# Patient Record
Sex: Male | Born: 1940 | Race: Black or African American | Hispanic: No | Marital: Married | Smoking: Never smoker
Health system: Southern US, Community
[De-identification: ages and names within clinical notes are randomized; demographics above are authoritative.]

## PROBLEM LIST (undated history)

## (undated) DIAGNOSIS — M1990 Unspecified osteoarthritis, unspecified site: Secondary | ICD-10-CM

## (undated) DIAGNOSIS — C801 Malignant (primary) neoplasm, unspecified: Secondary | ICD-10-CM

## (undated) DIAGNOSIS — R112 Nausea with vomiting, unspecified: Secondary | ICD-10-CM

## (undated) DIAGNOSIS — Z9889 Other specified postprocedural states: Secondary | ICD-10-CM

## (undated) DIAGNOSIS — R350 Frequency of micturition: Secondary | ICD-10-CM

## (undated) DIAGNOSIS — J45909 Unspecified asthma, uncomplicated: Secondary | ICD-10-CM

## (undated) DIAGNOSIS — Z923 Personal history of irradiation: Secondary | ICD-10-CM

## (undated) DIAGNOSIS — I10 Essential (primary) hypertension: Secondary | ICD-10-CM

## (undated) HISTORY — PX: FRACTURE SURGERY: SHX138

## (undated) HISTORY — PX: JOINT REPLACEMENT: SHX530

## (undated) HISTORY — PX: TONSILLECTOMY: SUR1361

---

## 1997-10-07 ENCOUNTER — Encounter: Payer: Self-pay | Admitting: Emergency Medicine

## 1997-10-07 ENCOUNTER — Emergency Department (HOSPITAL_COMMUNITY): Admission: EM | Admit: 1997-10-07 | Discharge: 1997-10-07 | Payer: Self-pay | Admitting: Emergency Medicine

## 1998-10-27 ENCOUNTER — Encounter: Payer: Self-pay | Admitting: Cardiology

## 1998-10-27 ENCOUNTER — Ambulatory Visit (HOSPITAL_COMMUNITY): Admission: RE | Admit: 1998-10-27 | Discharge: 1998-10-27 | Payer: Self-pay | Admitting: Cardiology

## 2001-03-21 ENCOUNTER — Ambulatory Visit (HOSPITAL_COMMUNITY): Admission: RE | Admit: 2001-03-21 | Discharge: 2001-03-21 | Payer: Self-pay | Admitting: Cardiology

## 2001-03-21 ENCOUNTER — Encounter: Payer: Self-pay | Admitting: Cardiology

## 2002-01-28 ENCOUNTER — Encounter: Payer: Self-pay | Admitting: Emergency Medicine

## 2002-01-28 ENCOUNTER — Emergency Department (HOSPITAL_COMMUNITY): Admission: EM | Admit: 2002-01-28 | Discharge: 2002-01-28 | Payer: Self-pay | Admitting: Emergency Medicine

## 2003-04-13 ENCOUNTER — Ambulatory Visit (HOSPITAL_COMMUNITY): Admission: RE | Admit: 2003-04-13 | Discharge: 2003-04-13 | Payer: Self-pay | Admitting: Cardiology

## 2003-04-16 ENCOUNTER — Ambulatory Visit (HOSPITAL_BASED_OUTPATIENT_CLINIC_OR_DEPARTMENT_OTHER): Admission: RE | Admit: 2003-04-16 | Discharge: 2003-04-16 | Payer: Self-pay | Admitting: Cardiology

## 2003-12-17 ENCOUNTER — Ambulatory Visit (HOSPITAL_BASED_OUTPATIENT_CLINIC_OR_DEPARTMENT_OTHER): Admission: RE | Admit: 2003-12-17 | Discharge: 2003-12-17 | Payer: Self-pay | Admitting: Cardiology

## 2004-09-22 ENCOUNTER — Encounter: Admission: RE | Admit: 2004-09-22 | Discharge: 2004-09-22 | Payer: Self-pay | Admitting: Orthopedic Surgery

## 2005-07-03 ENCOUNTER — Inpatient Hospital Stay (HOSPITAL_COMMUNITY): Admission: RE | Admit: 2005-07-03 | Discharge: 2005-07-11 | Payer: Self-pay | Admitting: Orthopedic Surgery

## 2005-07-06 ENCOUNTER — Ambulatory Visit: Payer: Self-pay | Admitting: Cardiology

## 2005-07-06 ENCOUNTER — Encounter: Payer: Self-pay | Admitting: Cardiology

## 2005-07-09 ENCOUNTER — Encounter: Payer: Self-pay | Admitting: Vascular Surgery

## 2005-07-09 ENCOUNTER — Ambulatory Visit: Payer: Self-pay | Admitting: Physical Medicine & Rehabilitation

## 2006-05-16 ENCOUNTER — Encounter: Admission: RE | Admit: 2006-05-16 | Discharge: 2006-05-16 | Payer: Self-pay | Admitting: Orthopedic Surgery

## 2007-04-08 ENCOUNTER — Encounter: Admission: RE | Admit: 2007-04-08 | Discharge: 2007-04-08 | Payer: Self-pay | Admitting: Orthopedic Surgery

## 2007-11-11 ENCOUNTER — Ambulatory Visit (HOSPITAL_COMMUNITY): Admission: RE | Admit: 2007-11-11 | Discharge: 2007-11-11 | Payer: Self-pay | Admitting: Cardiology

## 2007-11-11 ENCOUNTER — Encounter (INDEPENDENT_AMBULATORY_CARE_PROVIDER_SITE_OTHER): Payer: Self-pay | Admitting: Cardiology

## 2007-11-11 ENCOUNTER — Ambulatory Visit: Payer: Self-pay | Admitting: Vascular Surgery

## 2009-09-02 ENCOUNTER — Ambulatory Visit: Admission: RE | Admit: 2009-09-02 | Discharge: 2009-09-23 | Payer: Self-pay | Admitting: Radiation Oncology

## 2010-05-19 NOTE — Discharge Summary (Signed)
NAMELYNDLE, PANG               ACCOUNT NO.:  [id number]   MEDICAL RECORD NO.:  [id number]          PATIENT TYPE:  INP   LOCATION:  5037                         FACILITY:  [facility]   PHYSICIAN:  Elana Alm. Thurston Hole, M.D. 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   HISTORY OF PRESENT ILLNESS:  The patient is a 70 year old black male with a  history of end-stage DJD of his right hip.  He has failed conservative care,  including anti-inflammatories and articular cortisone injections, without  success.  At this point in time, he has pain at night, pain at rest, pain  unrelieved by anti-inflammatories.  He understands the risks, benefits, and  possible complications of a right total hip replacement and is without  question.   PROCEDURES IN HOUSE:  On July 03, 2005, the patient underwent a right total  hip replacement by Dr. Thurston Hole.  He tolerated the procedure well and was  admitted for pain control, DVT prophylaxis, and physical therapy.  Postop  day #1, he had minimal pain.  He was very sleepy.  T-max was 102.4.  He was  metabolically stable.  O2 sats were 100% on 2 liters.  His wound was cleaned  and dressed.  He did have some weakness in his peroneal tendon evertors, but  had functioning FHL, EHL, and brisk capillary refill.  The patient was given  a fluid bolus, as his blood pressure dropped during physical therapy.  It  was noted that his wife gave him his blood pressure medicine last  night  because it was not given by the nurses because he was hypotensive.  We had a  long discussion with his wife that it is very important while he is in the  hospital that the nurses be the only one who give his blood pressure  medicine to him.  Postop day #2, T-max of 101.2, hemoglobin 10.9, potassium  3.4.  UA did show some blood and a few bacteria in it.  We will repeat this  as a clean catch.  He was alert and oriented today and only had a small  amount of drainage from his wound.  Bowel sounds were distant.  The peroneal  muscles were intact, but still weak.  His hypotension improved with fluids.  Postop day #3, he had a mild amount of pain.  He complained of an occasional  headache.  He has no visual changes and no headache now.  T-max of 100.5,  hemoglobin was 10.2.  UA reveals rare bacteria.  He was alert and oriented  x3.  Perineal function is significantly improved.  Postop day #3, in the  afternoon, neuro was  consulted due to visual changes, left leg weakness,  left arm weakness, and headaches.  Neuro was concerned about it being a TIA.  They ordered an EKG and echo, MRI/MRA, and orthostatics.  His staples were  removed from his surgical incision so the MRI and MRA of his hip could be  done without difficulty.  His hemoglobin was 10.4.  His potassium was 3.3.  His INR is 1.1.  The right leg surgical wound is well-approximated and no  right leg weakness.  He still complains of stiffness.  He does have an  elevated sed rate and an elevated CK.  Postop day #5, T-max of 103.  His  surgical wound is well-approximated.  He does have visual changes when he  looks to the right.  Postop day #6, the patient is alert and oriented,  progressing slowly with physical therapy, T-max of 100, O2 sat of 98%.  Surgical wound is well-approximated.  Rehab consult was ordered.  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 Shana Chute, his regular  medical doctor, was consulted.  Carotid Dopplers show 60-80% carotid stenosis on the right and 40-60%  carotid stenosis on the left.  Neurology saw him and recommended following  conservatively at this time.  Rehab was consulted and felt he was not a  candidate for inpatient rehab, due to his significant assist at home.  Dr.  Shana Chute, his regular medical doctor, saw him and ordered a CPAP machine.  The patient declined the CPAP machine, as he never uses it.  T-max of 100.4,  temperature on exam was 98.2, sed rate 86, CK is 908, hemoglobin is 10.2.  He is metabolically stable.  The patient requested a wheelchair for home  use.  This was declined because we wanted him ambulatory and not stationary.  He was discharged to home on postop day #8 in stable condition with follow-  up scheduled with his regular medical doctor, Dr. Shana Chute, with the  neurologist, and with the rheumatologist.  He has been instructed to follow  his total hip cautions.  He is 50% weightbearing.  He is on a regular diet.   DISCHARGE MEDICATIONS:  Tylenol 325 mg 2 tablets every 6 hours for pain, Oxy-  IR 5 mg 1 tablet every 4 hours as needed for pain, Lovenox 30 mg subcu daily  for 20 days.  His wife has been instructed not to give him his Diovan unless  his blood pressure is greater than 140/90.  He has been instructed to take  Colace 100 mg 1 tablet twice a day.   Follow up with Dr. Shana Chute in 1 week, Dr. Phylliss Bob in 1-2 weeks, Dr. Nash Shearer in  2 weeks.  He will have [facility]  He was discharged to home in  stable condition with his follow-up appointment with Dr. Thurston Hole on July 17, 2005, at 9:30 in the morning.      Kirstin Shepperson, P.A.      Robert A. Thurston Hole, M.D.  Electronically Signed    KS/MEDQ  D:  09/05/2005  T:  09/05/2005  Job:  [id number]

## 2010-05-19 NOTE — Consult Note (Signed)
Dustin Lucas, Dustin Lucas               ACCOUNT NO.:  [id number]   MEDICAL RECORD NO.:  [id number]          PATIENT TYPE:  INP   LOCATION:  5037                         FACILITY:  [facility]   PHYSICIAN:  Bevelyn Buckles. Champey, M.D.DATE OF BIRTH:  1940/06/05   DATE OF CONSULTATION:  DATE OF DISCHARGE:                                   CONSULTATION   REQUESTING PHYSICIAN:  Dr. Thurston Hole.   REASON FOR CONSULTATION:  TIA and left-sided weakness.   HISTORY OF PRESENT ILLNESS:  Dustin Lucas is a 70 year old African-American  male with a past medical history of hypertension, sleep apnea and recent  right hip replacement who postop day one developed dizziness, headaches and  blurry vision and was found to be severely hypertensive with orthostatic  hypotension.  This morning the patient developed left-sided weakness  primarily in the left lower extremity that lasted anywhere from 20 to 60  minutes in duration and resolved on its own.  The patient denies any  numbness or tingling during this time, vision changes, speech changes,  swallowing problems, chewing problems, vertigo, dizziness or loss of  consciousness.  He is currently back to his baseline prior to his surgery.  He occasionally complains of some tiredness in his muscles and fatigability  at times, primarily in his upper arms.   PAST MEDICAL HISTORY:  Positive for sleep apnea, hypertension, BPH and right  hip replacement.   CURRENT MEDICATIONS:  Include Diovan.   ALLERGIES:  THE PATIENT HAS NO KNOWN DRUG ALLERGIES.   FAMILY HISTORY:  Positive for stroke.   SOCIAL HISTORY:  The patient lives with his wife.  He denies any alcohol or  tobacco use.   REVIEW OF SYSTEMS:  Positive as per HPI.  Review of systems negative as per  HPI and greater than nine other organ systems.   PHYSICAL EXAMINATION:  VITAL SIGNS:  Temperature is 99.2, blood pressure  115/66, respirations 20, O2 saturation 97% on room air.  HEENT:  Normocephalic, atraumatic.   Extraocular muscles are intact.  Pupils  are equal.  NECK:  Supple.  No carotid bruits.  HEART:  Regular.  LUNGS:  Clear.  ABDOMEN:  Soft, nontender.  EXTREMITIES:  No edema with good pulses.  NEUROLOGICAL:  The patient is awake, alert and oriented times three, memory  is within normal limits.  Cranial nerves II-XII are grossly intact.  Motor  examination shows 5/5 strength in bilateral upper extremities bilaterally  without fatigability.  Right lower extremity I am unable to assess secondary  to recent surgery and having a soft cast on.  The left lower extremity has  good strength.  Possible slight proximal weakness is noted.  He has good  tone in all four extremities.  Sensory examination is within normal limits  to light touch.  Reflexes are 1-2+ throughout and symmetric.  Tone is  neutral bilaterally.  Cerebellar function is within normal limits, finger to  nose.  Gait was not assessed in this  clinic visit at this state secondary  to recent surgery.   LABORATORY:  WBC 5.9, hemoglobin 10.2, hematocrit 30.2, platelets 104.  Sodium  142, potassium 3.7, chloride 110.  The CO2 is 27, BUN 5, creatinine  1.1, glucose 113.   IMPRESSION:  This is a 70 year old African-American male with multiple  medical problems and recent right hip replacement, who developed left lower  extremity weakness transiently this a.m.  With a history of hypertension  yesterday, the patient possibly could have suffered a transient ischemic  attack.  We are recommending getting an MRI/MRA at the hospital secondary to  his recent hip surgery.  Upon speaking with orthopedics, the hip replacement  is metal and unsure whether it is compatible.  We will order a head CT for  now.  We will check carotid Dopplers and 2-D echo.  We may need two images  of the lower spine in the future.  We will also check lipids, homocysteine  level and hemoglobin A1c (as his fasting sugars have been slightly  elevated).  We will place  the patient on a baby aspirin daily and get PT/OT  consult, and the patient should be on DVT prophylaxis.  We will follow the  patient while he is in the hospital.      Bevelyn Buckles. Nash Shearer, M.D.  Electronically Signed     DRC/MEDQ  D:  07/06/2005  T:  07/06/2005  Job:  [id number]

## 2010-05-19 NOTE — Op Note (Signed)
Dustin Lucas, Dustin Lucas               ACCOUNT NO.:  [id number]   MEDICAL RECORD NO.:  [id number]          PATIENT TYPE:  INP   LOCATION:  2899                         FACILITY:  [facility]   PHYSICIAN:  Elana Alm. Thurston Hole, M.D. DATE OF BIRTH:  October 28, 1940   DATE OF PROCEDURE:  07/03/2005  DATE OF DISCHARGE:                                 OPERATIVE REPORT   PREOPERATIVE DIAGNOSIS:  Right hip degenerative joint disease.   POSTOPERATIVE DIAGNOSIS:  Right hip degenerative joint disease.   PROCEDURE:  Right total hip replacement using S/ROM cementless total hip  system with 22 x 17 femoral stem with 36 + 8 femoral neck with 22D small  sleeve with 51 mm ASR femoral head.  Acetabular component 58 mm ASR Press-  Fit acetabulum.   SURGEON:  Elana Alm. Thurston Hole, M.D.   ASSISTANT:  Julien Girt, P.A.   ANESTHESIA:  General.   OPERATIVE TIME:  One hour, 45 minutes.   ESTIMATED BLOOD LOSS:  350 cc.   COMPLICATIONS:  None.   DESCRIPTION OF PROCEDURE:  Mr. Miyasato was brought to the operating room on  July 03, 2005 and placed on the operative table in supine position.  He  received Ancef 1 gm IV preoperatively for prophylaxis.  After being placed  under general anesthesia, he had a Foley catheter placed under sterile  conditions.  His right hip was examined.  He had flexion to 90, extension to  0, internal and external rotation of 20 degrees with minimal leg length  discrepancy noted.  He was then turned in the right lateral up-decubitus  position and secured on the bed with the Stohlberg frame.  His right hip and  leg were then prepped using sterile DuraPrep and draped using a sterile  technique.  Originally through a 15 cm posterolateral greater trochanteric  incision, initial exposure was then made.  Then lateral subcutaneous tissues  were incised in line with the skin incision.  The iliotibial band and  gluteus maximus fascia was incised longitudinally, revealing the underlying  sciatic  nerve, which was carefully protected.  Short external rotators of  the hip and hip capsule were released off the femoral neck insertion and  tagged, and the hip was then posteriorly dislocated.  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  The femoral neck cut was made 2.5 cm above the lesser trochanter.  After  this was done, the carefully placed acetabular retractors were placed around  the acetabulum.  The acetabulum was found to have grade 3 and 4  chondromalacia as well.  Sequential acetabular reaming was then carried out,  up to a 57 mm size in the appropriate amount of anteversion abduction  inclination, and then a 58 mm trial cup was placed, hammered into position  with an excellent fit.  It was then removed, and the actual 58 mm ASR cup  was hammered into position, and the appropriate amount of anteversion,  abduction inclination with an excellent [employer].  After this was done,  then attention was turned  back to the proximal femur.  Sequential distal  femoral canal reaming was carried out to a 17.5, and then with a 17.5 trial  in place, the 22D proximal reaming was carried out.  After this was done,  then the calcar reamer was used and reamed to a size small.  After this was  done, then the 22D small sleeve trial was placed, and then with a 22 x 17  femoral component and a 36 + 8 off-set femoral neck, the femoral trial was  hammered into position, and an extra 10 degrees of anteversion placed into  this trial component, and then the 51 + 0 femoral head was placed onto the  femoral neck and stem trial.  The hip reduced, taken through a full range of  motion, found to be stable and leg lengths equal.  Stable up to 80-90  degrees of internal rotation in both neutral and 30 degrees of adduction and  also stable in abduction and external rotation.  Leg lengths equalized.  At  this point, the femoral trials were removed.  The  femoral canal was  irrigated, and then the actual 22D small sleeve was hammered into position  with an excellent fit, and then the 22 x 17 femoral stem with a 36+8 femoral  neck off-set was hammered into position through the femoral sleeve with a 10  degree additional anteversion placed into this and then the 51 mm +0 ASR  femoral head was hammered onto the femoral neck with an excellent Morse  taper fit.  The hip then reduced, taken through a full range of motion and  found to be stable, and leg lengths equal.  At this point, it is felt that  all of the components were of excellent size, fit, and stability.  The wound  was further irrigated.  The short external rotators of the hip and hip  capsule were then reattached through their femoral neck insertion through  two drill holes in the greater trochanter.  Gluteus maximus tendon was  closed with #1 Ethibond suture.  The iliotibial band and gluteus maximus  fascia was closed with a #1 Ethibond suture.  Subcutaneous tissues were  closed with 0 and 2-0 Vicryl.  The subcuticular layer was closed with 2-0  Vicryl.  The skin was closed with skin staples.  Sterile dressings were  applied.  The patient then had a knee immobilizer placed.  He was turned  supine, checked for leg lengths that were equal.  Rotation was equal.  Pulses 2+ and symmetric.  He was then awakened, extubated, and taken to the  recovery room in stable condition.  Needle and sponge counts were correct x2  at the end of the case.      Robert A. Thurston Hole, M.D.  Electronically Signed     RAW/MEDQ  D:  07/03/2005  T:  07/03/2005  Job:  [id number]

## 2010-05-19 NOTE — Procedures (Signed)
NAME:  AUGIE, VANE               ACCOUNT NO.:  [id number]   MEDICAL RECORD NO.:  [id number]          PATIENT TYPE:  OUT   LOCATION:  SLEEP CENTER                 [facility]ACILITY[facility]   PHYSICIAN:  Clinton D. Maple Hudson, M.D. DATE OF BIRTH:  Jun 22, 1940   DATE OF STUDY:  12/17/2003                              NOCTURNAL POLYSOMNOGRAM   STUDY DATE:  December 17, 2003   REFERRING PHYSICIAN:  Osvaldo Shipper. Spruill, M.D.   INDICATION FOR STUDY:  Hypersomnia with sleep apnea.  CPAP titration  requested.  Baseline NPSG on April 16, 2003 recorded an RDI of 27/hr and a  PLMA of 2.2/hr.   EPWORTH SLEEPINESS SCORE:  8/24   NECK SIZE:  16-1/2 inches   BODY MASS INDEX:  28.5   WEIGHT:  230 pounds   SLEEP ARCHITECTURE:  Total sleep time 305 minutes with sleep efficiency 68%.  Stage I was 14%, stage II 78%, stages III and IV were absent, REM was 8% of  total sleep time.  Sleep latency was 29 minutes, REM latency 109 minutes,  awake after sleep onset 115 minutes, arousal index increased at 70.  No  sleep medication taken.   RESPIRATORY DATA:  CPAP titration protocol.  CPAP was titrated to 11 CWP,  RDI 1.2/hr using a ResMed Swift with large nasal pillows, chin strap, and  humidifier.   OXYGEN DATA:  Mild snoring before CPAP control with oxygen saturation on  CPAP maintained 96-97%.   CARDIAC DATA:  Normal sinus rhythm with PACs.   MOVEMENT/PARASOMNIA:  A total of 508 limb jerks were recorded of which 146  were associated with arousal or awakening for a significant periodic limb  movement with arousal index of 28.7/hr.   IMPRESSION/RECOMMENDATION:  Successful continuous positive airway pressure  titration to 11 CWP, respiratory disturbance index 1.2/hr using a ResMed  Swift with large nasal pillows, chin strap, and humidifier.  Diagnostic NPSG  on April 16, 2003 had recorded an RDI of 27/hr with a periodic limb movement  with arousal index of 2.2/hr.  Periodic limb movement was much more  significant on the present study night with a PLMA of 28.7/hr.  This may  reflect stimulation by the presence of the continuous positive airway  pressure mask.  This can be  re-evaluated clinically after time to adjust to continuous positive airway  pressure at home.  It may help to provide a sleep medication initially.                                                           Clinton D. Maple Hudson, M.D.  Diplomate, [employer] Board   CDY/MEDQ  D:  12/26/2003 12:37:10  T:  12/26/2003 19:53:25  Job:  [id number]

## 2011-01-29 ENCOUNTER — Other Ambulatory Visit (HOSPITAL_COMMUNITY): Payer: Self-pay | Admitting: Cardiology

## 2011-02-05 ENCOUNTER — Encounter (HOSPITAL_COMMUNITY)
Admission: RE | Admit: 2011-02-05 | Discharge: 2011-02-05 | Disposition: A | Discharge status: Home / Self Care | Payer: Medicare Other | Source: Ambulatory Visit | Attending: Cardiology | Admitting: Cardiology

## 2011-02-05 ENCOUNTER — Ambulatory Visit (HOSPITAL_COMMUNITY)
Admission: RE | Admit: 2011-02-05 | Discharge: 2011-02-05 | Disposition: A | Discharge status: Home / Self Care | Payer: Medicare Other | Source: Ambulatory Visit | Attending: Cardiology | Admitting: Cardiology

## 2011-02-05 DIAGNOSIS — I10 Essential (primary) hypertension: Secondary | ICD-10-CM | POA: Insufficient documentation

## 2011-02-05 DIAGNOSIS — R9431 Abnormal electrocardiogram [ECG] [EKG]: Secondary | ICD-10-CM | POA: Insufficient documentation

## 2011-02-05 DIAGNOSIS — R079 Chest pain, unspecified: Secondary | ICD-10-CM | POA: Insufficient documentation

## 2011-02-05 LAB — HEPATIC FUNCTION PANEL
AST: 24 U/L (ref 0–37)
Albumin: 3.5 g/dL (ref 3.5–5.2)
Indirect Bilirubin: 0.6 mg/dL (ref 0.3–0.9)
Total Bilirubin: 0.7 mg/dL (ref 0.3–1.2)

## 2011-02-05 LAB — LIPID PANEL
Cholesterol: 167 mg/dL (ref 0–200)
LDL Cholesterol: 82 mg/dL (ref 0–99)

## 2011-02-05 LAB — BASIC METABOLIC PANEL
GFR calc Af Amer: >90 mL/min (ref >90)
GFR calc non Af Amer: 86 mL/min — ABNORMAL LOW (ref >90)
Glucose, Bld: 85 mg/dL (ref 70–99)

## 2011-02-05 MED ORDER — TECHNETIUM TC 99M TETROFOSMIN IV KIT
30.0000 | PACK | Freq: Once | INTRAVENOUS | Status: AC | PRN
  Administered 2011-02-05: 30 via INTRAVENOUS

## 2011-02-05 MED ORDER — TECHNETIUM TC 99M TETROFOSMIN IV KIT
10.0000 | PACK | Freq: Once | INTRAVENOUS | Status: AC | PRN
  Administered 2011-02-05: 10 via INTRAVENOUS

## 2011-02-05 MED ORDER — REGADENOSON 0.4 MG/5ML IV SOLN
INTRAVENOUS | Status: AC
  Administered 2011-02-05: 0.4 mg
  Filled 2011-02-05: qty 5

## 2014-07-01 ENCOUNTER — Other Ambulatory Visit: Payer: Self-pay | Admitting: Orthopedic Surgery

## 2014-07-29 ENCOUNTER — Encounter (HOSPITAL_COMMUNITY)
Admission: RE | Admit: 2014-07-29 | Discharge: 2014-07-29 | Disposition: A | Discharge status: Home / Self Care | Payer: Medicare PPO | Source: Ambulatory Visit | Attending: Orthopedic Surgery | Admitting: Orthopedic Surgery

## 2014-07-29 ENCOUNTER — Ambulatory Visit (HOSPITAL_COMMUNITY)
Admission: RE | Admit: 2014-07-29 | Discharge: 2014-07-29 | Disposition: A | Discharge status: Home / Self Care | Payer: Medicare PPO | Source: Ambulatory Visit | Attending: Orthopedic Surgery | Admitting: Orthopedic Surgery

## 2014-07-29 ENCOUNTER — Encounter (HOSPITAL_COMMUNITY): Payer: Self-pay

## 2014-07-29 DIAGNOSIS — Z01818 Encounter for other preprocedural examination: Secondary | ICD-10-CM | POA: Diagnosis not present

## 2014-07-29 DIAGNOSIS — Z01812 Encounter for preprocedural laboratory examination: Secondary | ICD-10-CM | POA: Insufficient documentation

## 2014-07-29 DIAGNOSIS — Z0183 Encounter for blood typing: Secondary | ICD-10-CM | POA: Diagnosis not present

## 2014-07-29 HISTORY — DX: Malignant (primary) neoplasm, unspecified: C80.1

## 2014-07-29 HISTORY — DX: Frequency of micturition: R35.0

## 2014-07-29 HISTORY — DX: Unspecified osteoarthritis, unspecified site: M19.90

## 2014-07-29 HISTORY — DX: Essential (primary) hypertension: I10

## 2014-07-29 HISTORY — DX: Nausea with vomiting, unspecified: R11.2

## 2014-07-29 HISTORY — DX: Other specified postprocedural states: Z98.890

## 2014-07-29 LAB — CBC WITH DIFFERENTIAL/PLATELET
Basophils Absolute: 0 10*3/uL (ref 0.0–0.1)
Basophils Relative: 0 % (ref 0–1)
EOS PCT: 1 % (ref 0–5)
Eosinophils Absolute: 0 10*3/uL (ref 0.0–0.7)
HCT: 45.1 % (ref 39.0–52.0)
Hemoglobin: 15.2 g/dL (ref 13.0–17.0)
LYMPHS ABS: 1.9 10*3/uL (ref 0.7–4.0)
Lymphocytes Relative: 35 % (ref 12–46)
MCH: 32.1 pg (ref 26.0–34.0)
MCHC: 33.7 g/dL (ref 30.0–36.0)
MCV: 95.3 fL (ref 78.0–100.0)
Monocytes Absolute: 0.4 10*3/uL (ref 0.1–1.0)
Monocytes Relative: 7 % (ref 3–12)
NEUTROS ABS: 2.9 10*3/uL (ref 1.7–7.7)
NEUTROS PCT: 57 % (ref 43–77)
Platelets: 172 10*3/uL (ref 150–400)
RBC: 4.73 MIL/uL (ref 4.22–5.81)
RDW: 14.2 % (ref 11.5–15.5)
WBC: 5.2 10*3/uL (ref 4.0–10.5)

## 2014-07-29 LAB — URINALYSIS, ROUTINE W REFLEX MICROSCOPIC
Bilirubin Urine: NEGATIVE
Glucose, UA: NEGATIVE mg/dL
HGB URINE DIPSTICK: NEGATIVE
KETONES UR: NEGATIVE mg/dL
LEUKOCYTES UA: NEGATIVE
NITRITE: NEGATIVE
PH: 6 (ref 5.0–8.0)
PROTEIN: 30 mg/dL — AB
Specific Gravity, Urine: 1.02 (ref 1.005–1.030)
Urobilinogen, UA: 1 mg/dL (ref 0.0–1.0)

## 2014-07-29 LAB — URINE MICROSCOPIC-ADD ON

## 2014-07-29 LAB — TYPE AND SCREEN
ABO/RH(D): B POS
ANTIBODY SCREEN: NEGATIVE

## 2014-07-29 LAB — PROTIME-INR
INR: 1.1 (ref 0.00–1.49)
PROTHROMBIN TIME: 14.4 s (ref 11.6–15.2)

## 2014-07-29 LAB — BASIC METABOLIC PANEL
Anion gap: 7 (ref 5–15)
BUN: 9 mg/dL (ref 6–20)
CO2: 26 mmol/L (ref 22–32)
Calcium: 9 mg/dL (ref 8.9–10.3)
Chloride: 108 mmol/L (ref 101–111)
Creatinine, Ser: 1.05 mg/dL (ref 0.61–1.24)
GFR calc Af Amer: >60 mL/min (ref >60)
Glucose, Bld: 92 mg/dL (ref 65–99)
Potassium: 3.6 mmol/L (ref 3.5–5.1)
Sodium: 141 mmol/L (ref 135–145)

## 2014-07-29 LAB — SURGICAL PCR SCREEN
MRSA, PCR: NEGATIVE
Staphylococcus aureus: NEGATIVE

## 2014-07-29 LAB — APTT: APTT: 35 s (ref 24–37)

## 2014-07-29 NOTE — Pre-Procedure Instructions (Signed)
    Dustin Lucas  07/29/2014      [id number] - [place], [place] [address] [place] [address] Phone: [phone] Fax: [phone]    Your procedure is scheduled on 08-09-2014  Monday    Report to [facility] Admitting at 5:30 A.M.   Call this number if you have problems the morning of surgery:  [phone]   Remember:  Do not eat food or drink liquids after midnight.   Take these medicines the morning of surgery with A SIP OF WATER amlodipine-valsartan,avodart    Do not wear jewelry,   Do not wear lotions, powders, or perfumes.  .  Do not shave 48 hours prior to surgery.  Men may shave face and neck.   Do not bring valuables to the hospital.  [facility] is not responsible for any belongings or valuables.  Contacts, dentures or bridgework may not be worn into surgery.  Leave your suitcase in the car.  After surgery it may be brought to your room.  For patients admitted to the hospital, discharge time will be determined by your treatment team.  Patients discharged the day of surgery will not be allowed to drive home.    Special instructions:  See attached sheet "Preparing for Surgery" for instructions of CHG shower  Please read over the following fact sheets that you were given. Pain Booklet, Coughing and Deep Breathing, Blood Transfusion Information and Surgical Site Infection Prevention

## 2014-07-30 NOTE — Progress Notes (Addendum)
Anesthesia Chart Review:  Pt is 74 year old male scheduled for R total hip revision on 08/08/2013 with Dr. Mayer Camel.   PCP is Dr. Iona Beard [name]. Cardiologist is Dr. Terrence Dupont, last office visit 05/10/14.   PMH includes: HTN, prostate cancer. Current smoker. BMI 29.   Anesthesia history includes post op nausea and vomiting.   Medications include: amlodipine-valsartan, ASA, avodart.   Preoperative labs reviewed.    Chest x-ray 07/29/2014 reviewed. No active cardiopulmonary disease.   EKG 07/08/2014: sinus rhythm. 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 Anterolateral ST elevation- repolarization variant. Nonspecific T wave abnormality.    Echo 02/14/2011: -Normal LV size and systolic function with EF 50% -LVH with normal diastolic function noted -mild aortic regurgitation -trace tricuspid regurgitation -No intracardiac shunting is found -No intracardiac masses or thrombi are seen -No pericardial effusion is seen with no suggestion of pericardial tamponade  Nuclear stress test 02/05/2011: 1. Focal area of inferior wall scar. 2. No findings for myocardial ischemia. 3. Estimated ejection fraction is 59%.  Reviewed case with Dr. Veatrice Kells. EKG looks similar to tracing from 2013 (associated with stress test) and from 2007 (all on paper chart).   If no changes, I anticipate pt can proceed with surgery as scheduled.   [name], FNP-BC [facility]/Anesthesiology Phone: [phone] 08/05/2014 4:44 PM

## 2014-08-04 NOTE — H&P (Signed)
TOTAL HIP REVISION ADMISSION H&P  Patient is admitted for right revision total hip arthroplasty.  Subjective:  Chief Complaint: right hip pain  HPI: Dustin Lucas, 74 y.o. male, has a history of pain and functional disability in the right hip due to arthritis and patient has failed non-surgical conservative treatments for greater than 12 weeks to include NSAID's and/or analgesics, flexibility and strengthening excercises, use of assistive devices, weight reduction as appropriate and activity modification. The indications for the revision total hip arthroplasty are bearing surface wear leading to  symptomatic synovitis.  Onset of symptoms was gradual starting 9 years ago with gradually worsening course since that time.  Prior procedures on the right hip include arthroplasty.  Patient currently rates pain in the right hip at 10 out of 10 with activity.  There is night pain, worsening of pain with activity and weight bearing, pain that interfers with activities of daily living and pain with passive range of motion.  This condition presents safety issues increasing the risk of falls.   There is no current active infection.  There are no active problems to display for this patient.  Past Medical History  Diagnosis Date  . PONV (postoperative nausea and vomiting)   . Hypertension   . Urinary frequency   . Arthritis   . Cancer     prostate cancer    Past Surgical History  Procedure Laterality Date  . Joint replacement      hip replacement  right  . Fracture surgery Left     plate in ankle  . Tonsillectomy      No prescriptions prior to admission   No Known Allergies  History  Substance Use Topics  . Smoking status: Current Every Day Smoker    Types: Cigarettes  . Smokeless tobacco: Not on file  . Alcohol Use: No    No family history on file.    Review of Systems  Constitutional: Negative.   HENT: Negative.   Eyes: Negative.   Cardiovascular: Positive for leg swelling.   Htn  Gastrointestinal: Positive for heartburn.  Genitourinary:       Poor bladder control and weak stream, enlarged prostate  Musculoskeletal: Positive for joint pain.  Skin: Negative.   Neurological: Negative.   Endo/Heme/Allergies: Negative.   Psychiatric/Behavioral: Negative.     Objective:  Physical Exam  Constitutional: He is oriented to person, place, and time. He appears well-developed and well-nourished.  HENT:  Head: Normocephalic and atraumatic.  Eyes: Pupils are equal, round, and reactive to light.  Neck: Normal range of motion.  Cardiovascular: Intact distal pulses.   Respiratory: Effort normal.  Musculoskeletal: He exhibits tenderness.  he has mild pain with hip flexion extension internal/external rotation and log roll.  Increased pain with heel bump.  Mild tenderness in the right groin and right buttock.  He has brisk capillary refill and is neurovascularly intact distally.  Neurological: He is alert and oriented to person, place, and time.  Skin: Skin is warm and dry.  Psychiatric: He has a normal mood and affect. His behavior is normal. Judgment and thought content normal.    Vital signs in last 24 hours:     Labs:   There is no height or weight on file to calculate BMI.  Imaging Review:  Plain radiographs demonstrate AP of the pelvis and one view of the right hip are taken and reviewed in office today.  There may be a small lucent halo around the acetabular component.  Assessment/Plan:  End stage  arthritis, right hip(s) with failed previous arthroplasty.  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 Revision total hip arthroplasty is deemed medically necessary. The treatment options including medical management, injection therapy, arthroscopy and arthroplasty were discussed at length. The risks  and benefits of total hip arthroplasty were presented and reviewed. The risks due to aseptic loosening, infection, stiffness, dislocation/subluxation,  thromboembolic complications and other imponderables were discussed.  The patient acknowledged the explanation, agreed to proceed with the plan and consent was signed. Patient is being admitted for inpatient treatment for surgery, pain control, PT, OT, prophylactic antibiotics, VTE prophylaxis, progressive ambulation and ADL's and discharge planning. The patient is planning to be discharged home with home health services

## 2014-08-09 ENCOUNTER — Encounter (HOSPITAL_COMMUNITY): Payer: Self-pay | Admitting: *Deleted

## 2014-08-09 ENCOUNTER — Inpatient Hospital Stay (HOSPITAL_COMMUNITY): Payer: Medicare PPO

## 2014-08-09 ENCOUNTER — Inpatient Hospital Stay (HOSPITAL_COMMUNITY)
Admission: RE | Admit: 2014-08-09 | Discharge: 2014-08-11 | DRG: 468 | Disposition: A | Discharge status: Skilled Nursing Facility | Payer: Medicare PPO | Source: Ambulatory Visit | Attending: Orthopedic Surgery | Admitting: Orthopedic Surgery

## 2014-08-09 ENCOUNTER — Inpatient Hospital Stay (HOSPITAL_COMMUNITY): Payer: Medicare PPO | Admitting: Anesthesiology

## 2014-08-09 ENCOUNTER — Inpatient Hospital Stay (HOSPITAL_COMMUNITY): Payer: Medicare PPO | Admitting: Vascular Surgery

## 2014-08-09 ENCOUNTER — Encounter (HOSPITAL_COMMUNITY)
Admission: RE | Disposition: A | Discharge status: Skilled Nursing Facility | Payer: Self-pay | Source: Ambulatory Visit | Attending: Orthopedic Surgery

## 2014-08-09 DIAGNOSIS — T84090A Other mechanical complication of internal right hip prosthesis, initial encounter: Principal | ICD-10-CM | POA: Diagnosis present

## 2014-08-09 DIAGNOSIS — Z8546 Personal history of malignant neoplasm of prostate: Secondary | ICD-10-CM

## 2014-08-09 DIAGNOSIS — F17210 Nicotine dependence, cigarettes, uncomplicated: Secondary | ICD-10-CM | POA: Diagnosis present

## 2014-08-09 DIAGNOSIS — I10 Essential (primary) hypertension: Secondary | ICD-10-CM | POA: Diagnosis present

## 2014-08-09 DIAGNOSIS — Y831 Surgical operation with implant of artificial internal device as the cause of abnormal reaction of the patient, or of later complication, without mention of misadventure at the time of the procedure: Secondary | ICD-10-CM | POA: Diagnosis present

## 2014-08-09 DIAGNOSIS — Z96649 Presence of unspecified artificial hip joint: Secondary | ICD-10-CM

## 2014-08-09 DIAGNOSIS — M25551 Pain in right hip: Secondary | ICD-10-CM | POA: Diagnosis present

## 2014-08-09 HISTORY — PX: TOTAL HIP REVISION: SHX763

## 2014-08-09 SURGERY — TOTAL HIP REVISION
Anesthesia: Monitor Anesthesia Care | Site: Hip | Laterality: Right

## 2014-08-09 MED ORDER — DIPHENHYDRAMINE HCL 12.5 MG/5ML PO ELIX: 12.5000 mg | ORAL_SOLUTION | ORAL | Status: DC | PRN

## 2014-08-09 MED ORDER — CHLORHEXIDINE GLUCONATE 4 % EX LIQD: 60.0000 mL | Freq: Once | CUTANEOUS | Status: DC

## 2014-08-09 MED ORDER — LACTATED RINGERS IV SOLN
INTRAVENOUS | Status: DC | PRN
  Administered 2014-08-09 (×2): via INTRAVENOUS

## 2014-08-09 MED ORDER — PHENOL 1.4 % MT LIQD: 1.0000 | OROMUCOSAL | Status: DC | PRN

## 2014-08-09 MED ORDER — HYDROCHLOROTHIAZIDE 25 MG PO TABS
12.5000 mg | ORAL_TABLET | Freq: Every day | ORAL | Status: DC
  Administered 2014-08-10: 12.5 mg via ORAL
  Filled 2014-08-09: qty 1

## 2014-08-09 MED ORDER — DUTASTERIDE 0.5 MG PO CAPS
0.5000 mg | ORAL_CAPSULE | Freq: Every day | ORAL | Status: DC
  Administered 2014-08-10: 0.5 mg via ORAL
  Filled 2014-08-09 (×3): qty 1

## 2014-08-09 MED ORDER — SENNOSIDES-DOCUSATE SODIUM 8.6-50 MG PO TABS: 1.0000 | ORAL_TABLET | Freq: Every evening | ORAL | Status: DC | PRN

## 2014-08-09 MED ORDER — METHOCARBAMOL 1000 MG/10ML IJ SOLN
500.0000 mg | Freq: Four times a day (QID) | INTRAVENOUS | Status: DC | PRN
  Filled 2014-08-09: qty 5

## 2014-08-09 MED ORDER — BUPIVACAINE-EPINEPHRINE (PF) 0.5% -1:200000 IJ SOLN
INTRAMUSCULAR | Status: AC
  Filled 2014-08-09: qty 30

## 2014-08-09 MED ORDER — OXYCODONE HCL 5 MG PO TABS
5.0000 mg | ORAL_TABLET | ORAL | Status: DC | PRN
  Administered 2014-08-09 – 2014-08-10 (×3): 5 mg via ORAL
  Filled 2014-08-09: qty 1
  Filled 2014-08-09: qty 2
  Filled 2014-08-09: qty 1

## 2014-08-09 MED ORDER — ZOLPIDEM TARTRATE 5 MG PO TABS: 5.0000 mg | ORAL_TABLET | Freq: Every evening | ORAL | Status: DC | PRN

## 2014-08-09 MED ORDER — TRANEXAMIC ACID 1000 MG/10ML IV SOLN
1000.0000 mg | INTRAVENOUS | Status: AC
  Administered 2014-08-09: 1000 mg via INTRAVENOUS
  Filled 2014-08-09: qty 10

## 2014-08-09 MED ORDER — METOCLOPRAMIDE HCL 5 MG/ML IJ SOLN
5.0000 mg | Freq: Three times a day (TID) | INTRAMUSCULAR | Status: DC | PRN
  Administered 2014-08-09: 5 mg via INTRAVENOUS
  Filled 2014-08-09: qty 2

## 2014-08-09 MED ORDER — PROPOFOL 10 MG/ML IV BOLUS
INTRAVENOUS | Status: DC | PRN
  Administered 2014-08-09 (×3): 10 mg via INTRAVENOUS

## 2014-08-09 MED ORDER — ACETAMINOPHEN 650 MG RE SUPP: 650.0000 mg | Freq: Four times a day (QID) | RECTAL | Status: DC | PRN

## 2014-08-09 MED ORDER — METHOCARBAMOL 500 MG PO TABS: 500.0000 mg | ORAL_TABLET | Freq: Four times a day (QID) | ORAL | Status: DC | PRN

## 2014-08-09 MED ORDER — ONDANSETRON HCL 4 MG/2ML IJ SOLN: 4.0000 mg | Freq: Once | INTRAMUSCULAR | Status: DC | PRN

## 2014-08-09 MED ORDER — SODIUM CHLORIDE 0.9 % IR SOLN
Status: DC | PRN
  Administered 2014-08-09: 1000 mL

## 2014-08-09 MED ORDER — PROPOFOL 10 MG/ML IV BOLUS
INTRAVENOUS | Status: AC
  Filled 2014-08-09: qty 20

## 2014-08-09 MED ORDER — BISACODYL 5 MG PO TBEC: 5.0000 mg | DELAYED_RELEASE_TABLET | Freq: Every day | ORAL | Status: DC | PRN

## 2014-08-09 MED ORDER — PROPOFOL INFUSION 10 MG/ML OPTIME
INTRAVENOUS | Status: DC | PRN
  Administered 2014-08-09: 25 ug/kg/min via INTRAVENOUS

## 2014-08-09 MED ORDER — OXYCODONE HCL 5 MG PO TABS
ORAL_TABLET | ORAL | Status: AC
  Filled 2014-08-09: qty 1

## 2014-08-09 MED ORDER — MENTHOL 3 MG MT LOZG: 1.0000 | LOZENGE | OROMUCOSAL | Status: DC | PRN

## 2014-08-09 MED ORDER — ONDANSETRON HCL 4 MG PO TABS: 4.0000 mg | ORAL_TABLET | Freq: Four times a day (QID) | ORAL | Status: DC | PRN

## 2014-08-09 MED ORDER — HYDROMORPHONE HCL 1 MG/ML IJ SOLN
1.0000 mg | INTRAMUSCULAR | Status: DC | PRN
  Administered 2014-08-09 (×2): 1 mg via INTRAVENOUS
  Filled 2014-08-09 (×2): qty 1

## 2014-08-09 MED ORDER — FENTANYL CITRATE (PF) 100 MCG/2ML IJ SOLN
INTRAMUSCULAR | Status: DC | PRN
  Administered 2014-08-09 (×5): 50 ug via INTRAVENOUS

## 2014-08-09 MED ORDER — ACETAMINOPHEN 325 MG PO TABS: 650.0000 mg | ORAL_TABLET | Freq: Four times a day (QID) | ORAL | Status: DC | PRN

## 2014-08-09 MED ORDER — EPHEDRINE SULFATE 50 MG/ML IJ SOLN
INTRAMUSCULAR | Status: AC
  Filled 2014-08-09: qty 1

## 2014-08-09 MED ORDER — FENTANYL CITRATE (PF) 250 MCG/5ML IJ SOLN
INTRAMUSCULAR | Status: AC
  Filled 2014-08-09: qty 5

## 2014-08-09 MED ORDER — ONDANSETRON HCL 4 MG/2ML IJ SOLN
INTRAMUSCULAR | Status: DC | PRN
  Administered 2014-08-09: 4 mg via INTRAVENOUS

## 2014-08-09 MED ORDER — ALUM & MAG HYDROXIDE-SIMETH 200-200-20 MG/5ML PO SUSP: 30.0000 mL | ORAL | Status: DC | PRN

## 2014-08-09 MED ORDER — ASPIRIN EC 325 MG PO TBEC
325.0000 mg | DELAYED_RELEASE_TABLET | Freq: Every day | ORAL | Status: DC
Start: 2014-08-10 — End: 2014-08-11
  Administered 2014-08-10 – 2014-08-11 (×2): 325 mg via ORAL
  Filled 2014-08-09 (×2): qty 1

## 2014-08-09 MED ORDER — AMLODIPINE BESYLATE 5 MG PO TABS
5.0000 mg | ORAL_TABLET | Freq: Every day | ORAL | Status: DC
  Administered 2014-08-10: 5 mg via ORAL
  Filled 2014-08-09: qty 1

## 2014-08-09 MED ORDER — OXYCODONE-ACETAMINOPHEN 5-325 MG PO TABS: 1.0000 | ORAL_TABLET | ORAL | Status: DC | PRN

## 2014-08-09 MED ORDER — ONDANSETRON HCL 4 MG/2ML IJ SOLN
INTRAMUSCULAR | Status: AC
  Filled 2014-08-09: qty 2

## 2014-08-09 MED ORDER — CEFAZOLIN SODIUM-DEXTROSE 2-3 GM-% IV SOLR
INTRAVENOUS | Status: AC
  Filled 2014-08-09: qty 50

## 2014-08-09 MED ORDER — ONDANSETRON HCL 4 MG/2ML IJ SOLN
4.0000 mg | Freq: Four times a day (QID) | INTRAMUSCULAR | Status: DC | PRN
  Administered 2014-08-09 – 2014-08-10 (×3): 4 mg via INTRAVENOUS
  Filled 2014-08-09 (×3): qty 2

## 2014-08-09 MED ORDER — AMLODIPINE BESYLATE-VALSARTAN 5-320 MG PO TABS: 1.0000 | ORAL_TABLET | Freq: Every day | ORAL | Status: DC

## 2014-08-09 MED ORDER — OXYCODONE HCL 5 MG PO TABS
5.0000 mg | ORAL_TABLET | Freq: Once | ORAL | Status: AC | PRN
Start: 2014-08-09 — End: 2014-08-09
  Administered 2014-08-09: 5 mg via ORAL

## 2014-08-09 MED ORDER — CEFUROXIME SODIUM 1.5 G IJ SOLR
INTRAMUSCULAR | Status: AC
  Filled 2014-08-09: qty 1.5

## 2014-08-09 MED ORDER — IRBESARTAN 300 MG PO TABS
300.0000 mg | ORAL_TABLET | Freq: Every day | ORAL | Status: DC
  Administered 2014-08-10: 300 mg via ORAL
  Filled 2014-08-09: qty 1

## 2014-08-09 MED ORDER — CEFAZOLIN SODIUM-DEXTROSE 2-3 GM-% IV SOLR
2.0000 g | INTRAVENOUS | Status: AC
  Administered 2014-08-09: 2 g via INTRAVENOUS

## 2014-08-09 MED ORDER — DEXTROSE-NACL 5-0.45 % IV SOLN: INTRAVENOUS | Status: DC

## 2014-08-09 MED ORDER — PHENYLEPHRINE HCL 10 MG/ML IJ SOLN
INTRAMUSCULAR | Status: DC | PRN
  Administered 2014-08-09: 40 ug via INTRAVENOUS
  Administered 2014-08-09 (×2): 80 ug via INTRAVENOUS
  Administered 2014-08-09: 40 ug via INTRAVENOUS

## 2014-08-09 MED ORDER — METOCLOPRAMIDE HCL 5 MG PO TABS: 5.0000 mg | ORAL_TABLET | Freq: Three times a day (TID) | ORAL | Status: DC | PRN

## 2014-08-09 MED ORDER — PHENYLEPHRINE 40 MCG/ML (10ML) SYRINGE FOR IV PUSH (FOR BLOOD PRESSURE SUPPORT)
PREFILLED_SYRINGE | INTRAVENOUS | Status: AC
  Filled 2014-08-09: qty 10

## 2014-08-09 MED ORDER — DOCUSATE SODIUM 100 MG PO CAPS
100.0000 mg | ORAL_CAPSULE | Freq: Two times a day (BID) | ORAL | Status: DC
  Administered 2014-08-09 – 2014-08-11 (×4): 100 mg via ORAL
  Filled 2014-08-09 (×4): qty 1

## 2014-08-09 MED ORDER — TIZANIDINE HCL 2 MG PO CAPS: 2.0000 mg | ORAL_CAPSULE | Freq: Three times a day (TID) | ORAL | Status: DC

## 2014-08-09 MED ORDER — ASPIRIN EC 325 MG PO TBEC: 325.0000 mg | DELAYED_RELEASE_TABLET | Freq: Two times a day (BID) | ORAL | Status: DC

## 2014-08-09 MED ORDER — FLEET ENEMA 7-19 GM/118ML RE ENEM: 1.0000 | ENEMA | Freq: Once | RECTAL | Status: AC | PRN

## 2014-08-09 MED ORDER — OXYCODONE HCL 5 MG/5ML PO SOLN: 5.0000 mg | Freq: Once | ORAL | Status: AC | PRN

## 2014-08-09 MED ORDER — MIDAZOLAM HCL 2 MG/2ML IJ SOLN
INTRAMUSCULAR | Status: AC
  Filled 2014-08-09: qty 4

## 2014-08-09 MED ORDER — FENTANYL CITRATE (PF) 100 MCG/2ML IJ SOLN
INTRAMUSCULAR | Status: AC
  Filled 2014-08-09: qty 2

## 2014-08-09 MED ORDER — DEXAMETHASONE SODIUM PHOSPHATE 10 MG/ML IJ SOLN
10.0000 mg | Freq: Once | INTRAMUSCULAR | Status: AC
  Administered 2014-08-10: 10 mg via INTRAVENOUS
  Filled 2014-08-09: qty 1

## 2014-08-09 MED ORDER — FENTANYL CITRATE (PF) 100 MCG/2ML IJ SOLN
25.0000 ug | INTRAMUSCULAR | Status: DC | PRN
  Administered 2014-08-09 (×2): 50 ug via INTRAVENOUS

## 2014-08-09 MED ORDER — LIDOCAINE HCL (CARDIAC) 20 MG/ML IV SOLN
INTRAVENOUS | Status: AC
  Filled 2014-08-09: qty 5

## 2014-08-09 MED ORDER — KCL IN DEXTROSE-NACL 20-5-0.45 MEQ/L-%-% IV SOLN
INTRAVENOUS | Status: DC
  Administered 2014-08-09: 13:00:00 via INTRAVENOUS
  Administered 2014-08-10: 125 mL/h via INTRAVENOUS
  Administered 2014-08-10: 14:00:00 via INTRAVENOUS
  Administered 2014-08-11: 125 mL/h via INTRAVENOUS
  Filled 2014-08-09 (×6): qty 1000

## 2014-08-09 MED ORDER — EPHEDRINE SULFATE 50 MG/ML IJ SOLN
INTRAMUSCULAR | Status: DC | PRN
  Administered 2014-08-09: 10 mg via INTRAVENOUS

## 2014-08-09 SURGICAL SUPPLY — 68 items
BLADE SAW SAG 73X25 THK (BLADE)
BLADE SAW SGTL 73X25 THK (BLADE) IMPLANT
BRUSH FEMORAL CANAL (MISCELLANEOUS) IMPLANT
COVER SURGICAL LIGHT HANDLE (MISCELLANEOUS) ×3 IMPLANT
DRAPE C-ARM 42X72 X-RAY (DRAPES) IMPLANT
DRAPE IMP U-DRAPE 54X76 (DRAPES) ×3 IMPLANT
DRAPE ORTHO SPLIT 77X108 STRL (DRAPES) ×3
DRAPE PROXIMA HALF (DRAPES) ×3 IMPLANT
DRAPE SURG ORHT 6 SPLT 77X108 (DRAPES) ×1 IMPLANT
DRAPE U-SHAPE 47X51 STRL (DRAPES) ×3 IMPLANT
DRILL BIT 7/64X5 (BIT) ×3 IMPLANT
DRSG AQUACEL AG ADV 3.5X10 (GAUZE/BANDAGES/DRESSINGS) ×3 IMPLANT
DURAPREP 26ML APPLICATOR (WOUND CARE) ×3 IMPLANT
ELECT BLADE 4.0 EZ CLEAN MEGAD (MISCELLANEOUS)
ELECT BLADE 6.5 EXT (BLADE) IMPLANT
ELECT REM PT RETURN 9FT ADLT (ELECTROSURGICAL) ×3
ELECTRODE BLDE 4.0 EZ CLN MEGD (MISCELLANEOUS) IMPLANT
ELECTRODE REM PT RTRN 9FT ADLT (ELECTROSURGICAL) ×1 IMPLANT
ELIMINATOR HOLE APEX DEPUY (Hips) ×2 IMPLANT
EVACUATOR 1/8 PVC DRAIN (DRAIN) IMPLANT
GAUZE SPONGE 4X4 12PLY STRL (GAUZE/BANDAGES/DRESSINGS) ×3 IMPLANT
GAUZE XEROFORM 5X9 LF (GAUZE/BANDAGES/DRESSINGS) ×3 IMPLANT
GLOVE BIO SURGEON STRL SZ7.5 (GLOVE) ×3 IMPLANT
GLOVE BIO SURGEON STRL SZ8.5 (GLOVE) ×6 IMPLANT
GLOVE BIOGEL PI IND STRL 8 (GLOVE) ×2 IMPLANT
GLOVE BIOGEL PI IND STRL 9 (GLOVE) ×1 IMPLANT
GLOVE BIOGEL PI INDICATOR 8 (GLOVE) ×4
GLOVE BIOGEL PI INDICATOR 9 (GLOVE) ×2
GOWN STRL REUS W/ TWL LRG LVL3 (GOWN DISPOSABLE) ×1 IMPLANT
GOWN STRL REUS W/ TWL XL LVL3 (GOWN DISPOSABLE) ×2 IMPLANT
GOWN STRL REUS W/TWL LRG LVL3 (GOWN DISPOSABLE) ×3
GOWN STRL REUS W/TWL XL LVL3 (GOWN DISPOSABLE) ×6
HANDPIECE INTERPULSE COAX TIP (DISPOSABLE)
HEAD FEM DLT TS CER 36X+0 (Hips) ×2 IMPLANT
HOOD PEEL AWAY FACE SHEILD DIS (HOOD) ×6 IMPLANT
KIT BASIN OR (CUSTOM PROCEDURE TRAY) ×3 IMPLANT
KIT ROOM TURNOVER OR (KITS) ×3 IMPLANT
LINER MARATHON 10D 36M 58+10 (Hips) IMPLANT
LINER MARATHON 10DEG 36M 58+10 (Hips) ×3 IMPLANT
LINER PINN ACET GRIP 60X100 ×2 IMPLANT
MANIFOLD NEPTUNE II (INSTRUMENTS) ×3 IMPLANT
NEEDLE 22X1 1/2 (OR ONLY) (NEEDLE) ×3 IMPLANT
NS IRRIG 1000ML POUR BTL (IV SOLUTION) ×6 IMPLANT
PACK TOTAL JOINT (CUSTOM PROCEDURE TRAY) ×3 IMPLANT
PACK UNIVERSAL I (CUSTOM PROCEDURE TRAY) ×3 IMPLANT
PAD ARMBOARD 7.5X6 YLW CONV (MISCELLANEOUS) ×6 IMPLANT
PASSER SUT SWANSON 36MM LOOP (INSTRUMENTS) IMPLANT
PRESSURIZER FEMORAL UNIV (MISCELLANEOUS) IMPLANT
SET HNDPC FAN SPRY TIP SCT (DISPOSABLE) IMPLANT
SLEEVE SURGEON STRL (DRAPES) IMPLANT
SPONGE LAP 18X18 X RAY DECT (DISPOSABLE) IMPLANT
STAPLER VISISTAT 35W (STAPLE) ×3 IMPLANT
STEM FEM MOD 36+8 17X22X165 (Hips) ×2 IMPLANT
SUT ETHIBOND 2 V 37 (SUTURE) ×3 IMPLANT
SUT VIC AB 0 CTB1 27 (SUTURE) ×3 IMPLANT
SUT VIC AB 1 CTX 36 (SUTURE) ×3
SUT VIC AB 1 CTX36XBRD ANBCTR (SUTURE) ×1 IMPLANT
SUT VIC AB 2-0 CTB1 (SUTURE) ×3 IMPLANT
SUT VIC AB 3-0 CT1 27 (SUTURE) ×3
SUT VIC AB 3-0 CT1 TAPERPNT 27 (SUTURE) ×1 IMPLANT
SYR 20ML ECCENTRIC (SYRINGE) ×3 IMPLANT
SYR CONTROL 10ML LL (SYRINGE) ×3 IMPLANT
TOWEL OR 17X24 6PK STRL BLUE (TOWEL DISPOSABLE) ×3 IMPLANT
TOWEL OR 17X26 10 PK STRL BLUE (TOWEL DISPOSABLE) ×3 IMPLANT
TOWER CARTRIDGE SMART MIX (DISPOSABLE) IMPLANT
TRAY FOLEY CATH 14FR (SET/KITS/TRAYS/PACK) IMPLANT
TUBE ANAEROBIC SPECIMEN COL (MISCELLANEOUS) ×3 IMPLANT
WATER STERILE IRR 1000ML POUR (IV SOLUTION) ×9 IMPLANT

## 2014-08-09 NOTE — Anesthesia Preprocedure Evaluation (Signed)
Anesthesia Evaluation  Patient identified by MRN, date of birth, ID band Patient awake    Reviewed: Allergy & Precautions, NPO status , Patient's Chart, lab work & pertinent test results  Airway Mallampati: II  TM Distance: >3 FB Neck ROM: Full    Dental  (+) Edentulous Upper, Edentulous Lower   Pulmonary Current Smoker,  breath sounds clear to auscultation        Cardiovascular hypertension, Rhythm:Regular Rate:Normal     Neuro/Psych    GI/Hepatic   Endo/Other    Renal/GU      Musculoskeletal   Abdominal   Peds  Hematology   Anesthesia Other Findings   Reproductive/Obstetrics                             Anesthesia Physical Anesthesia Plan  ASA: III  Anesthesia Plan: Spinal and MAC   Post-op Pain Management:    Induction: Intravenous  Airway Management Planned: Natural Airway and Simple Face Mask  Additional Equipment:   Intra-op Plan:   Post-operative Plan:   Informed Consent: I have reviewed the patients History and Physical, chart, labs and discussed the procedure including the risks, benefits and alternatives for the proposed anesthesia with the patient or authorized representative who has indicated his/her understanding and acceptance.     Plan Discussed with: CRNA and Anesthesiologist  Anesthesia Plan Comments:         Anesthesia Quick Evaluation

## 2014-08-09 NOTE — Care Management Note (Deleted)
Case Management Note  Patient Details  Name: Dustin Lucas MRN: [id number] Date of Birth: 13-Apr-1940  Subjective/Objective:  74 yr old male s/p right total hip arthroplasty revision              Action/Plan:  Case manager spoke with patient's wife concerning home health and DME needs. Patient was preoperatively setup with [facility], no changes. Patient's wife states she would like to have same therapist they had with initial surgery, but she doesn't know his name. They have walkers, and 3in1. Patient has family support at discharge.    Expected Discharge Date:    08/11/14              Expected Discharge Plan:   Home with Home health  In-House Referral:     Discharge planning Services     Post Acute Care Choice:    Choice offered to:     DME Arranged:    DME Agency:     HH Arranged:    [place] Agency:     Status of Service:     Medicare Important Message Given:    Date Medicare IM Given:    Medicare IM give by:    Date Additional Medicare IM Given:    Additional Medicare Important Message give by:     If discussed at [place] of Stay Meetings, dates discussed:    Additional Comments:  Ninfa Meeker, RN 08/09/2014, 4:02 PM

## 2014-08-09 NOTE — Care Management Note (Signed)
Case Management Note  Patient Details  Name: Yates Weisgerber MRN: [id number] Date of Birth: 08-18-1940  Subjective/Objective:                  74 yr old male s/p right total hip arthroplasty revision    Action/Plan: Case manager spoke with patient's wife concerning home health and DME needs. Patient was preoperatively setup with [facility], no changes. Patient's wife states she would like to have same therapist they had with initial surgery, but she doesn't know his name. They have walkers, and 3in1. Patient has family support at discharge.    Expected Discharge Date:   08/11/14   Expected Discharge Plan:  Home with Home health  Action/Plan:   Expected Discharge Date:   08/11/14               Expected Discharge Plan:  [place]  In-House Referral:  NA  Discharge planning Services  CM Consult  Post Acute Care Choice:  Home Health Choice offered to:  Spouse  DME Arranged:  N/A DME Agency:  NA  HH Arranged:  PT HH Agency:  [place]  Status of Service:  Completed, signed off  Medicare Important Message Given:    Date Medicare IM Given:    Medicare IM give by:    Date Additional Medicare IM Given:    Additional Medicare Important Message give by:     If discussed at [place] of Stay Meetings, dates discussed:    Additional Comments:  Ninfa Meeker, RN 08/09/2014, 4:17 PM

## 2014-08-09 NOTE — Interval H&P Note (Signed)
History and Physical Interval Note:  08/09/2014 7:13 AM  Dustin Lucas  has presented today for surgery, with the diagnosis of Right hip ASR Recall  The various methods of treatment have been discussed with the patient and family. After consideration of risks, benefits and other options for treatment, the patient has consented to  Procedure(s) with comments: TOTAL HIP REVISION (Right) - REVISION (ASR) RIGHT TOTAL HIP ARHROPLASTY  **INNOMET OSTEOTONES as a surgical intervention .  The patient's history has been reviewed, patient examined, no change in status, stable for surgery.  I have reviewed the patient's chart and labs.  Questions were answered to the patient's satisfaction.     Kerin Salen

## 2014-08-09 NOTE — Op Note (Signed)
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 Revision of 22 x 17 x 36 x 160+8 S-ROM stem to a new stem of the same dimensions.  Surgeon: Kathalene Frames. Mayer Camel M.D.  Assistant: Kerry Hough. Barton Dubois  (present throughout entire procedure and necessary for timely completion of the procedure)  Estimated blood loss: 400 cc  Fluid replacement: 1600 cc of crystalloid  Anesthesia: Spinal  Tranexamic acid 1 g IV  Specimens: Synovial fluid and synovial tissue for pathology, Gram stain and culture.  Complications: None  Indications: Patient with an ASR on S-ROM total hip that did very well until a few months ago when he had increasing groin pain. The pain wakes him up at night and recently got to the point where he could no longer go walking on a regular basis. MRI scan showed minimal fluid collection, but no bony or muscle destruction. Plain x-rays show no change in the position of the components and the stem appears to be well placed and well fixed. Metal ions have been checked twice and were both times were below 2 ppb. Risks and benefits of revision surgery have been discussed and questions answered.  Procedure: Patient was identified by arm band receive preoperative IV antibiotics in the holding area at, and hospital. He was then taken to the operating room where the appropriate anesthetic monitors were attached and spinal anesthesia induced with the patient in the supine position. He was then rolled into the L lateral decubitus position and fixed there with a mark 2 pelvic clamp. The limb was prepped and draped in usual sterile fashion from the ankle to the hemipelvis. Time out procedure performed. Skin along the lateral hip and thigh infiltrated with 10 cc of 1/2%  Marcaine and epinephrine solution. We began the operation by recreating the old posterior lateral incision 15 cm in line through the skin and subcutaneous tissue down to the level of the IT band which was cut in line with the skin incision. There is no fluid in the interval between the IT band and the greater trochanter noted. We developed a posterior capsular flap of scar tissue and tagged with 3 #2 Ethibond sutures. At this point we entered the joint encountered relatively clear appearing joint fluid that was sent off for Gram stain and culture we also sent off some of the tissue from the synovium for pathology. We then remove scar tissue from around to the ASR cup and femoral stem trunnion, dislocated a total hip and removed the ASR head with a mallet and metal cylinder. Using the large screwdriver wedge we then broke the seal between the stem and the sleeve and remove the stem without too much difficulty. A Hohmann retractor was placed superior and anterior to the acetabulum and a spiked Cobra inferior to the cotyloid notch. A posterior inferior wing retractor was hammered into place. And we began loosening the cup by placing a 1/4 inch osteotome between the edge of the acetabular component and the bone. We then used the short Innomed curved osteotome around the edge of the cup and at that point it came out with moderate effort and had moderate ingrowth. Moderate bone was noticed on the ingrowth surface of the cup and fibrous tissue is then stripped from the  acetabulum. We reamed up to a 59 mm basket reamer obtaining good coverage in all quadrants irrigated with normal saline solution. We then hammered into place a 60 mm Pinnacle cup in 45 of abduction and 20 of anteversion. A central occluder was screwed into place followed by a 10 polyethylene liner index posterior and superior. We then reamed the femur with the 17.5 mm reamer in preparation for placement of a new 22 x 17 x 36+8 x 1 60 stem. A trial  reduction was then performed with a +0 36 mm femoral head instability was noted to 90 of flexion 70 of internal rotation and in full extension the hip could not be dislocated with external rotation. At this point the trial stem was removed and a new stem was hammered into place in 20 of anteversion in relation to the knee. A real +0 36 mm ceramic head was hammered into place, the hip reduced and irrigated with normal saline solution. The capsular flap was repaired back to the intertrochanteric crest through drill holes with #2 Ethibond suture. We then closed the IT band with running #1 Vicryl suture, the subcutaneous tissue with 0 and 2-0 undyed Vicryl suture, the skin was closed with running interlocking 3-0 nylon suture. A dressing of Mepilex was then applied, the patient was unclamped a rolled supine awakened extubated and taken to the recovery without difficulty.

## 2014-08-09 NOTE — Anesthesia Postprocedure Evaluation (Signed)
  Anesthesia Post-op Note  Patient: Dustin Lucas  Procedure(s) Performed: Procedure(s) with comments: TOTAL HIP REVISION (Right) - REVISION (ASR) RIGHT TOTAL HIP ARHROPLASTY  **INNOMET OSTEOTONES  Patient Location: PACU  Anesthesia Type:MAC and GA combined with regional for post-op pain  Level of Consciousness: awake, alert  and oriented  Airway and Oxygen Therapy: Patient Spontanous Breathing and Patient connected to nasal cannula oxygen  Post-op Pain: mild  Post-op Assessment: Post-op Vital signs reviewed, Patient's Cardiovascular Status Stable, Respiratory Function Stable, No signs of Nausea or vomiting and Pain level controlled LLE Motor Response: Purposeful movement LLE Sensation: Full sensation RLE Motor Response: Purposeful movement RLE Sensation: Full sensation L Sensory Level: S1-Sole of foot, small toes R Sensory Level: S1-Sole of foot, small toes  Post-op Vital Signs: stable  Last Vitals:  Filed Vitals:   08/09/14 1220  BP: 135/69  Pulse: 95  Temp: 36.9 C  Resp: 24    Complications: No apparent anesthesia complications

## 2014-08-09 NOTE — Discharge Instructions (Signed)

## 2014-08-09 NOTE — Evaluation (Signed)
Physical Therapy Evaluation Patient Details Name: Dustin Lucas MRN: [id number] DOB: 1940/02/25 Today's Date: 08/09/2014   History of Present Illness  Adm for Rt total hip revision (posterior) PMHx- prostate Ca, HTN, PONV  Clinical Impression  Pt is s/p rt hip revision resulting in the deficits listed below (see PT Problem List). Evaluation limited by nausea and vertigo (sense of room spinning) due to IV pain meds given just prior to PT session. (Pt reports very sensitive to meds).  Pt will benefit from skilled PT to increase their independence and safety with mobility while adhering to his hip precautions to allow discharge to the venue listed below.      Follow Up Recommendations Home health PT;Supervision for mobility/OOB    Equipment Recommendations  None recommended by PT    Recommendations for Other Services OT consult     Precautions / Restrictions Precautions Precautions: Posterior Hip;Fall Precaution Booklet Issued: Yes (comment) Precaution Comments: pt educated and able to recall 2/3 at end Restrictions Weight Bearing Restrictions: Yes RLE Weight Bearing: Weight bearing as tolerated      Mobility  Bed Mobility               General bed mobility comments: unable due to nausea and vertigo (due to recent pain meds per pt)  Transfers                    Ambulation/Gait                Stairs            Wheelchair Mobility    Modified Rankin (Stroke Patients Only)       Balance                                             Pertinent Vitals/Pain Pain Assessment: 0-10 Pain Score: 7  Pain Location: Rt hip Pain Intervention(s): Limited activity within patient's tolerance;Premedicated before session;Ice applied    Home Living Family/patient expects to be discharged to:: Private residence Living Arrangements: Spouse/significant other Available Help at Discharge: Family;Available 24 hours/day Type of Home:  House Home Access: Stairs to enter Entrance Stairs-Rails: Right;Left;Can reach both Entrance Stairs-Number of Steps: 3 Home Layout: One level Home Equipment: Walker - standard;Walker - 4 wheels;Cane - single point;Bedside commode;Walker - 2 wheels      Prior Function Level of Independence: Independent               Hand Dominance   Dominant Hand: Right    Extremity/Trunk Assessment   Upper Extremity Assessment:  (triceps, shoulders WFL)           Lower Extremity Assessment: RLE deficits/detail;LLE deficits/detail RLE Deficits / Details: AAROM limited by pain (and anxious re: anticipated pain) LLE Deficits / Details: AROM, strength WFL     Communication   Communication: No difficulties  Cognition Arousal/Alertness: Awake/alert Behavior During Therapy: Anxious Overall Cognitive Status: Within Functional Limits for tasks assessed                      General Comments      Exercises Total Joint Exercises Ankle Circles/Pumps: AROM;Both;5 reps;Limitations Ankle Circles/Pumps Limitations: pt is 6'3" with limited room at foot of bed Quad Sets: AROM;Both;10 reps Short Arc QuadSinclair Lucas;Right;10 reps Heel Slides: AAROM;Right;5 reps;AROM;Left;10 reps Hip ABduction/ADduction: AAROM;Right;5 reps      Assessment/Plan  PT Assessment Patient needs continued PT services  PT Diagnosis Acute pain;Difficulty walking   PT Problem List Decreased range of motion;Decreased activity tolerance;Decreased balance;Decreased mobility;Decreased knowledge of use of DME;Decreased knowledge of precautions;Impaired sensation;Pain  PT Treatment Interventions DME instruction;Gait training;Stair training;Functional mobility training;Therapeutic activities;Therapeutic exercise;Patient/family education   PT Goals (Current goals can be found in the Care Plan section) Acute Rehab PT Goals Patient Stated Goal: decr pain and incr activity PT Goal Formulation: With patient Time For Goal  Achievement: 08/13/14 Potential to Achieve Goals: Good    Frequency 7X/week   Barriers to discharge        Co-evaluation               End of Session   Activity Tolerance: Treatment limited secondary to medical complications (Comment) (nausea, vertigo) Patient left: in bed;with call bell/phone within reach;with family/visitor present Nurse Communication: Other (comment) (nausea, vertigo; did not make it to dangle)         Time: 1975-8832 PT Time Calculation (min) (ACUTE ONLY): 26 min   Charges:   PT Evaluation $Initial PT Evaluation Tier I: 1 Procedure PT Treatments $Therapeutic Exercise: 8-22 mins   PT G Codes:        Dustin Lucas 09-08-2014, 4:00 PM  Pager [phone]

## 2014-08-09 NOTE — Progress Notes (Signed)
Utilization review completed.  

## 2014-08-09 NOTE — Anesthesia Procedure Notes (Signed)
Spinal Patient location during procedure: OR Start time: 08/09/2014 7:36 AM Staffing Performed by: anesthesiologist  Preanesthetic Checklist Completed: patient identified, site marked, surgical consent, pre-op evaluation, timeout performed, IV checked, risks and benefits discussed and monitors and equipment checked Spinal Block Patient position: right lateral decubitus Prep: ChloraPrep Patient monitoring: heart rate, cardiac monitor, continuous pulse ox and blood pressure Approach: right paramedian Location: L3-4 Injection technique: single-shot Needle Needle type: Tuohy  Needle gauge: 22 G Needle length: 9 cm Needle insertion depth: 6 cm Assessment Sensory level: T8 Additional Notes 10 mg 0.75% Bupivacaine injected easily

## 2014-08-09 NOTE — Transfer of Care (Signed)
Immediate Anesthesia Transfer of Care Note  Patient: Dustin Lucas  Procedure(s) Performed: Procedure(s) with comments: TOTAL HIP REVISION (Right) - REVISION (ASR) RIGHT TOTAL HIP ARHROPLASTY  **INNOMET OSTEOTONES  Patient Location: PACU  Anesthesia Type:MAC and Regional  Level of Consciousness: awake, alert , oriented and sedated  Airway & Oxygen Therapy: Patient Spontanous Breathing and Patient connected to nasal cannula oxygen  Post-op Assessment: Report given to RN, Post -op Vital signs reviewed and stable and Patient moving all extremities  Post vital signs: Reviewed and stable  Last Vitals:  Filed Vitals:   08/09/14 0633  BP: 146/79  Pulse: 75  Temp: 36.3 C  Resp: 20    Complications: No apparent anesthesia complications

## 2014-08-10 ENCOUNTER — Encounter (HOSPITAL_COMMUNITY): Payer: Self-pay | Admitting: Orthopedic Surgery

## 2014-08-10 LAB — CBC
HCT: 37.2 % — ABNORMAL LOW (ref 39.0–52.0)
HEMOGLOBIN: 12.3 g/dL — AB (ref 13.0–17.0)
MCH: 31.6 pg (ref 26.0–34.0)
MCHC: 33.1 g/dL (ref 30.0–36.0)
MCV: 95.6 fL (ref 78.0–100.0)
PLATELETS: 122 10*3/uL — AB (ref 150–400)
RBC: 3.89 MIL/uL — ABNORMAL LOW (ref 4.22–5.81)
RDW: 14.5 % (ref 11.5–15.5)
WBC: 7.2 10*3/uL (ref 4.0–10.5)

## 2014-08-10 NOTE — Progress Notes (Signed)
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  Pt was able to take a few steps back toward bed after standing w/ mod +2 assist. Pt is anticipating going to [facility] upon d/c.  Pt will benefit from continued skilled PT services to increase functional independence and safety.     Follow Up Recommendations  SNF;Supervision/Assistance - 24 hour     Equipment Recommendations  None recommended by PT    Recommendations for Other Services       Precautions / Restrictions Precautions Precautions: Posterior Hip;Fall Precaution Comments: Pt able to recall 3/3 hip precautions w/ increased time Restrictions Weight Bearing Restrictions: Yes RLE Weight Bearing: Weight bearing as tolerated    Mobility  Bed Mobility Overal bed mobility: Needs Assistance;+2 for physical assistance Bed Mobility: Sit to Supine       Sit to supine: Mod assist   General bed mobility comments: Mod +2 assist w/ managing into bed and use of bed pad for proper positioning once supine.  Pt pushed through LLE and BUEs to scoot up toward [employer] w/ VCs for technique.  Transfers Overall transfer level: Needs assistance Equipment used: Rolling walker (2 wheeled) Transfers: Sit to/from Stand Sit to Stand: +2 physical assistance;+2 safety/equipment;Mod assist         General transfer comment: Mod +2 assist to power up to standing and to maintain balance.  Cues again provided to stand upright and to push through BUEs and BLEs to maintain upright position.  Once pt standing, recliner chair moved and bed pushed behind pt.    Ambulation/Gait Ambulation/Gait assistance: Mod assist;+2 safety/equipment;+2 physical assistance Ambulation Distance (Feet): 3  Feet Assistive device: Rolling walker (2 wheeled) Gait Pattern/deviations: Step-to pattern;Antalgic;Trunk flexed;Staggering left;Staggering right;Decreased weight shift to right;Decreased stride length;Decreased stance time - right   Gait velocity interpretation: Below normal speed for age/gender General Gait Details: adjusted height of RW down which allowed pt to push through [place] more easily.  Cues and assistance w/ managing RW and maintaining upright as pt continues to flex trunk.   Stairs            Wheelchair Mobility    Modified Rankin (Stroke Patients Only)       Balance Overall balance assessment: Needs assistance Sitting-balance support: Bilateral upper extremity supported;Feet supported Sitting balance-Leahy Scale: Fair     Standing balance support: Bilateral upper extremity supported;During functional activity Standing balance-Leahy Scale: Poor                      Cognition Arousal/Alertness: Awake/alert Behavior During Therapy: Anxious;Impulsive Overall Cognitive Status: Within Functional Limits for tasks assessed                      Exercises Total Joint Exercises Ankle Circles/Pumps: AROM;Both;15 reps;Supine Quad Sets: AROM;Both;10 reps;Supine Gluteal Sets: Strengthening;Both;10 reps;Supine Hip ABduction/ADduction: AAROM;Right;10 reps;Supine    General Comments General comments (skin integrity, edema, etc.): Dec sensation B dermatomes L3-S1      Pertinent Vitals/Pain Pain Assessment: 0-10 Pain Score: 5  Pain Location: Rt hip Pain Descriptors / Indicators: Guarding;Heaviness;Jabbing Pain Intervention(s): Limited activity within patient's tolerance;Monitored during session;Repositioned    Home Living Family/patient expects to be discharged to:: Private residence Living Arrangements:  Spouse/significant other Available Help at Discharge: Family;Available 24 hours/day Type of Home: House Home Access: Stairs to enter Entrance  Stairs-Rails: Right;Left;Can reach both Home Layout: One level Home Equipment: Nurse, children's - 4 wheels;Cane - single point;Bedside commode;Walker - 2 wheels      Prior Function Level of Independence: Independent          PT Goals (current goals can now be found in the care plan section) Acute Rehab PT Goals Patient Stated Goal: to get stronger PT Goal Formulation: With patient Time For Goal Achievement: 08/13/14 Potential to Achieve Goals: Good Progress towards PT goals: Progressing toward goals (very modestly)    Frequency  7X/week    PT Plan Current plan remains appropriate    Co-evaluation             End of Session Equipment Utilized During Treatment: Gait belt Activity Tolerance: Patient limited by fatigue;Patient limited by pain Patient left: with call bell/phone within reach;with family/visitor present;in bed;with nursing/sitter in room (w/ OT in room)     Time: 1335-1406 PT Time Calculation (min) (ACUTE ONLY): 31 min  Charges:  $Therapeutic Exercise: 8-22 mins $Therapeutic Activity: 8-22 mins                    G Codes:      Dustin Lucas PT, DPT [phone] Pager: [phone] 08/10/2014, 2:32 PM

## 2014-08-10 NOTE — Evaluation (Signed)
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 Patient independent->mod I PTA. Patient currently requires up to total assist +2 for LB ADLs and mod>max +2 assist for functional mobility/transfers. Patient will benefit from acute OT to increase overall independence in the areas of ADLs, functional mobility, and overall safety in order to safely discharge to venue listed below.     Follow Up Recommendations  SNF;Supervision/Assistance - 24 hour    Equipment Recommendations  Other (comment) (AE, rest TBD)    Recommendations for Other Services  None at this time   Precautions / Restrictions Precautions Precautions: Posterior Hip;Fall Precaution Comments: Pt able to recall 3/3 hip precautions w/ increased time Restrictions Weight Bearing Restrictions: Yes RLE Weight Bearing: Weight bearing as tolerated    Mobility - Per PT note Bed Mobility Overal bed mobility: Needs Assistance;+2 for physical assistance Bed Mobility: Supine to Sit     Supine to sit: Mod assist;+2 for physical assistance     General bed mobility comments: Mod +2 assist w/ managing RLE to EOB and supporting trunk posteriorly.  Pt w/ increased time and required max VCs for technique.  Transfers Overall transfer level: Needs assistance Equipment used: Rolling walker (2 wheeled) Transfers: Sit to/from Stand Sit to Stand: Max assist;+2 physical assistance;From elevated surface;+2 safety/equipment General transfer comment: Max +2 assist to power up to standing and to maintain standing to prevent fall.  Cues for proper hand positioning to push from bed.  Pt unable to push up to standing w/ bed low and it  was elevated.  Upon standing pt pushing heavily thorugh BUEs to maintain standing, fatiguing after 10 seconds and reaching back to recliner chair.  Pt maintains flexed trunk despite verbal and tactile cues and physical assist to stand upright.  Pt w/ urinary incontinence upon standing.  Max instability in standing.  Once pt standing bed was moved out of the way and recliner chair placed behind pt.    Balance - Per PT note Overall balance assessment: Needs assistance Sitting-balance support: Bilateral upper extremity supported;Feet supported Sitting balance-Leahy Scale: Fair Sitting balance - Comments: Close min guard when sitting EOB 2/2 instability   Standing balance support: Bilateral upper extremity supported;During functional activity Standing balance-Leahy Scale: Zero    ADL Overall ADL's : Needs assistance/impaired Eating/Feeding: Set up;Sitting   Grooming: Set up;Sitting   Upper Body Bathing: Set up;Sitting   Lower Body Bathing: Total assistance;Sit to/from stand;Cueing for safety;+2 for physical assistance (+2 needed for sit<>stand)   Upper Body Dressing : Set up;Sitting   Lower Body Dressing: +2 for physical assistance;Sit to/from stand;Total assistance (+2 needed for sit<>stand)   Toilet Transfer: +2 for physical assistance             General ADL Comments: Pt found seated in recliner post PT session. Wife present in room. Reiterated posterior hip precautions, educated wife and pt on use of AE for LB ADLs due to these hip precautions. Wife made comment "he has everything he needs at home; walker, cane, etc". Therapist explained the difference and need for AE for LB ADLs. Pt then started wiping self off after incontinent episode from earlier. Therapist assisted secondary to hip precautions. Pt with difficulty sitting forward without back support. No sit<>stand occured,  see PT note for more information regarding these.     Pertinent Vitals/Pain Pain Assessment: No/denies  pain (at rest) Faces Pain Scale: Hurts even more Pain Location: Rt hip Pain Descriptors / Indicators: Sore Pain Intervention(s): Limited activity within patient's tolerance;Monitored during session;Repositioned     Hand Dominance Right   Extremity/Trunk Assessment Upper Extremity Assessment Upper Extremity Assessment: Overall WFL for tasks assessed   Lower Extremity Assessment Lower Extremity Assessment: Defer to PT evaluation   Cervical / Trunk Assessment Cervical / Trunk Assessment: Normal   Communication Communication Communication: No difficulties   Cognition Arousal/Alertness: Awake/alert Behavior During Therapy: WFL for tasks assessed/performed Overall Cognitive Status: Within Functional Limits for tasks assessed              Home Living Family/patient expects to be discharged to:: Private residence Living Arrangements: Spouse/significant other Available Help at Discharge: Family;Available 24 hours/day Type of Home: House Home Access: Stairs to enter [employer] of Steps: 3 Entrance Stairs-Rails: Right;Left;Can reach both Home Layout: One level     Bathroom Shower/Tub: Walk-in shower;Door   Bathroom Toilet: Handicapped height Bathroom Accessibility: Yes   Home Equipment: Nurse, children's - 4 wheels;Cane - single point;Bedside commode;Walker - 2 wheels   Prior Functioning/Environment Level of Independence: Independent     OT Diagnosis: Generalized weakness;Acute pain   OT Problem List: Decreased strength;Decreased range of motion;Decreased activity tolerance;Impaired balance (sitting and/or standing);Decreased safety awareness;Decreased knowledge of use of DME or AE;Decreased knowledge of precautions;Pain   OT Treatment/Interventions: Self-care/ADL training;Therapeutic exercise;Energy conservation;DME and/or AE instruction;Therapeutic activities;Patient/family education;Balance training    OT Goals(Current goals can be found in the care  plan section) Acute Rehab OT Goals Patient Stated Goal: none stated OT Goal Formulation: With patient/family Time For Goal Achievement: 08/24/14 Potential to Achieve Goals: Good ADL Goals Pt Will Perform Grooming: with supervision;standing Pt Will Perform Lower Body Bathing: sit to/from stand;with adaptive equipment;with min assist Pt Will Perform Lower Body Dressing: with min assist;with adaptive equipment;sit to/from stand Pt Will Transfer to Toilet: with min assist;bedside commode;ambulating Pt Will Perform Toileting - Clothing Manipulation and hygiene: with min assist;sit to/from stand Pt Will Perform Tub/Shower Transfer: Shower transfer;3 in 1;rolling walker;ambulating;with min assist Additional ADL Goal #1: Pt will independently verbalize and adhere to 3/3 posterior hip precautions 100% of the time  OT Frequency: Min 2X/week   Barriers to D/C: None known at this time   End of Session Nurse Communication: Mobility status  Activity Tolerance: Patient tolerated treatment well Patient left: in chair;with call bell/phone within reach;with family/visitor present   Time: 9604-5409 OT Time Calculation (min): 31 min Charges:  OT General Charges $OT Visit: 1 Procedure OT Evaluation $Initial OT Evaluation Tier I: 1 Procedure OT Treatments $Self Care/Home Management : 8-22 mins  Jaleisa Brose , MS, OTR/L, CLT Pager: [phone]  08/10/2014, 10:42 AM

## 2014-08-10 NOTE — Progress Notes (Signed)
Patient ID: Dustin Lucas, male   DOB: [birth year], 74 y.o.   MRN: [id number] PATIENT ID: Dustin Lucas  MRN: [id number]  DOB/AGE:  1940-02-12 / 74 y.o.  1 Day Post-Op Procedure(s) (LRB): TOTAL HIP REVISION (Right)    PROGRESS NOTE Subjective: Patient is alert, oriented, x1 Nausea, x1 Vomiting, yes passing gas, no Bowel Movement. Taking PO well. Denies SOB, Chest or Calf Pain. Using Incentive Spirometer, PAS in place. Ambulate WBAT Patient reports pain as 3 on 0-10 scale  .    Objective: Vital signs in last 24 hours: Filed Vitals:   08/09/14 1145 08/09/14 1220 08/09/14 1800 08/09/14 2015  BP: 135/74 135/69  99/60  Pulse: 88 95  89  Temp:  98.4 F (36.9 C)  98.2 F (36.8 C)  TempSrc:  Oral  Oral  Resp: 26 24  18   Height:      Weight:      SpO2: 100% 100% 99% 100%      Intake/Output from previous day: I/O last 3 completed shifts: In: 1640 [P.O.:240; I.V.:1400] Out: 1250 [Urine:850; Emesis/NG output:400]   Intake/Output this shift:     LABORATORY DATA: No results for input(s): WBC, HGB, HCT, PLT, NA, K, CL, CO2, BUN, CREATININE, GLUCOSE, GLUCAP, INR, CALCIUM in the last 72 hours.  Invalid input(s): PT, 2  Examination: Neurologically intact ABD soft Neurovascular intact Sensation intact distally Intact pulses distally Dorsiflexion/Plantar flexion intact Incision: scant drainage No cellulitis present Compartment soft} XR AP&Lat of hip shows well placed\fixed RevTHA  Assessment:   1 Day Post-Op Procedure(s) (LRB): TOTAL HIP REVISION (Right) ADDITIONAL DIAGNOSIS:  Expected Acute Blood Loss Anemia, HTN  Plan: PT/OT WBAT, THA  posterior precautions  DVT Prophylaxis: SCDx72 hrs, ASA 325 mg BID x 2 weeks  DISCHARGE PLAN: Home  DISCHARGE NEEDS: HHPT, Walker and 3-in-1 comode seat

## 2014-08-10 NOTE — Progress Notes (Signed)
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  Ambulation not assessed this session 2/2 pt's severe instability and decreased safety w/ sit<>stand.  Therefore, updating d/c recommendation to SNF.  RN notified and message left for Dr. Mayer Camel.  Pt will benefit from continued skilled PT services to increase functional independence and safety.   Follow Up Recommendations  SNF;Supervision/Assistance - 24 hour     Equipment Recommendations  None recommended by PT    Recommendations for Other Services       Precautions / Restrictions Precautions Precautions: Posterior Hip;Fall Precaution Comments: Pt able to recall 3/3 hip precautions w/ increased time Restrictions Weight Bearing Restrictions: Yes RLE Weight Bearing: Weight bearing as tolerated    Mobility  Bed Mobility Overal bed mobility: Needs Assistance;+2 for physical assistance Bed Mobility: Supine to Sit     Supine to sit: Mod assist;+2 for physical assistance     General bed mobility comments: Mod +2 assist w/ managing RLE to EOB and supporting trunk posteriorly.  Pt w/ increased time and required max VCs for technique.  Transfers Overall transfer level: Needs assistance Equipment used: Rolling walker (2 wheeled) Transfers: Sit to/from Stand Sit to Stand: Max assist;+2 physical assistance;From elevated surface;+2 safety/equipment         General transfer comment: Max +2 assist to power up to standing and to maintain standing to prevent fall.  Cues for proper hand positioning to push from bed.  Pt unable to push up to standing w/ bed low and it was elevated.  Upon standing pt pushing heavily thorugh BUEs to maintain standing, fatiguing after 10 seconds and  reaching back to recliner chair.  Pt maintains flexed trunk despite verbal and tactile cues and physical assist to stand upright.  Pt w/ urinary incontinence upon standing.  Max instability in standing.  Once pt standing bed was moved out of the way and recliner chair placed behind pt.  Ambulation/Gait             General Gait Details: did not attempt this session 2/2 pt's severe instability w/ standing EOB   Stairs            Wheelchair Mobility    Modified Rankin (Stroke Patients Only)       Balance Overall balance assessment: Needs assistance Sitting-balance support: Bilateral upper extremity supported;Feet supported Sitting balance-Leahy Scale: Fair Sitting balance - Comments: Close min guard when sitting EOB 2/2 instability   Standing balance support: Bilateral upper extremity supported;During functional activity Standing balance-Leahy Scale: Zero                      Cognition Arousal/Alertness: Awake/alert Behavior During Therapy: Anxious;Impulsive Overall Cognitive Status: Within Functional Limits for tasks assessed                      Exercises Total Joint Exercises Ankle Circles/Pumps: AROM;Both;15 reps;Supine Gluteal Sets: Strengthening;Both;10 reps;Supine    General Comments General comments (skin integrity, edema, etc.): Pt not safe w/ mobility and requires +2 max assist for sit<>stand.  Therefore, updating d/c recommendation to SNF.  RN notified and message left for Dr. Mayer Camel.      Pertinent Vitals/Pain Pain Assessment: Faces Faces Pain Scale: Hurts even  more Pain Location: Rt hip Pain Descriptors / Indicators: Sore Pain Intervention(s): Limited activity within patient's tolerance;Monitored during session;Repositioned    Home Living                      Prior Function            PT Goals (current goals can now be found in the care plan section) Acute Rehab PT Goals Patient Stated Goal: none stated PT Goal  Formulation: With patient Time For Goal Achievement: 08/13/14 Potential to Achieve Goals: Good Progress towards PT goals: Progressing toward goals (very modestly)    Frequency  7X/week    PT Plan Discharge plan needs to be updated    Co-evaluation             End of Session Equipment Utilized During Treatment: Gait belt Activity Tolerance: Patient limited by fatigue;Patient limited by pain Patient left: with call bell/phone within reach;with family/visitor present;in chair (w/ OT in room)     Time: 9509-3267 PT Time Calculation (min) (ACUTE ONLY): 32 min  Charges:  $Therapeutic Exercise: 8-22 mins $Therapeutic Activity: 8-22 mins                    G Codes:      Joslyn Hy PT, DPT [phone] Pager: [phone] 08/10/2014, 10:34 AM

## 2014-08-11 LAB — CBC
HCT: 36.8 % — ABNORMAL LOW (ref 39.0–52.0)
Hemoglobin: 12.2 g/dL — ABNORMAL LOW (ref 13.0–17.0)
MCH: 31.7 pg (ref 26.0–34.0)
MCHC: 33.2 g/dL (ref 30.0–36.0)
MCV: 95.6 fL (ref 78.0–100.0)
Platelets: 119 10*3/uL — ABNORMAL LOW (ref 150–400)
RBC: 3.85 MIL/uL — AB (ref 4.22–5.81)
RDW: 14.5 % (ref 11.5–15.5)
WBC: 10.7 10*3/uL — ABNORMAL HIGH (ref 4.0–10.5)

## 2014-08-11 NOTE — Progress Notes (Signed)
Walked with patient's wife down to pathology to attain the patient's previous hip implant. Wife was told to keep the box upright while carrying it and transporting.

## 2014-08-11 NOTE — Progress Notes (Signed)
NURSING PROGRESS NOTE  Dustin Lucas [id number] Discharge Data: 08/11/2014 4:04 PM Attending Provider: Frederik Pear, MD JOI:NOMV-EHMCN,OBSJGG, MD     Karren Cobble to be D/C'd Skilled nursing facility per MD order. Report was [facility] to [facility].  Discussed with the patient the After Visit Summary and all questions fully answered. All IV's discontinued with no bleeding noted. All belongings returned to patient for patient to take home.   Last Vital Signs:  Blood pressure 141/70, pulse 78, temperature 97.8 F (36.6 C), temperature source Oral, resp. rate 17, height 6\' 3"  (1.905 m), weight 104.101 kg (229 lb 8 oz), SpO2 100 %.  Discharge Medication List   Medication List    STOP taking these medications        aspirin 81 MG tablet  Replaced by:  aspirin EC 325 MG tablet      TAKE these medications        amLODipine-valsartan 5-320 MG per tablet  Commonly known as:  EXFORGE  Take 1 tablet by mouth daily.     aspirin EC 325 MG tablet  Take 1 tablet (325 mg total) by mouth 2 (two) times daily.     dutasteride 0.5 MG capsule  Commonly known as:  AVODART  Take 0.5 mg by mouth daily.     folic acid 1 MG tablet  Commonly known as:  FOLVITE  Take 1 mg by mouth daily.     hydrochlorothiazide 25 MG tablet  Commonly known as:  HYDRODIURIL  Take 12.5 mg by mouth daily.     oxyCODONE-acetaminophen 5-325 MG per tablet  Commonly known as:  ROXICET  Take 1 tablet by mouth every 4 (four) hours as needed.     tizanidine 2 MG capsule  Commonly known as:  ZANAFLEX  Take 1 capsule (2 mg total) by mouth 3 (three) times daily.     Vitamin D-3 1000 UNITS Caps  Take 1 capsule by mouth daily.

## 2014-08-11 NOTE — Discharge Summary (Signed)
Patient ID: Dvon Jiles MRN: [id number] DOB/AGE: Mar 03, 1940 74 y.o.  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 PONV (postoperative nausea and vomiting)   . Hypertension   . Urinary frequency   . Arthritis   . Cancer     prostate cancer    Surgeries: Procedure(s): TOTAL HIP REVISION on 08/09/2014   Consultants:    Discharged Condition: Improved  Hospital Course: Martie Fulgham is an 74 y.o. male who was admitted 08/09/2014 for operative treatment ofOther mechanical complication of internal right hip prosthesis. Patient has severe unremitting pain that affects sleep, daily activities, and work/hobbies. After pre-op clearance the patient was taken to the operating room on 08/09/2014 and underwent  Procedure(s): TOTAL HIP REVISION.    Patient was given perioperative antibiotics: Anti-infectives    Start     Dose/Rate Route Frequency Ordered Stop   08/09/14 0612  ceFAZolin (ANCEF) 2-3 GM-% IVPB SOLR    Comments:  Henrine Screws   : cabinet override      08/09/14 0612 08/09/14 1829   08/09/14 0609  ceFAZolin (ANCEF) IVPB 2 g/50 mL premix     2 g 100 mL/hr over 30 Minutes Intravenous On call to O.R. 08/09/14 7322 08/09/14 0745       Patient was given sequential compression devices, early ambulation, and chemoprophylaxis to prevent DVT.  Patient benefited maximally from hospital stay and there were no complications.    Recent vital signs: Patient Vitals for the past 24 hrs:  BP Temp Temp src Pulse Resp SpO2  08/11/14 0458 (!) 141/70 mmHg 97.8 F (36.6 C) Oral 78 17 100 %  08/10/14 2258 (!) 157/70 mmHg 98.4 F (36.9 C) Oral 69 16 98 %  08/10/14 1502 (!) 107/54 mmHg 98.3 F (36.8 C) Oral 81 16 100 %     Recent laboratory studies:  Recent  Labs  08/10/14 0751 08/11/14 0451  WBC 7.2 10.7*  HGB 12.3* 12.2*  HCT 37.2* 36.8*  PLT 122* PENDING     Discharge Medications:     Medication List    STOP taking these medications        aspirin 81 MG tablet  Replaced by:  aspirin EC 325 MG tablet      TAKE these medications        amLODipine-valsartan 5-320 MG per tablet  Commonly known as:  EXFORGE  Take 1 tablet by mouth daily.     aspirin EC 325 MG tablet  Take 1 tablet (325 mg total) by mouth 2 (two) times daily.     dutasteride 0.5 MG capsule  Commonly known as:  AVODART  Take 0.5 mg by mouth daily.     folic acid 1 MG tablet  Commonly known as:  FOLVITE  Take 1 mg by mouth daily.     hydrochlorothiazide 25 MG tablet  Commonly known as:  HYDRODIURIL  Take 12.5 mg by mouth daily.     oxyCODONE-acetaminophen 5-325 MG per tablet  Commonly known as:  ROXICET  Take 1 tablet by mouth every 4 (four) hours as needed.     tizanidine 2 MG capsule  Commonly known as:  ZANAFLEX  Take 1 capsule (2 mg total) by mouth 3 (three) times daily.     Vitamin D-3 1000 UNITS Caps  Take  1 capsule by mouth daily.        Diagnostic Studies: Dg Chest 2 View  07/29/2014   CLINICAL DATA:  Pre-surgical evaluation for right hip replacement revision 08/19/2014  EXAM: CHEST  2 VIEW  COMPARISON:  07/06/2005  FINDINGS: The heart size and mediastinal contours are within normal limits. Both lungs are clear. The visualized skeletal structures are unremarkable.  IMPRESSION: No active cardiopulmonary disease.   Electronically Signed   By: Skipper Cliche M.D.   On: 07/29/2014 12:00   Dg Pelvis Portable  08/09/2014   CLINICAL DATA:  74 year old male status post total hip arthroplasty.  EXAM: PORTABLE PELVIS 1-2 VIEWS  COMPARISON:  No priors.  FINDINGS: Postoperative changes in the right hip arthroplasty noted. The femoral and acetabular components appear well seated on this single view examination, without definite periprosthetic fracture or  other immediate complicating features. The prosthetic femoral head appears to be located within the prosthetic acetabulum. Bony pelvis appears intact. Visualized portions of the left proximal femur appear intact. 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  IMPRESSION: 1. Postoperative changes of right total hip arthroplasty, as above, without acute complicating features.   Electronically Signed   By: Vinnie Langton M.D.   On: 08/09/2014 15:02    Disposition:       Discharge Instructions    Call MD / Call 911    Complete by:  As directed   If you experience chest pain or shortness of breath, CALL 911 and be transported to the hospital emergency room.  If you develope a fever above 101 F, pus (white drainage) or increased drainage or redness at the wound, or calf pain, call your surgeon's office.     Change dressing    Complete by:  As directed   You may change your dressing on day 5, then change the dressing daily with sterile 4 x 4 inch gauze dressing and paper tape.  You may clean the incision with alcohol prior to redressing     Constipation Prevention    Complete by:  As directed   Drink plenty of fluids.  Prune juice may be helpful.  You may use a stool softener, such as Colace (over the counter) 100 mg twice a day.  Use MiraLax (over the counter) for constipation as needed.     Diet - low sodium heart healthy    Complete by:  As directed      Discharge instructions    Complete by:  As directed   Follow up in office with Dr. Mayer Camel in 2 weeks.     Driving restrictions    Complete by:  As directed   No driving for 2 weeks     Follow the hip precautions as taught in Physical Therapy    Complete by:  As directed      Increase activity slowly as tolerated    Complete by:  As directed      Patient may shower    Complete by:  As directed   You may shower without a dressing once there is no drainage.  Do not wash over the wound.  If drainage remains,  cover wound with plastic wrap and then shower.           Follow-up Information    Follow up with Kerin Salen, MD.   Specialty:  Orthopedic Surgery   Contact information:   [place] [address] [phone]  Follow up with [facility].   Why:  Someone from [facility] will contact you concerning start date and time for therapy.   Contact information:   [address] [place] [address] [phone]        Signed: Hardin Negus, Marinus Eicher R 08/11/2014, 7:53 AM

## 2014-08-11 NOTE — Progress Notes (Signed)
Physical Therapy Treatment Patient Details Name: Dustin Lucas MRN: [id number] DOB: November 28, 1940 Today's Date: 08/11/2014    History of Present Illness Adm for Rt total hip revision (posterior) PMHx- prostate Ca, HTN, PONV    PT Comments    Pt demonstrated ability to ambulate 8 ft in room today w/ mod assist.  Mr. Sedore is anticipating d/c to [facility] today and continues to be most appropriate for SNF upon d/c as he currently requires +2 assist for sit<>stand transfers and ambulation.  Pt will benefit from continued skilled PT services to increase functional independence and safety.   Follow Up Recommendations  SNF;Supervision/Assistance - 24 hour     Equipment Recommendations  None recommended by PT    Recommendations for Other Services       Precautions / Restrictions Precautions Precautions: Posterior Hip;Fall Precaution Comments: Pt able to recall 3/3 hip precautions at start of session Restrictions Weight Bearing Restrictions: Yes RLE Weight Bearing: Weight bearing as tolerated    Mobility  Bed Mobility Overal bed mobility: Needs Assistance;+ 2 for safety/equipment Bed Mobility: Supine to Sit     Supine to sit: Mod assist;HOB elevated     General bed mobility comments: Mod assist for supine>sit.  Pt relies heavily on bed rails to push up and pull up to sitting. Cues for hand placement behind on bed so pt is able to push up.  Pt able to scoot pelvis to sitting EOB w/o any assist this session.  Transfers Overall transfer level: Needs assistance Equipment used: Rolling walker (2 wheeled) Transfers: Sit to/from Stand Sit to Stand: +2 physical assistance;+2 safety/equipment;Mod assist         General transfer comment: Mod +2 assist to power up to standing and to maintain balance initially.  Cues to stand upright which pt is able to maintain this session.  Ambulation/Gait Ambulation/Gait assistance: Mod assist;+2 safety/equipment Ambulation Distance (Feet): 8  Feet Assistive device: Rolling walker (2 wheeled) Gait Pattern/deviations: Step-to pattern;Antalgic;Trunk flexed;Decreased weight shift to right;Decreased stride length;Decreased stance time - right   Gait velocity interpretation: Below normal speed for age/gender General Gait Details: Cues to remain upright and for proper sequencing of RW.  After ambulating 8 ft pt reports lightheadedness which dissipates after sitting in chair.   Stairs            Wheelchair Mobility    Modified Rankin (Stroke Patients Only)       Balance Overall balance assessment: Needs assistance Sitting-balance support: Bilateral upper extremity supported;Feet supported Sitting balance-Leahy Scale: Fair     Standing balance support: Bilateral upper extremity supported;During functional activity Standing balance-Leahy Scale: Poor                      Cognition Arousal/Alertness: Awake/alert Behavior During Therapy: WFL for tasks assessed/performed Overall Cognitive Status: Within Functional Limits for tasks assessed                      Exercises Total Joint Exercises Ankle Circles/Pumps: AROM;Both;15 reps;Supine Gluteal Sets: Strengthening;Both;10 reps;Supine Hip ABduction/ADduction: AAROM;Right;10 reps;Supine    General Comments        Pertinent Vitals/Pain Pain Assessment: 0-10 Pain Score: 4  Pain Location: Rt hip Pain Descriptors / Indicators: Aching;Sore Pain Intervention(s): Limited activity within patient's tolerance;Monitored during session;Repositioned;Ice applied    Home Living                      Prior Function  PT Goals (current goals can now be found in the care plan section) Acute Rehab PT Goals Patient Stated Goal: to get stronger PT Goal Formulation: With patient Time For Goal Achievement: 08/13/14 Potential to Achieve Goals: Good Progress towards PT goals: Progressing toward goals    Frequency  7X/week    PT Plan  Current plan remains appropriate    Co-evaluation             End of Session Equipment Utilized During Treatment: Gait belt Activity Tolerance: Patient limited by fatigue Patient left: with call bell/phone within reach;with family/visitor present;in chair (w/ OT in room)     Time: 7282-0601 PT Time Calculation (min) (ACUTE ONLY): 23 min  Charges:  $Gait Training: 8-22 mins $Therapeutic Exercise: 8-22 mins                    G Codes:      Joslyn Hy PT, [place] [phone] Pager: [phone] 08/11/2014, 10:22 AM

## 2014-08-11 NOTE — Care Management Important Message (Signed)
Important Message  Patient Details  Name: Dustin Lucas MRN: [id number] Date of Birth: 03/12/1940   Medicare Important Message Given:  Yes-second notification given    Delorse Lek 08/11/2014, 11:23 AM

## 2014-08-11 NOTE — Progress Notes (Signed)
PATIENT ID: Dustin Lucas  MRN: [id number]  DOB/AGE:  [birth year] / 74 y.o.  2 Days Post-Op Procedure(s) (LRB): TOTAL HIP REVISION (Right)    PROGRESS NOTE Subjective: Patient is alert, oriented, no Nausea, no Vomiting, yes passing gas, no Bowel Movement. Taking PO well. Denies SOB, Chest or Calf Pain. Using Incentive Spirometer, PAS in place. Ambulate WBAT with pt making slow progress Patient reports pain as moderate  .    Objective: Vital signs in last 24 hours: Filed Vitals:   08/09/14 2015 08/10/14 1502 08/10/14 2258 08/11/14 0458  BP: 99/60 107/54 157/70 141/70  Pulse: 89 81 69 78  Temp: 98.2 F (36.8 C) 98.3 F (36.8 C) 98.4 F (36.9 C) 97.8 F (36.6 C)  TempSrc: Oral Oral Oral Oral  Resp: 18 16 16 17   Height:      Weight:      SpO2: 100% 100% 98% 100%      Intake/Output from previous day: I/O last 3 completed shifts: In: 720 [P.O.:720] Out: 425 [Urine:425]   Intake/Output this shift:     LABORATORY DATA:  Recent Labs  08/10/14 0751 08/11/14 0451  WBC 7.2 10.7*  HGB 12.3* 12.2*  HCT 37.2* 36.8*  PLT 122* PENDING    Examination: Neurologically intact Neurovascular intact Sensation intact distally Intact pulses distally Dorsiflexion/Plantar flexion intact Incision: dressing C/D/I and scant drainage No cellulitis present Compartment soft} XR AP&Lat of hip shows well placed\fixed THA  Assessment:   2 Days Post-Op Procedure(s) (LRB): TOTAL HIP REVISION (Right) ADDITIONAL DIAGNOSIS:  Expected Acute Blood Loss Anemia, Hypertension  Plan: PT/OT WBAT, THA  posterior precautions  DVT Prophylaxis: SCDx72 hrs, ASA 325 mg BID x 2 weeks  DISCHARGE PLAN: Skilled Nursing Facility/Rehab, [place] when bed available  DISCHARGE NEEDS: HHPT, HHRN, Walker and 3-in-1 comode seat

## 2014-08-11 NOTE — Discharge Planning (Signed)
Patient will discharge today per MD order. Patient will discharge to: [facility] SN[facility] RN to call report prior to transportation to: [phone] Transportation: [facility]  CSW sent discharge summary to SNF for review.  Packet is complete.  RN, patient and family aware of discharge plans.  Nonnie Done, [place] [phone]  Psychiatric & Orthopedics (5N 1-16) Clinical Social Worker

## 2014-08-12 ENCOUNTER — Non-Acute Institutional Stay (SKILLED_NURSING_FACILITY): Payer: Medicare PPO | Admitting: Internal Medicine

## 2014-08-12 DIAGNOSIS — K5901 Slow transit constipation: Secondary | ICD-10-CM

## 2014-08-12 DIAGNOSIS — T84090A Other mechanical complication of internal right hip prosthesis, initial encounter: Secondary | ICD-10-CM

## 2014-08-12 DIAGNOSIS — D72829 Elevated white blood cell count, unspecified: Secondary | ICD-10-CM

## 2014-08-12 DIAGNOSIS — I10 Essential (primary) hypertension: Secondary | ICD-10-CM

## 2014-08-12 DIAGNOSIS — D62 Acute posthemorrhagic anemia: Secondary | ICD-10-CM | POA: Diagnosis not present

## 2014-08-12 NOTE — Progress Notes (Signed)
Patient ID: Dustin Lucas, male   DOB: 1940/09/15, 74 y.o.   MRN: [id number]     Facility: [facility]    PCP: Benito Mccreedy, MD  Code Status: full code  No Known Allergies  Chief Complaint  Patient presents with  . New Admit To SNF     HPI:  74 y.o. patient is here for short term rehabilitation post hospital admission from 8/816-08/11/14 where he underwent total right hip revision for mechanical complication of internal right hip prosthesis. He is seen in his room. His pain is under control. He has muscle spasm and has not been asking for muscle relaxant- mentions he was not aware of being on it. Had bowel movement 4 days back and feels like he is going to have one today. Has chronic constipation with bowel movement every 2 days. Denies any concerns.  Review of Systems:  Constitutional: Negative for fever, chills, malaise/fatigue and diaphoresis.  HENT: Negative for headache, congestion, nasal discharge Eyes: Negative for eye pain, blurred vision, double vision and discharge.  Respiratory: Negative for cough, shortness of breath and wheezing.   Cardiovascular: Negative for chest pain, palpitations, leg swelling.  Gastrointestinal: Negative for heartburn, nausea, vomiting, abdominal pain Genitourinary: Negative for dysuria and flank pain.  Musculoskeletal: Negative for back pain, falls Skin: Negative for itching, rash.  Neurological: Negative for weakness,dizziness, tingling, focal weakness Psychiatric/Behavioral: Negative for depression   Past Medical History  Diagnosis Date  . PONV (postoperative nausea and vomiting)   . Hypertension   . Urinary frequency   . Arthritis   . Cancer     prostate cancer   Past Surgical History  Procedure Laterality Date  . Joint replacement      hip replacement  right  . Fracture surgery Left     plate in ankle  . Tonsillectomy    . Total hip revision Right 08/09/2014    Procedure: TOTAL HIP REVISION;   Surgeon: Frederik Pear, MD;  Location: [facility];  Service: Orthopedics;  Laterality: Right;  REVISION (ASR) RIGHT TOTAL HIP ARHROPLASTY  **INNOMET OSTEOTONES   Social History:   reports that he has been smoking Cigarettes.  He does not have any smokeless tobacco history on file. He reports that he does not drink alcohol or use illicit drugs.  No family history on file.  Medications:   Medication List       This list is accurate as of: 08/12/14  4:30 PM.  Always use your most recent med list.               amLODipine-valsartan 5-320 MG per tablet  Commonly known as:  EXFORGE  Take 1 tablet by mouth daily.     aspirin EC 325 MG tablet  Take 1 tablet (325 mg total) by mouth 2 (two) times daily.     dutasteride 0.5 MG capsule  Commonly known as:  AVODART  Take 0.5 mg by mouth daily.     folic acid 1 MG tablet  Commonly known as:  FOLVITE  Take 1 mg by mouth daily.     hydrochlorothiazide 25 MG tablet  Commonly known as:  HYDRODIURIL  Take 12.5 mg by mouth daily.     oxyCODONE-acetaminophen 5-325 MG per tablet  Commonly known as:  ROXICET  Take 1 tablet by mouth every 4 (four) hours as needed.     tizanidine 2 MG capsule  Commonly known as:  ZANAFLEX  Take 1 capsule (2 mg total) by mouth 3 (three) times  daily.     Vitamin D-3 1000 UNITS Caps  Take 1 capsule by mouth daily.         Physical Exam: Filed Vitals:   08/12/14 1630  BP: 145/78  Pulse: 80  Temp: 97 F (36.1 C)  Resp: 18  Weight: 240 lb (108.863 kg)  SpO2: 95%    General- elderly male, well built, in no acute distress Head- normocephalic, atraumatic Nose- normal nasal mucosa Throat- moist mucus membrane Eyes- PERRLA, EOMI, no pallor, no icterus, no discharge, normal conjunctiva, normal sclera Neck- no cervical lymphadenopathy Cardiovascular- normal s1,s2, no murmurs, palpable dorsalis pedis and radial pulses, trace right leg edema Respiratory- bilateral clear to auscultation, no wheeze, no  rhonchi, no crackles, no use of accessory muscles Abdomen- bowel sounds present, soft, non tender Musculoskeletal- able to move all 4 extremities, right leg rom limited at hip Neurological- no focal deficit, alert and oriented to person, place and time Skin- warm and dry, right hip surgical incision with aquacel dressing with some drainage stain on it. intact dressing. No erythema. Normal temperature in hip area on exam Psychiatry- normal mood and affect    Labs reviewed: Basic Metabolic Panel:  Recent Labs  07/29/14 1131  NA 141  K 3.6  CL 108  CO2 26  GLUCOSE 92  BUN 9  CREATININE 1.05  CALCIUM 9.0   Liver Function Tests: No results for input(s): AST, ALT, ALKPHOS, BILITOT, PROT, ALBUMIN in the last 8760 hours. No results for input(s): LIPASE, AMYLASE in the last 8760 hours. No results for input(s): AMMONIA in the last 8760 hours. CBC:  Recent Labs  07/29/14 1131 08/10/14 0751 08/11/14 0451  WBC 5.2 7.2 10.7*  NEUTROABS 2.9  --   --   HGB 15.2 12.3* 12.2*  HCT 45.1 37.2* 36.8*  MCV 95.3 95.6 95.6  PLT 172 122* 119*    Radiological Exams: Dg Chest 2 View  07/29/2014   CLINICAL DATA:  Pre-surgical evaluation for right hip replacement revision 08/19/2014  EXAM: CHEST  2 VIEW  COMPARISON:  07/06/2005  FINDINGS: The heart size and mediastinal contours are within normal limits. Both lungs are clear. The visualized skeletal structures are unremarkable.  IMPRESSION: No active cardiopulmonary disease.   Electronically Signed   By: Skipper Cliche M.D.   On: 07/29/2014 12:00     Assessment/Plan  Right hip prosthesis complication S/p right total hip revision. Pain under control. Will have him work with physical therapy and occupational therapy team to help with gait training and muscle strengthening exercises.fall precautions. Skin care. Encourage to be out of bed. Has follow up with orthopedics. Continue apsirin 325 mg bid for dvt prophylaxis. Continue roxicet 5-325 mg 1  tab q4h prn pain and zanaflex 2 mg tid for muscle spasm. Will schedule this.   Leukocytosis Afebrile, monitor aquacel dressing area- if drainage increases will need to remove dressing to evaluate further. Check cbc with diff 08/16/14  Blood loss anemia Post op, check cbc  Constipation Add colace 100 mg bid and daily miralax for now, hydration encouraged  HTN Elevated sbp, monitor bp q shift x 3 days, review reading,  Continue hctz 12.5 mg daily and exforge, check bmp   Goals of care: short term rehabilitation   Labs/tests ordered: cbc, bmp 08/16/14  Family/ staff Communication: reviewed care plan with patient and nursing supervisor    Blanchie Serve, MD  [facility] [phone] (Monday-Friday 8 am - 5 pm) [phone] (afterhours)

## 2014-08-13 LAB — BODY FLUID CULTURE: CULTURE: NO GROWTH

## 2014-08-15 LAB — ANAEROBIC CULTURE

## 2014-08-23 ENCOUNTER — Non-Acute Institutional Stay (SKILLED_NURSING_FACILITY): Payer: Medicare PPO | Admitting: Nurse Practitioner

## 2014-08-23 ENCOUNTER — Other Ambulatory Visit: Payer: Self-pay | Admitting: *Deleted

## 2014-08-23 DIAGNOSIS — D72829 Elevated white blood cell count, unspecified: Secondary | ICD-10-CM

## 2014-08-23 DIAGNOSIS — I10 Essential (primary) hypertension: Secondary | ICD-10-CM

## 2014-08-23 DIAGNOSIS — D62 Acute posthemorrhagic anemia: Secondary | ICD-10-CM

## 2014-08-23 DIAGNOSIS — T84090A Other mechanical complication of internal right hip prosthesis, initial encounter: Secondary | ICD-10-CM | POA: Diagnosis not present

## 2014-08-23 DIAGNOSIS — K5901 Slow transit constipation: Secondary | ICD-10-CM | POA: Diagnosis not present

## 2014-08-23 MED ORDER — OXYCODONE-ACETAMINOPHEN 5-325 MG PO TABS: ORAL_TABLET | ORAL | Status: DC

## 2014-08-23 NOTE — Telephone Encounter (Signed)
[facility] 

## 2014-08-23 NOTE — Progress Notes (Signed)
Patient ID: Dustin Lucas, male   DOB: Oct 14, 1940, 74 y.o.   MRN: [id number]    Nursing Home Location:  [place] of Service: SNF (31)  PCP: Benito Mccreedy, MD  No Known Allergies  Chief Complaint  Patient presents with  . Discharge Note    HPI:  Patient is a 74 y.o. male seen today at [facility] for discharge home. Pt with a hx of htn, arthritis, prostate cancer, chronic constipation. 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   Review of Systems:  Review of Systems  Constitutional: Negative for activity change, appetite change, fatigue and unexpected weight change.  HENT: Negative for congestion and hearing loss.   Eyes: Negative.   Respiratory: Negative for cough and shortness of breath.   Cardiovascular: Negative for chest pain, palpitations and leg swelling.  Gastrointestinal: Negative for abdominal pain, diarrhea and constipation.  Genitourinary: Negative for dysuria and difficulty urinating.  Musculoskeletal:       Pain controlled   Skin: Negative for color change and wound.  Neurological: Negative for dizziness and weakness.  Psychiatric/Behavioral: Negative for behavioral problems, confusion and agitation.    Past Medical History  Diagnosis Date  . PONV (postoperative nausea and vomiting)   . Hypertension   . Urinary frequency   . Arthritis   . Cancer     prostate cancer   Past Surgical History  Procedure Laterality Date  . Joint replacement      hip replacement  right  . Fracture surgery Left     plate in ankle  . Tonsillectomy    . Total hip revision Right 08/09/2014    Procedure: TOTAL HIP REVISION;  Surgeon: Frederik Pear, MD;  Location: [place];  Service: Orthopedics;   Laterality: Right;  REVISION (ASR) RIGHT TOTAL HIP ARHROPLASTY  **INNOMET OSTEOTONES   Social History:   reports that he has been smoking Cigarettes.  He does not have any smokeless tobacco history on file. He reports that he does not drink alcohol or use illicit drugs.  No family history on file.  Medications: Patient's Medications  New Prescriptions   No medications on file  Previous Medications   AMLODIPINE-VALSARTAN (EXFORGE) 5-320 MG PER TABLET    Take 1 tablet by mouth daily.   ASPIRIN EC 325 MG TABLET    Take 1 tablet (325 mg total) by mouth 2 (two) times daily.   CHOLECALCIFEROL (VITAMIN D-3) 1000 UNITS CAPS    Take 1 capsule by mouth daily.   DUTASTERIDE (AVODART) 0.5 MG CAPSULE    Take 0.5 mg by mouth daily.   FOLIC ACID (FOLVITE) 1 MG TABLET    Take 1 mg by mouth daily.   HYDROCHLOROTHIAZIDE (HYDRODIURIL) 25 MG TABLET    Take 12.5 mg by mouth daily.   OXYCODONE-ACETAMINOPHEN (ROXICET) 5-325 MG PER TABLET    Take one tablet by mouth every 4 hours as needed for pain. Do not exceed 4gm of Tylenol in 24 hours   TIZANIDINE (ZANAFLEX) 2 MG CAPSULE    Take 1 capsule (2 mg total) by mouth 3 (three) times daily.  Modified Medications   No medications on file  Discontinued Medications   No medications on file     Physical Exam: Filed Vitals:   08/23/14 1326  BP: 142/78  Pulse: 72  Temp: 97.4 F (36.3 C)  Resp: 20    Physical Exam  Constitutional: He is oriented to person, place, and time. He appears well-developed and well-nourished. No distress.  HENT:  Head: Normocephalic and atraumatic.  Mouth/Throat: Oropharynx is clear and moist. No oropharyngeal exudate.  Eyes: Conjunctivae and EOM are normal. Pupils are equal, round, and reactive to light.  Neck: Normal range of motion. Neck supple.  Cardiovascular: Normal rate, regular rhythm and normal heart sounds.   Pulmonary/Chest: Effort normal and breath sounds normal.  Abdominal: Soft. Bowel sounds are normal.    Musculoskeletal: He exhibits no edema or tenderness.  Neurological: He is alert and oriented to person, place, and time.  Skin: Skin is warm and dry. He is not diaphoretic.  right hip surgical incision with aquacel dressing, no erythema or heat to area  Psychiatric: He has a normal mood and affect.    Labs reviewed: Basic Metabolic Panel:  Recent Labs  07/29/14 1131  NA 141  K 3.6  CL 108  CO2 26  GLUCOSE 92  BUN 9  CREATININE 1.05  CALCIUM 9.0   Liver Function Tests: No results for input(s): AST, ALT, ALKPHOS, BILITOT, PROT, ALBUMIN in the last 8760 hours. No results for input(s): LIPASE, AMYLASE in the last 8760 hours. No results for input(s): AMMONIA in the last 8760 hours. CBC:  Recent Labs  07/29/14 1131 08/10/14 0751 08/11/14 0451  WBC 5.2 7.2 10.7*  NEUTROABS 2.9  --   --   HGB 15.2 12.3* 12.2*  HCT 45.1 37.2* 36.8*  MCV 95.3 95.6 95.6  PLT 172 122* 119*   TSH: No results for input(s): TSH in the last 8760 hours. A1C: No results found for: HGBA1C Lipid Panel: No results for input(s): CHOL, HDL, LDLCALC, TRIG, CHOLHDL, LDLDIRECT in the last 8760 hours.  Result Date: 08/16/14 10:43 AM      Analyte   Result Value   Ref. Range    Units   Out of Range   Lab  WBC  5.1  4.0-10.5  K/uL    SLN  RBC  3.66  4.22-5.81  MIL/uL  L    Hemoglobin  11.3  13.0-17.0  g/dL  L    Hematocrit  34.0  39.0-52.0  %  L    MCV  92.9  78.0-100.0  fL      MCH  30.9  26.0-34.0  pg      MCHC  33.2  30.0-36.0  g/dL      RDW  14.2  11.5-15.5  %      Platelet Count  202  150-400  K/uL      MPV  9.6  8.6-12.4  fL      Basic Metabolic Panel  Status: Final   Result Date: 08/16/14 10:43 AM      Analyte   Result Value   Ref. Range    Units   Out of Range   Lab  Sodium  140  135-146  mmol/L    SLN  Potassium  4.4  3.5-5.3  mmol/L      Chloride  102  98-110  mmol/L      CO2  29  20-31  mmol/L      Glucose  91  65-99  mg/dL      BUN  19  7-25  mg/dL      Creatinine  0.98  0.70-1.18   mg/dL      Calcium  8.7  Radiological Exams: Dg Chest 2 View  07/29/2014   CLINICAL DATA:  Pre-surgical evaluation for right hip replacement revision 08/19/2014  EXAM: CHEST  2 VIEW  COMPARISON:  07/06/2005  FINDINGS: The heart size and mediastinal contours are within normal limits. Both lungs are clear. The visualized skeletal structures are unremarkable.  IMPRESSION: No active cardiopulmonary disease.   Electronically Signed   By: Skipper Cliche M.D.   On: 07/29/2014 12:00    Assessment/Plan 1. 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 Pain under control on current regimen -doing well with  physical therapy and occupational therapy team to help with gait training and muscle strengthening exercises. -Continue apsirin 81 mg  -Continue roxicet 5-325 mg 1 tab q4h prn pain and zanaflex 2 mg tid for muscle spasm. -ongoing ortho follow up   2. Acute blood loss anemia Stable, Recent hgb of 11.3, will need further ongoing follow up and evaluation by PCP.  3. Slow transit constipation Stable, conts OTC medication with good relief   4. Essential hypertension, benign Blood pressures stable, conts on amlodipine-valsartan 5-320 mg daily   5. Leukocytosis Resolved on recent labs   pt is stable for discharge-will dc with outpatient therapy. No DME needed. Rx written.  will need to follow up with PCP within 2 weeks.     Carlos American. Harle Battiest  [facility] [phone] 8 am - 5 pm) [phone] (after hours)

## 2015-08-09 ENCOUNTER — Other Ambulatory Visit: Payer: Self-pay | Admitting: Internal Medicine

## 2015-08-09 DIAGNOSIS — R748 Abnormal levels of other serum enzymes: Secondary | ICD-10-CM

## 2015-08-12 ENCOUNTER — Ambulatory Visit
Admission: RE | Admit: 2015-08-12 | Discharge: 2015-08-12 | Disposition: A | Discharge status: Home / Self Care | Payer: Medicare Other | Source: Ambulatory Visit | Attending: Internal Medicine | Admitting: Internal Medicine

## 2015-08-12 DIAGNOSIS — R748 Abnormal levels of other serum enzymes: Secondary | ICD-10-CM

## 2015-08-18 ENCOUNTER — Observation Stay (HOSPITAL_COMMUNITY)
Admission: AD | Admit: 2015-08-18 | Discharge: 2015-08-22 | Disposition: A | Discharge status: Home / Self Care | Payer: Medicare Other | Source: Ambulatory Visit | Attending: Internal Medicine | Admitting: Internal Medicine

## 2015-08-18 ENCOUNTER — Observation Stay (HOSPITAL_COMMUNITY): Payer: Medicare Other

## 2015-08-18 DIAGNOSIS — K8020 Calculus of gallbladder without cholecystitis without obstruction: Secondary | ICD-10-CM | POA: Diagnosis present

## 2015-08-18 DIAGNOSIS — R634 Abnormal weight loss: Secondary | ICD-10-CM | POA: Insufficient documentation

## 2015-08-18 DIAGNOSIS — K831 Obstruction of bile duct: Secondary | ICD-10-CM | POA: Insufficient documentation

## 2015-08-18 DIAGNOSIS — R945 Abnormal results of liver function studies: Secondary | ICD-10-CM | POA: Diagnosis present

## 2015-08-18 DIAGNOSIS — E876 Hypokalemia: Secondary | ICD-10-CM | POA: Diagnosis present

## 2015-08-18 DIAGNOSIS — R7989 Other specified abnormal findings of blood chemistry: Secondary | ICD-10-CM | POA: Diagnosis not present

## 2015-08-18 DIAGNOSIS — F17210 Nicotine dependence, cigarettes, uncomplicated: Secondary | ICD-10-CM | POA: Diagnosis not present

## 2015-08-18 DIAGNOSIS — R17 Unspecified jaundice: Secondary | ICD-10-CM | POA: Diagnosis not present

## 2015-08-18 DIAGNOSIS — R935 Abnormal findings on diagnostic imaging of other abdominal regions, including retroperitoneum: Secondary | ICD-10-CM | POA: Insufficient documentation

## 2015-08-18 DIAGNOSIS — Z859 Personal history of malignant neoplasm, unspecified: Secondary | ICD-10-CM | POA: Diagnosis not present

## 2015-08-18 DIAGNOSIS — I10 Essential (primary) hypertension: Secondary | ICD-10-CM | POA: Diagnosis not present

## 2015-08-18 DIAGNOSIS — Z79899 Other long term (current) drug therapy: Secondary | ICD-10-CM | POA: Insufficient documentation

## 2015-08-18 LAB — CBC WITH DIFFERENTIAL/PLATELET
BASOS ABS: 0 10*3/uL (ref 0.0–0.1)
BASOS PCT: 0 %
EOS ABS: 0 10*3/uL (ref 0.0–0.7)
EOS PCT: 1 %
HCT: 38.2 % — ABNORMAL LOW (ref 39.0–52.0)
Hemoglobin: 13.6 g/dL (ref 13.0–17.0)
LYMPHS PCT: 30 %
Lymphs Abs: 1.5 10*3/uL (ref 0.7–4.0)
MCH: 32.1 pg (ref 26.0–34.0)
MCHC: 35.6 g/dL (ref 30.0–36.0)
MCV: 90.1 fL (ref 78.0–100.0)
MONO ABS: 0.6 10*3/uL (ref 0.1–1.0)
Monocytes Relative: 11 %
Neutro Abs: 2.9 10*3/uL (ref 1.7–7.7)
Neutrophils Relative %: 58 %
PLATELETS: 172 10*3/uL (ref 150–400)
RBC: 4.24 MIL/uL (ref 4.22–5.81)
RDW: 17 % — AB (ref 11.5–15.5)
WBC: 5 10*3/uL (ref 4.0–10.5)

## 2015-08-18 LAB — PROTIME-INR
INR: 1.17
PROTHROMBIN TIME: 14.9 s (ref 11.4–15.2)

## 2015-08-18 LAB — COMPREHENSIVE METABOLIC PANEL
ALK PHOS: 244 U/L — AB (ref 38–126)
ALT: 209 U/L — AB (ref 17–63)
AST: 179 U/L — AB (ref 15–41)
Albumin: 3.1 g/dL — ABNORMAL LOW (ref 3.5–5.0)
Anion gap: 8 (ref 5–15)
BILIRUBIN TOTAL: 15.3 mg/dL — AB (ref 0.3–1.2)
BUN: 11 mg/dL (ref 6–20)
CALCIUM: 9 mg/dL (ref 8.9–10.3)
CO2: 26 mmol/L (ref 22–32)
CREATININE: 0.75 mg/dL (ref 0.61–1.24)
Chloride: 106 mmol/L (ref 101–111)
Glucose, Bld: 97 mg/dL (ref 65–99)
Potassium: 3.3 mmol/L — ABNORMAL LOW (ref 3.5–5.1)
Sodium: 140 mmol/L (ref 135–145)
TOTAL PROTEIN: 6.9 g/dL (ref 6.5–8.1)

## 2015-08-18 LAB — APTT: APTT: 33 s (ref 24–36)

## 2015-08-18 LAB — MAGNESIUM: MAGNESIUM: 2 mg/dL (ref 1.7–2.4)

## 2015-08-18 MED ORDER — ACETAMINOPHEN 650 MG RE SUPP: 650.0000 mg | Freq: Four times a day (QID) | RECTAL | Status: DC | PRN

## 2015-08-18 MED ORDER — POTASSIUM CHLORIDE CRYS ER 20 MEQ PO TBCR
40.0000 meq | EXTENDED_RELEASE_TABLET | Freq: Once | ORAL | Status: AC
  Administered 2015-08-19: 40 meq via ORAL
  Filled 2015-08-18: qty 2

## 2015-08-18 MED ORDER — HYDROCODONE-ACETAMINOPHEN 5-325 MG PO TABS: 1.0000 | ORAL_TABLET | ORAL | Status: DC | PRN

## 2015-08-18 MED ORDER — ONDANSETRON HCL 4 MG PO TABS: 4.0000 mg | ORAL_TABLET | Freq: Four times a day (QID) | ORAL | Status: DC | PRN

## 2015-08-18 MED ORDER — FOLIC ACID 0.5 MG HALF TAB
500.0000 ug | ORAL_TABLET | Freq: Every day | ORAL | Status: DC
  Administered 2015-08-19 – 2015-08-22 (×4): 0.5 mg via ORAL
  Filled 2015-08-18 (×4): qty 1

## 2015-08-18 MED ORDER — SODIUM CHLORIDE 0.9 % IV SOLN
INTRAVENOUS | Status: AC
  Administered 2015-08-18: 22:00:00 via INTRAVENOUS

## 2015-08-18 MED ORDER — AMLODIPINE BESYLATE 5 MG PO TABS
5.0000 mg | ORAL_TABLET | Freq: Every day | ORAL | Status: DC
  Administered 2015-08-19 – 2015-08-22 (×4): 5 mg via ORAL
  Filled 2015-08-18 (×4): qty 1

## 2015-08-18 MED ORDER — ONDANSETRON HCL 4 MG/2ML IJ SOLN: 4.0000 mg | Freq: Four times a day (QID) | INTRAMUSCULAR | Status: DC | PRN

## 2015-08-18 MED ORDER — AMLODIPINE BESYLATE-VALSARTAN 5-320 MG PO TABS: 1.0000 | ORAL_TABLET | Freq: Every day | ORAL | Status: DC

## 2015-08-18 MED ORDER — DUTASTERIDE 0.5 MG PO CAPS
0.5000 mg | ORAL_CAPSULE | Freq: Every day | ORAL | Status: DC
  Administered 2015-08-19 – 2015-08-22 (×4): 0.5 mg via ORAL
  Filled 2015-08-18 (×4): qty 1

## 2015-08-18 MED ORDER — ACETAMINOPHEN 325 MG PO TABS
650.0000 mg | ORAL_TABLET | Freq: Four times a day (QID) | ORAL | Status: DC | PRN
  Administered 2015-08-21: 650 mg via ORAL
  Filled 2015-08-18: qty 2

## 2015-08-18 MED ORDER — IRBESARTAN 150 MG PO TABS
300.0000 mg | ORAL_TABLET | Freq: Every day | ORAL | Status: DC
  Administered 2015-08-19 – 2015-08-22 (×4): 300 mg via ORAL
  Filled 2015-08-18 (×5): qty 2

## 2015-08-18 MED ORDER — FAMOTIDINE 20 MG PO TABS
20.0000 mg | ORAL_TABLET | Freq: Two times a day (BID) | ORAL | Status: DC
  Administered 2015-08-18 – 2015-08-22 (×7): 20 mg via ORAL
  Filled 2015-08-18 (×7): qty 1

## 2015-08-18 NOTE — H&P (Signed)
History and Physical    Dustin Lucas WJX:[id number] DOB: 26-Dec-1940 DOA: 08/18/2015  PCP: Benito Mccreedy, MD   Patient coming from: PCP's office  Chief Complaint: Abnormal LFTs, abnormal abdominal ultrasound  HPI: Dustin Lucas is a 75 y.o. gentleman with a history of HTN, localized prostate cancer (prostate biopsy done but never treated per patient; followed twice yearly by his urologist), and arthritis with history of right hip replacement who was referred for direct admission by his PCP for abnormal LFTs and abnormal abdominal ultrasound.  The patient is accompanied by his wife Dustin Lucas.  He reports a one month history of decreased appetite, increased fatigue, and a 10 lb weight loss.  He denies associated abdominal pain, nausea, or vomiting.  He has had intermittent loose stools but he denies diarrhea (more than 2-3 stools daily).  No chest pain or shortness of breath.  He has had increased acid reflux symptoms (burning sensation in chest) and increased gas.  He had not noted scleral icterus until it was pointed out by his PCP.  He initially presented to this PCP with complaints of "dark urine".  UTI was ruled out.  He denies hematuria.  He has not had BRBPR.    Outpatient labs ultimately revealed abnormal LFTs, with a total bilirubin of 13.0, direct bilirubin on 10.6, alk phos 292, AST 228, ALT 337.  Abdominal ultrasound done on 8/11 showed gallbladder stones and sludge but no gallbladder wall thickening, pericholecystic fluid, or sonographic Murphy's sign to suggest acute cholecystitis.  Common bile duct dilated to 1.6 cm.  The patient was referred for direct admission for further evaluation and GI consultation.  Case discussed briefly with Dr. Collene Mares (on call for Dr. Benson Norway) after patient arrived.  CT abdomen/pelvis recommended.  The patient reports a shellfish allergy (tongue/throat swelling), and he is currently declining contrast administration.  He has been advised that we can pre-treat with  a combination of steroids and benadryl to reduce his risk of adverse reaction, but he is declining this option at this time.  Patient has been advised that images will be limited without contrast, and he voiced an understanding.  He wants to proceed with noncontrasted CT for now.  Wife at bedside and is in agreement.   Review of Systems: As per HPI otherwise 10 point review of systems negative.    Past Medical History:  Diagnosis Date  . Arthritis   . Cancer    prostate cancer  . Hypertension   . PONV (postoperative nausea and vomiting)   . Urinary frequency     Past Surgical History:  Procedure Laterality Date  . FRACTURE SURGERY Left    plate in ankle  . JOINT REPLACEMENT     hip replacement  right  . TONSILLECTOMY    . TOTAL HIP REVISION Right 08/09/2014   Procedure: TOTAL HIP REVISION;  Surgeon: Frederik Pear, MD;  Location: [place];  Service: Orthopedics;  Laterality: Right;  REVISION (ASR) RIGHT TOTAL HIP ARHROPLASTY  **INNOMET OSTEOTONES   SOCIAL HISTORY: He denies any history of tobacco use.  Had an occasional alcoholic beverage in the past, but no significant history of EtOH use.  No illicit drug use.  He is married.  He has two adult children.  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   Allergies  Allergen Reactions  . Shellfish Allergy Swelling   FAMILY HISTORY: He denies history of GI malignancy including esophageal, stomach, pancreatic, or colon cancers.  Prior to Admission medications  Medication Sig Start Date End Date Taking? Authorizing Provider  amLODipine-valsartan (EXFORGE) 5-320 MG per tablet Take 1 tablet by mouth daily. 07/20/14  Yes Historical Provider, MD  aspirin 81 MG chewable tablet Chew by mouth daily.   Yes Historical Provider, MD  Cholecalciferol (VITAMIN D-3) 1000 UNITS CAPS Take 2,000 Units by mouth daily.    Yes Historical Provider, MD  dutasteride (AVODART) 0.5 MG capsule Take 0.5 mg by mouth daily. 07/12/14  Yes  Historical Provider, MD  folic acid (FOLVITE) 017 MCG tablet Take 400 mcg by mouth daily.   Yes Historical Provider, MD    Physical Exam: Vitals:   08/18/15 2015  BP: (!) 160/83  Pulse: 69  Resp: 18  Temp: 99.3 F (37.4 C)  TempSrc: Oral  SpO2: 100%  Weight: 97.1 kg (214 lb 1.1 oz)  Height: 6' 3"  (1.905 m)      Constitutional: NAD, calm, comfortable Eyes: PERRL, + scleral icterus ENMT: Mucous membranes are moist. Posterior pharynx clear of any exudate or lesions. Normal dentition.  Neck: normal appearance, supple, no masses Respiratory: clear to auscultation bilaterally, no wheezing, no crackles. Normal respiratory effort. No accessory muscle use.  Cardiovascular: Normal rate, regular rhythm, no murmurs / rubs / gallops. No extremity edema. 2+ pedal pulses.  GI: abdomen is soft and compressible.  No distention.  No tenderness.  No masses palpated.  Bowel sounds are present.  NO Murphy's sign elicited on physical exam. Musculoskeletal:  No joint deformity in upper and lower extremities. Good ROM, no contractures. Normal muscle tone.  Skin: no rashes, warm and dry Neurologic: CN 2-12 grossly intact. Sensation intact, Strength symmetric bilaterally, 5/5  Psychiatric: Normal judgment and insight. Alert and oriented x 3. Normal mood.     Labs on Admission: I have personally reviewed following labs and imaging studies  CBC:  Recent Labs Lab 08/18/15 1949  WBC 5.0  NEUTROABS 2.9  HGB 13.6  HCT 38.2*  MCV 90.1  PLT 510   Basic Metabolic Panel:  Recent Labs Lab 08/18/15 1949  NA 140  K 3.3*  CL 106  CO2 26  GLUCOSE 97  BUN 11  CREATININE 0.75  CALCIUM 9.0  MG 2.0   GFR: CrCl cannot be calculated (Unknown ideal weight.). Liver Function Tests:  Recent Labs Lab 08/18/15 1949  AST 179*  ALT 209*  ALKPHOS 244*  BILITOT 15.3*  PROT 6.9  ALBUMIN 3.1*   Coagulation Profile:  Recent Labs Lab 08/18/15 1949  INR 1.17    Radiological Exams on  Admission: CT abdomen and pelvis without contrast pending.   Assessment/Plan Active Problems:   Jaundice   Abnormal LFTs   Weight loss      Painless jaundice/scleral icterus with abnormal LFTs, weight loss, and dilated common bile duct on abdominal ultrasound --GI consult by Dr. Benson Norway anticipated for the morning --CT abdomen and pelvis without contrast, per patient wishes/as documented above, tonight --Does not appear to have acute cholecystitis at this point, so general surgery consultation has been deferred for now --High index of suspicion for occult malignancy based on history.  Defer additional work-up for abnormal LFTs to GI attending for now. --Clear liquid diet for now, NPO after midnight --HOLDING aspirin in anticipation of invasive procedure --Normal INR noted  HTN --Continue amlodipine/valsartan for now  History of prostate cancer --Continue home dose of avodart for now  Mild hypokalemia --Replacement ordered, repeat BMP in the AM  DVT prophylaxis: SCDs Code Status: FULL CODE Family Communication: Wife Dustin Lucas at bedside  at time of admission Disposition Plan: To be determined Consults called: GI: Dr. Adriana Mccallum; Dr. Benson Norway should see in the AM Admission status: Observation, med surg   TIME SPENT: 75 minutes   Eber Jones MD [facility] Pager [phone]  If 7PM-7AM, please contact night-coverage [email or web address] Password [email or web address]  08/18/2015, 8:14 PM

## 2015-08-19 ENCOUNTER — Encounter (HOSPITAL_COMMUNITY): Payer: Self-pay

## 2015-08-19 DIAGNOSIS — K8020 Calculus of gallbladder without cholecystitis without obstruction: Secondary | ICD-10-CM

## 2015-08-19 DIAGNOSIS — I10 Essential (primary) hypertension: Secondary | ICD-10-CM | POA: Diagnosis not present

## 2015-08-19 DIAGNOSIS — K8050 Calculus of bile duct without cholangitis or cholecystitis without obstruction: Secondary | ICD-10-CM | POA: Diagnosis not present

## 2015-08-19 DIAGNOSIS — R945 Abnormal results of liver function studies: Secondary | ICD-10-CM | POA: Diagnosis not present

## 2015-08-19 DIAGNOSIS — R17 Unspecified jaundice: Secondary | ICD-10-CM | POA: Diagnosis not present

## 2015-08-19 DIAGNOSIS — R7989 Other specified abnormal findings of blood chemistry: Secondary | ICD-10-CM | POA: Diagnosis not present

## 2015-08-19 LAB — URINE MICROSCOPIC-ADD ON

## 2015-08-19 LAB — URINALYSIS, ROUTINE W REFLEX MICROSCOPIC
GLUCOSE, UA: NEGATIVE mg/dL
HGB URINE DIPSTICK: NEGATIVE
Ketones, ur: NEGATIVE mg/dL
Nitrite: POSITIVE — AB
PH: 6 (ref 5.0–8.0)
Protein, ur: 30 mg/dL — AB
SPECIFIC GRAVITY, URINE: 1.022 (ref 1.005–1.030)

## 2015-08-19 LAB — GLUCOSE, CAPILLARY: Glucose-Capillary: 89 mg/dL (ref 65–99)

## 2015-08-19 MED ORDER — CIPROFLOXACIN HCL 500 MG PO TABS
500.0000 mg | ORAL_TABLET | Freq: Two times a day (BID) | ORAL | Status: DC
  Administered 2015-08-19 – 2015-08-22 (×7): 500 mg via ORAL
  Filled 2015-08-19 (×7): qty 1

## 2015-08-19 NOTE — Consult Note (Signed)
Reason for Consult: Obstructive jaundice  Referring Physician: [facility]  Dustin Lucas HPI: This is a 75 year old male with a PMH of a right hip replacement, left ankle fracture with plate, prostate cancer, and HTN admitted for elevated liver enzymes and jaundice.  He reports having dark urine for the past month and he was being evaluated by Dustin Lucas.  Two weeks ago he noticed an overt jaundice in his sclera.  Over this interval time period he estimates a 10 lbs weight loss and anorexia.  Also, he feels that he has some worsening GERD issues.  Blood work from Dustin Lucas office revealed a TB of 13, AP 292, AST 228, and ALT 337.  An ultrasound was performed on 08/12/2015 and there was CBD ductal dilation at 1.6 cm.  Stones and sludge were noted in the gallbladder.  As a result of the findings he was directly admitted to the hospital.  A noncontrast CT scan of the abdomen essentially revealed a double duct sign.  He refused contrast as he has a shellfish allergy.  The CBD was noted to be at 2 cm and there is the possibility of a mass in the head of the pancreas.    Past Medical History:  Diagnosis Date  . Arthritis   . Cancer [facility])    prostate cancer  . Hypertension   . PONV (postoperative nausea and vomiting)   . Urinary frequency     Past Surgical History:  Procedure Laterality Date  . FRACTURE SURGERY Left    plate in ankle  . JOINT REPLACEMENT     hip replacement  right  . TONSILLECTOMY    . TOTAL HIP REVISION Right 08/09/2014   Procedure: TOTAL HIP REVISION;  Surgeon: Dustin Pear, MD;  Location: [place];  Service: Orthopedics;  Laterality: Right;  REVISION (ASR) RIGHT TOTAL HIP ARHROPLASTY  **INNOMET OSTEOTONES    History reviewed. No pertinent family history.  Social History:  reports that he has been smoking Cigarettes.  He has never used smokeless tobacco. He reports that he does not drink alcohol or use drugs.  Allergies:  Allergies  Allergen Reactions  .  Shellfish Allergy Swelling    Medications:  Scheduled: . amLODipine  5 mg Oral Daily   And  . irbesartan  300 mg Oral Daily  . dutasteride  0.5 mg Oral Daily  . famotidine  20 mg Oral BID  . folic acid  [id number] mcg Oral Daily   Continuous:   Results for orders placed or performed during the hospital encounter of 08/18/15 (from the past 24 hour(s))  Comprehensive metabolic panel     Status: Abnormal   Collection Time: 08/18/15  7:49 PM  Result Value Ref Range   Sodium 140 135 - 145 mmol/L   Potassium 3.3 (L) 3.5 - 5.1 mmol/L   Chloride 106 101 - 111 mmol/L   CO2 26 22 - 32 mmol/L   Glucose, Bld 97 65 - 99 mg/dL   BUN 11 6 - 20 mg/dL   Creatinine, Ser 0.75 0.61 - 1.24 mg/dL   Calcium 9.0 8.9 - 10.3 mg/dL   Total Protein 6.9 6.5 - 8.1 g/dL   Albumin 3.1 (L) 3.5 - 5.0 g/dL   AST 179 (H) 15 - 41 U/L   ALT 209 (H) 17 - 63 U/L   Alkaline Phosphatase 244 (H) 38 - 126 U/L   Total Bilirubin 15.3 (H) 0.3 - 1.2 mg/dL   GFR calc non Af Amer >60 >60 mL/min  GFR calc Af Amer >60 >60 mL/min   Anion gap 8 5 - 15  Magnesium     Status: None   Collection Time: 08/18/15  7:49 PM  Result Value Ref Range   Magnesium 2.0 1.7 - 2.4 mg/dL  CBC WITH DIFFERENTIAL     Status: Abnormal   Collection Time: 08/18/15  7:49 PM  Result Value Ref Range   WBC 5.0 4.0 - 10.5 K/uL   RBC 4.24 4.22 - 5.81 MIL/uL   Hemoglobin 13.6 13.0 - 17.0 g/dL   HCT 38.2 (L) 39.0 - 52.0 %   MCV 90.1 78.0 - 100.0 fL   MCH 32.1 26.0 - 34.0 pg   MCHC 35.6 30.0 - 36.0 g/dL   RDW 17.0 (H) 11.5 - 15.5 %   Platelets 172 150 - 400 K/uL   Neutrophils Relative % 58 %   Neutro Abs 2.9 1.7 - 7.7 K/uL   Lymphocytes Relative 30 %   Lymphs Abs 1.5 0.7 - 4.0 K/uL   Monocytes Relative 11 %   Monocytes Absolute 0.6 0.1 - 1.0 K/uL   Eosinophils Relative 1 %   Eosinophils Absolute 0.0 0.0 - 0.7 K/uL   Basophils Relative 0 %   Basophils Absolute 0.0 0.0 - 0.1 K/uL  Protime-INR     Status: None   Collection Time: 08/18/15  7:49 PM   Result Value Ref Range   Prothrombin Time 14.9 11.4 - 15.2 seconds   INR 1.17   APTT     Status: None   Collection Time: 08/18/15  7:49 PM  Result Value Ref Range   aPTT 33 24 - 36 seconds  Urinalysis, Routine w reflex microscopic (not at [facility])     Status: Abnormal   Collection Time: 08/19/15  1:26 AM  Result Value Ref Range   Color, Urine RED (A) YELLOW   APPearance CLOUDY (A) CLEAR   Specific Gravity, Urine 1.022 1.005 - 1.030   pH 6.0 5.0 - 8.0   Glucose, UA NEGATIVE NEGATIVE mg/dL   Hgb urine dipstick NEGATIVE NEGATIVE   Bilirubin Urine LARGE (A) NEGATIVE   Ketones, ur NEGATIVE NEGATIVE mg/dL   Protein, ur 30 (A) NEGATIVE mg/dL   Nitrite POSITIVE (A) NEGATIVE   Leukocytes, UA SMALL (A) NEGATIVE  Urine microscopic-add on     Status: Abnormal   Collection Time: 08/19/15  1:26 AM  Result Value Ref Range   Squamous Epithelial / LPF 0-5 (A) NONE SEEN   WBC, UA 0-5 0 - 5 WBC/hpf   RBC / HPF 0-5 0 - 5 RBC/hpf   Bacteria, UA FEW (A) NONE SEEN   Casts HYALINE CASTS (A) NEGATIVE     Ct Abdomen Pelvis Wo Contrast  Result Date: 08/18/2015 CLINICAL DATA:  Acute onset of abnormal LFTs.  Initial encounter. EXAM: CT ABDOMEN AND PELVIS WITHOUT CONTRAST TECHNIQUE: Multidetector CT imaging of the abdomen and pelvis was performed following the standard protocol without IV contrast. COMPARISON:  CT of the abdomen and pelvis from 12/05/2010 FINDINGS: The visualized lung bases are clear. Mild coronary artery calcifications are seen. There is marked dilatation of the common bile duct to 2.0 cm in diameter, with diffuse distention of the gallbladder. Small stones are seen within the gallbladder. Diffuse intrahepatic biliary ductal dilatation is seen. This raises concern for distal obstruction. Underlying mass cannot be excluded. There is also dilatation of the pancreatic duct to 9 mm in diameter. The spleen is unremarkable in appearance. Vague nonspecific fullness is noted about the head of  the  pancreas. The adrenal glands are grossly unremarkable. Scattered bilateral renal cysts are seen. The kidneys are otherwise grossly unremarkable. There is no evidence of hydronephrosis. No renal or ureteral stones are identified. No perinephric stranding is seen. No free fluid is identified. The small bowel is unremarkable in appearance. The stomach is within normal limits. No acute vascular abnormalities are seen. Scattered calcification is seen along the abdominal aorta and its branches. The appendix is normal in caliber and contains air, without evidence of appendicitis. The sigmoid colon is somewhat redundant. The colon is grossly unremarkable in appearance. The bladder is moderately distended, with scattered diverticula noted. The prostate is borderline enlarged, measuring 4.9 cm in transverse dimension. No inguinal lymphadenopathy is seen. No acute osseous abnormalities are identified. The patient's right hip arthroplasty is incompletely imaged but appears grossly unremarkable. There is mild grade 1 anterolisthesis of L4 on L5. Vacuum phenomenon is noted at L5-S1. IMPRESSION: 1. Marked dilatation of the common bile duct to 2.0 cm in diameter, with diffuse distention of the gallbladder. Underlying cholelithiasis noted. Diffuse intrahepatic biliary ductal dilatation seen. This raises concern for distal obstruction. Underlying mass cannot be excluded. Associated dilatation of the pancreatic duct to 9 mm in diameter. Vague nonspecific fullness noted about the head of the pancreas. MRCP is recommended for further evaluation. 2. Scattered calcification along the abdominal aorta and its branches. 3. Mild coronary artery calcifications seen. 4. Scattered bilateral renal cysts noted. 5. Borderline enlarged prostate. Electronically Signed   By: Garald Balding M.D.   On: 08/18/2015 21:50    ROS:  As stated above in the HPI otherwise negative.  Blood pressure (!) 147/80, pulse 71, temperature 98 F (36.7 C),  temperature source Oral, resp. rate 16, height 6\' 3"  (1.905 m), weight 97 kg (213 lb 14.4 oz), SpO2 100 %.    PE: Gen: NAD, Alert and Oriented HEENT:  [place]/AT, EOMI Neck: Supple, no LAD Lungs: CTA Bilaterally CV: RRR without M/G/R ABM: Soft, NTND, +BS Ext: No C/C/E  Assessment/Plan: 1) Obstructive jaundice. 2) Abnormal liver enzymes. 3) Cholelithiasis.   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  An MRCP was recommended for further elucidation of the situation and he is agreeable to the scan.  More importantly, an ERCP with stent placement will be required.  I am very certain that this is a malignant source and I will place a covered metallic stent.  Plan: 1) MRCP. 2) ERCP with stent placement tomorrow. 3) Ciprofloxacin to prophylax against ascending cholangitis.  Chrishelle Zito D 08/19/2015, 8:23 AM

## 2015-08-19 NOTE — Progress Notes (Addendum)
Patient ID: Dustin Lucas, male   DOB: 21-May-1940, 75 y.o.   MRN: [id number]  PROGRESS NOTE    Dustin Lucas  [id number] DOB: 06/17/1940 DOA: 08/18/2015  PCP: Benito Mccreedy, MD   Brief Narrative:  75 year old male with past medical history of hip replacement, left ankle fracture, prostate cancer, hypertension who presented to [facility] hospital with jaundice, dark urine for past month. Patient also reported about 10 pound weight loss and poor by mouth intake for the same amount of time. He went to see primary care physician and workup in their office revealed AST 228, ALT 337, ALP 292 and total bili of 13. Ultrasound performed 08/12/2015 showed CBD ductal dilatation at 1.6 cm with stones and sludge noted in gallbladder. He was directly admitted to the hospital. Noncontrast CT scan of the abdomen showed dilatation of the common bile duct up to 2 cm in diameter and diffuse distention of the gallbladder with underlying cholelithiasis, raising concern for distal obstruction. Mass cannot be excluded. There is associated dilatation of the pancreatic duct up to 9 mm in diameter. Patient was seen by GI in consultation and plan is for ERCP.   Assessment & Plan:   Active Problems: Obstructive jaundice / abnormal LFTs / cholelithiasis - Abdominal ultrasound performed 08/12/2015 showed CBD ductal dilatation at 1.6 cm with stones and sludge noted in gallbladder - CT scan of the abdomen showed dilatation of the common bile duct up to 2 cm in diameter and diffuse distention of the gallbladder with underlying cholelithiasis, raising concern for distal obstruction. Mass cannot be excluded. There is associated dilatation of the pancreatic duct up to 9 mm in diameter.  - Patient was seen by GI in consultation and plan is for ERCP - Continue empiric ciprofloxacin 500 mg twice daily - Continue supportive care with fluids  Essential hypertension - Continue Norvasc and of Avapro   DVT prophylaxis: SCDs  bilaterally Code Status: full code  Family Communication: No family at the bedside Disposition Plan: Home once cleared by GI   Consultants:   GI, Dr. Carol Ada   Procedures:   Plan for ERCP today   Antimicrobials:   Cipro 08/18/2015 -->   Subjective: No overnight events.   Objective: Vitals:   08/18/15 2015 08/19/15 0500 08/19/15 0611  BP: (!) 160/83  (!) 147/80  Pulse: 69  71  Resp: 18  16  Temp: 99.3 F (37.4 C)  98 F (36.7 C)  TempSrc: Oral  Oral  SpO2: 100%  100%  Weight: 97.1 kg (214 lb 1.1 oz) 97 kg (213 lb 14.4 oz)   Height: 6\' 3"  (1.905 m)      Intake/Output Summary (Last 24 hours) at 08/19/15 1032 Last data filed at 08/19/15 0456  Gross per 24 hour  Intake              624 ml  Output                0 ml  Net              624 ml   Filed Weights   08/18/15 2015 08/19/15 0500  Weight: 97.1 kg (214 lb 1.1 oz) 97 kg (213 lb 14.4 oz)    Examination:  General exam: Appears calm and comfortable  Respiratory system: Clear to auscultation. Respiratory effort normal. Cardiovascular system: S1 & S2 heard, RRR. No JVD Gastrointestinal system: Abdomen is nondistended, soft and nontender. No organomegaly or masses felt. Normal bowel sounds heard. Central nervous system:  Alert and oriented. No focal neurological deficits. Extremities: Symmetric 5 x 5 power. Skin: No rashes, lesions or ulcers Psychiatry: Judgement and insight appear normal. Mood & affect appropriate.   Data Reviewed: I have personally reviewed following labs and imaging studies  CBC:  Recent Labs Lab 08/18/15 1949  WBC 5.0  NEUTROABS 2.9  HGB 13.6  HCT 38.2*  MCV 90.1  PLT [id number]   Basic Metabolic Panel:  Recent Labs Lab 08/18/15 1949  NA 140  K 3.3*  CL 106  CO2 26  GLUCOSE 97  BUN 11  CREATININE 0.75  CALCIUM 9.0  MG 2.0   GFR: Estimated Creatinine Clearance: 95.4 mL/min (by C-G formula based on SCr of 0.8 mg/dL). Liver Function Tests:  Recent Labs Lab  08/18/15 1949  AST 179*  ALT 209*  ALKPHOS 244*  BILITOT 15.3*  PROT 6.9  ALBUMIN 3.1*   No results for input(s): LIPASE, AMYLASE in the last 168 hours. No results for input(s): AMMONIA in the last 168 hours. Coagulation Profile:  Recent Labs Lab 08/18/15 1949  INR 1.17   Cardiac Enzymes: No results for input(s): CKTOTAL, CKMB, CKMBINDEX, TROPONINI in the last 168 hours. BNP (last 3 results) No results for input(s): PROBNP in the last 8760 hours. HbA1C: No results for input(s): HGBA1C in the last 72 hours. CBG: No results for input(s): GLUCAP in the last 168 hours. Lipid Profile: No results for input(s): CHOL, HDL, LDLCALC, TRIG, CHOLHDL, LDLDIRECT in the last 72 hours. Thyroid Function Tests: No results for input(s): TSH, T4TOTAL, FREET4, T3FREE, THYROIDAB in the last 72 hours. Anemia Panel: No results for input(s): VITAMINB12, FOLATE, FERRITIN, TIBC, IRON, RETICCTPCT in the last 72 hours. Urine analysis:    Component Value Date/Time   COLORURINE RED (A) 08/19/2015 0126   APPEARANCEUR CLOUDY (A) 08/19/2015 0126   LABSPEC 1.022 08/19/2015 0126   PHURINE 6.0 08/19/2015 0126   GLUCOSEU NEGATIVE 08/19/2015 0126   HGBUR NEGATIVE 08/19/2015 0126   BILIRUBINUR LARGE (A) 08/19/2015 0126   KETONESUR NEGATIVE 08/19/2015 0126   PROTEINUR 30 (A) 08/19/2015 0126   UROBILINOGEN 1.0 07/29/2014 1130   NITRITE POSITIVE (A) 08/19/2015 0126   LEUKOCYTESUR SMALL (A) 08/19/2015 0126   Sepsis Labs: @LABRCNTIP (procalcitonin:4,lacticidven:4)   )No results found for this or any previous visit (from the past 240 hour(s)).    Radiology Studies: Ct Abdomen Pelvis Wo Contrast Result Date: 08/18/2015 1. Marked dilatation of the common bile duct to 2.0 cm in diameter, with diffuse distention of the gallbladder. Underlying cholelithiasis noted. Diffuse intrahepatic biliary ductal dilatation seen. This raises concern for distal obstruction. Underlying mass cannot be excluded. Associated  dilatation of the pancreatic duct to 9 mm in diameter. Vague nonspecific fullness noted about the head of the pancreas. MRCP is recommended for further evaluation. 2. Scattered calcification along the abdominal aorta and its branches. 3. Mild coronary artery calcifications seen. 4. Scattered bilateral renal cysts noted. 5. Borderline enlarged prostate.      Scheduled Meds: . amLODipine  5 mg Oral Daily   And  . irbesartan  300 mg Oral Daily  . ciprofloxacin  500 mg Oral BID  . dutasteride  0.5 mg Oral Daily  . famotidine  20 mg Oral BID  . folic acid  [id number] mcg Oral Daily   Continuous Infusions:    LOS: 1 day    Time spent: 25 minutes  Greater than 50% of the time spent on counseling and coordinating the care.   Leisa Lenz, MD [facility] Pager [phone]  If 7PM-7AM, please contact night-coverage [email or web address] Password [facility] 08/19/2015, 10:32 AM

## 2015-08-19 NOTE — Care Management Obs Status (Signed)
[facility] NOTIFICATION   Patient Details  Name: Dustin Lucas MRN: [id number] Date of Birth: 1940-06-22   Medicare Observation Status Notification Given:  Yes    Lynnell Catalan, RN 08/19/2015, 2:46 PM

## 2015-08-20 ENCOUNTER — Encounter (HOSPITAL_COMMUNITY)
Admission: AD | Disposition: A | Discharge status: Home / Self Care | Payer: Self-pay | Source: Ambulatory Visit | Attending: Internal Medicine

## 2015-08-20 ENCOUNTER — Encounter (HOSPITAL_COMMUNITY): Payer: Self-pay | Admitting: Anesthesiology

## 2015-08-20 ENCOUNTER — Observation Stay (HOSPITAL_COMMUNITY): Payer: Medicare Other

## 2015-08-20 ENCOUNTER — Observation Stay (HOSPITAL_COMMUNITY): Payer: Medicare Other | Admitting: Anesthesiology

## 2015-08-20 DIAGNOSIS — K831 Obstruction of bile duct: Secondary | ICD-10-CM | POA: Diagnosis not present

## 2015-08-20 DIAGNOSIS — R7989 Other specified abnormal findings of blood chemistry: Secondary | ICD-10-CM | POA: Diagnosis not present

## 2015-08-20 DIAGNOSIS — I10 Essential (primary) hypertension: Secondary | ICD-10-CM | POA: Diagnosis not present

## 2015-08-20 DIAGNOSIS — R935 Abnormal findings on diagnostic imaging of other abdominal regions, including retroperitoneum: Secondary | ICD-10-CM | POA: Diagnosis not present

## 2015-08-20 DIAGNOSIS — R945 Abnormal results of liver function studies: Secondary | ICD-10-CM | POA: Diagnosis not present

## 2015-08-20 DIAGNOSIS — R17 Unspecified jaundice: Secondary | ICD-10-CM | POA: Diagnosis not present

## 2015-08-20 DIAGNOSIS — R634 Abnormal weight loss: Secondary | ICD-10-CM | POA: Diagnosis not present

## 2015-08-20 HISTORY — PX: ERCP: SHX5425

## 2015-08-20 LAB — BASIC METABOLIC PANEL
ANION GAP: 7 (ref 5–15)
BUN: 7 mg/dL (ref 6–20)
CHLORIDE: 108 mmol/L (ref 101–111)
CO2: 24 mmol/L (ref 22–32)
Calcium: 8.9 mg/dL (ref 8.9–10.3)
Creatinine, Ser: 0.5 mg/dL — ABNORMAL LOW (ref 0.61–1.24)
GFR calc Af Amer: >60 mL/min (ref >60)
GFR calc non Af Amer: >60 mL/min (ref >60)
GLUCOSE: 88 mg/dL (ref 65–99)
POTASSIUM: 4.4 mmol/L (ref 3.5–5.1)
Sodium: 139 mmol/L (ref 135–145)

## 2015-08-20 LAB — GLUCOSE, CAPILLARY
GLUCOSE-CAPILLARY: 76 mg/dL (ref 65–99)
Glucose-Capillary: 60 mg/dL — ABNORMAL LOW (ref 65–99)
Glucose-Capillary: 77 mg/dL (ref 65–99)
Glucose-Capillary: 103 mg/dL — ABNORMAL HIGH (ref 65–99)
Glucose-Capillary: 155 mg/dL — ABNORMAL HIGH (ref 65–99)

## 2015-08-20 LAB — SURGICAL PCR SCREEN
MRSA, PCR: NEGATIVE
Staphylococcus aureus: NEGATIVE

## 2015-08-20 SURGERY — ERCP, WITH INTERVENTION IF INDICATED
Anesthesia: General

## 2015-08-20 MED ORDER — LABETALOL HCL 5 MG/ML IV SOLN
5.0000 mg | Freq: Once | INTRAVENOUS | Status: AC
  Administered 2015-08-20: 5 mg via INTRAVENOUS
  Filled 2015-08-20: qty 4

## 2015-08-20 MED ORDER — LIDOCAINE HCL (CARDIAC) 20 MG/ML IV SOLN
INTRAVENOUS | Status: AC
  Filled 2015-08-20: qty 5

## 2015-08-20 MED ORDER — PHENYLEPHRINE HCL 10 MG/ML IJ SOLN
INTRAMUSCULAR | Status: DC | PRN
  Administered 2015-08-20 (×4): 80 ug via INTRAVENOUS

## 2015-08-20 MED ORDER — SUCCINYLCHOLINE CHLORIDE 20 MG/ML IJ SOLN
INTRAMUSCULAR | Status: DC | PRN
  Administered 2015-08-20: 100 mg via INTRAVENOUS

## 2015-08-20 MED ORDER — PROPOFOL 10 MG/ML IV BOLUS
INTRAVENOUS | Status: AC
  Filled 2015-08-20: qty 20

## 2015-08-20 MED ORDER — FENTANYL CITRATE (PF) 100 MCG/2ML IJ SOLN
INTRAMUSCULAR | Status: AC
  Filled 2015-08-20: qty 2

## 2015-08-20 MED ORDER — LIDOCAINE HCL (CARDIAC) 20 MG/ML IV SOLN
INTRAVENOUS | Status: DC | PRN
  Administered 2015-08-20: 50 mg via INTRAVENOUS

## 2015-08-20 MED ORDER — LACTATED RINGERS IV SOLN: INTRAVENOUS | Status: DC

## 2015-08-20 MED ORDER — GLUCAGON HCL RDNA (DIAGNOSTIC) 1 MG IJ SOLR
INTRAMUSCULAR | Status: DC | PRN
  Administered 2015-08-20 (×2): .5 mg via INTRAVENOUS

## 2015-08-20 MED ORDER — CIPROFLOXACIN IN D5W 400 MG/200ML IV SOLN
INTRAVENOUS | Status: AC
  Filled 2015-08-20: qty 200

## 2015-08-20 MED ORDER — PHENYLEPHRINE HCL 10 MG/ML IJ SOLN
INTRAVENOUS | Status: DC | PRN
  Administered 2015-08-20: 50 ug/min via INTRAVENOUS

## 2015-08-20 MED ORDER — OXYCODONE HCL 5 MG/5ML PO SOLN: 5.0000 mg | Freq: Once | ORAL | Status: DC | PRN

## 2015-08-20 MED ORDER — PHENOL 1.4 % MT LIQD
1.0000 | OROMUCOSAL | Status: DC | PRN
  Administered 2015-08-20: 1 via OROMUCOSAL
  Filled 2015-08-20: qty 177

## 2015-08-20 MED ORDER — ONDANSETRON HCL 4 MG/2ML IJ SOLN
INTRAMUSCULAR | Status: DC | PRN
  Administered 2015-08-20: 4 mg via INTRAVENOUS

## 2015-08-20 MED ORDER — ONDANSETRON HCL 4 MG/2ML IJ SOLN: 4.0000 mg | Freq: Four times a day (QID) | INTRAMUSCULAR | Status: DC | PRN

## 2015-08-20 MED ORDER — ONDANSETRON HCL 4 MG/2ML IJ SOLN
INTRAMUSCULAR | Status: AC
  Filled 2015-08-20: qty 2

## 2015-08-20 MED ORDER — PHENYLEPHRINE HCL 10 MG/ML IJ SOLN
INTRAMUSCULAR | Status: AC
  Filled 2015-08-20: qty 1

## 2015-08-20 MED ORDER — OXYCODONE HCL 5 MG PO TABS: 5.0000 mg | ORAL_TABLET | Freq: Once | ORAL | Status: DC | PRN

## 2015-08-20 MED ORDER — LACTATED RINGERS IV SOLN
INTRAVENOUS | Status: DC | PRN
  Administered 2015-08-20: 09:00:00 via INTRAVENOUS

## 2015-08-20 MED ORDER — FENTANYL CITRATE (PF) 100 MCG/2ML IJ SOLN: 25.0000 ug | INTRAMUSCULAR | Status: DC | PRN

## 2015-08-20 MED ORDER — FENTANYL CITRATE (PF) 100 MCG/2ML IJ SOLN
INTRAMUSCULAR | Status: DC | PRN
  Administered 2015-08-20: 100 ug via INTRAVENOUS
  Administered 2015-08-20 (×2): 50 ug via INTRAVENOUS

## 2015-08-20 MED ORDER — PHENYLEPHRINE 40 MCG/ML (10ML) SYRINGE FOR IV PUSH (FOR BLOOD PRESSURE SUPPORT)
PREFILLED_SYRINGE | INTRAVENOUS | Status: AC
  Filled 2015-08-20: qty 10

## 2015-08-20 MED ORDER — CIPROFLOXACIN IN D5W 400 MG/200ML IV SOLN
INTRAVENOUS | Status: DC | PRN
  Administered 2015-08-20: 400 mg via INTRAVENOUS

## 2015-08-20 MED ORDER — PROPOFOL 10 MG/ML IV BOLUS
INTRAVENOUS | Status: DC | PRN
  Administered 2015-08-20: 150 mg via INTRAVENOUS

## 2015-08-20 MED ORDER — SODIUM CHLORIDE 0.9 % IJ SOLN
INTRAMUSCULAR | Status: AC
  Filled 2015-08-20: qty 10

## 2015-08-20 NOTE — Progress Notes (Signed)
Patient seen and case discussed with Dr. Benson Norway patient and son and hospital computer chart reviewed and I am happy to retry ERCP tomorrow at 10 AM however patient wants to talk to his wife and decide if they want a second attempt or just proceed with PTC early next week and he will call me back after he talks to her and we discussed both those procedures as well but he is doing fine from his attempt earlier today

## 2015-08-20 NOTE — Anesthesia Procedure Notes (Addendum)
Procedure Name: Intubation Date/Time: 08/20/2015 9:19 AM Performed by: Anne Fu Pre-anesthesia Checklist: Patient identified, Emergency Drugs available, Suction available, Patient being monitored and Timeout performed Patient Re-evaluated:Patient Re-evaluated prior to inductionOxygen Delivery Method: Circle system utilized Preoxygenation: Pre-oxygenation with 100% oxygen Intubation Type: IV induction Ventilation: Mask ventilation without difficulty Laryngoscope Size: Mac and 4 Tube type: Oral Tube size: 7.5 mm Number of attempts: 1 Airway Equipment and Method: Stylet Placement Confirmation: ETT inserted through vocal cords under direct vision,  positive ETCO2,  CO2 detector and breath sounds checked- equal and bilateral Secured at: 23 cm Tube secured with: Tape Dental Injury: Teeth and Oropharynx as per pre-operative assessment

## 2015-08-20 NOTE — Transfer of Care (Signed)
Immediate Anesthesia Transfer of Care Note  Patient: Dustin Lucas  Procedure(s) Performed: Procedure(s): ENDOSCOPIC RETROGRADE CHOLANGIOPANCREATOGRAPHY (ERCP) (N/A)  Patient Location: PACU  Anesthesia Type:General  Level of Consciousness:  sedated, patient cooperative and responds to stimulation  Airway & Oxygen Therapy:Patient Spontanous Breathing and Patient connected to face mask oxgen  Post-op Assessment:  Report given to PACU RN and Post -op Vital signs reviewed and stable  Post vital signs:  Reviewed and stable  Last Vitals:  Vitals:   08/19/15 2200 08/20/15 0519  BP: (!) 145/85 (!) 164/83  Pulse: 62 64  Resp: 16 16  Temp: 37 C 37 C    Complications: No apparent anesthesia complications

## 2015-08-20 NOTE — Op Note (Signed)
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 Estimated Blood Loss:     Estimated blood loss was minimal. Procedure:                Pre-Anesthesia Assessment:                           - Prior to the procedure, a History and Physical                            was performed, and patient medications and                            allergies were reviewed. The patient's tolerance of                            previous anesthesia was also reviewed. The risks                            and benefits of the procedure and the sedation                            options and risks were discussed with the patient.                            All questions were answered, and informed consent                            was obtained. Prior Anticoagulants: The patient has                            taken no previous anticoagulant or antiplatelet                            agents. ASA Grade Assessment: II - A patient with                            mild systemic disease. After reviewing the risks                              and benefits, the patient was deemed in                            satisfactory condition to undergo the procedure.                           - Sedation was administered by an anesthesia                            professional. General anesthesia was attained.                           After obtaining informed consent, the scope was                            passed under direct  vision. Throughout the                            procedure, the patient's blood pressure, pulse, and                            oxygen saturations were monitored continuously. The                            [id number] ([id number]) scope was introduced through                            the mouth, and used to inject contrast into without                            successful cannulation. The ERCP was extremely                            difficult due to challenging cannulation. The                            patient tolerated the procedure well. Scope In: Scope Out: Findings:      A biliary pre-cut sphincterotomy measuring 15 mm in length was made with       a monofilament traction (standard) sphincterotome using ERBE       electrocautery. The sphincterotomy oozed blood.      The papilla was large and there was no spontaneous drainage of bile. The       sphincterotome was able to engage deeply into the ampulla, which allowed       a safe sphincterotomy. Impression:               - A biliary sphincterotomy was performed. Moderate Sedation:      N/A- Per Anesthesia Care Recommendation:           - Return patient to hospital ward for ongoing care.                           - Resume regular diet.                           -   Consult IR for PTC. Procedure Code(s):        --- Professional ---                           [phone], Esophagogastroduodenoscopy, flexible,                            transoral; diagnostic, including collection of                            specimen(s) by brushing or washing, when performed                            (separate procedure) Diagnosis Code(s):        --- Professional ---                           D49.0, Neoplasm of unspecified behavior of                            digestive system CPT copyright 2016 American Medical Association. All rights reserved. The codes documented in this report are preliminary and upon coder review may  be revised to meet current  compliance requirements. Carol Ada, MD Carol Ada, MD 08/20/2015 11:42:03 AM This report has been signed electronically. Number of Addenda: 0

## 2015-08-20 NOTE — Interval H&P Note (Signed)
History and Physical Interval Note:  08/20/2015 8:58 AM  Dustin Lucas  has presented today for surgery, with the diagnosis of CBD obstruction  The various methods of treatment have been discussed with the patient and family. After consideration of risks, benefits and other options for treatment, the patient has consented to  Procedure(s): ENDOSCOPIC RETROGRADE CHOLANGIOPANCREATOGRAPHY (ERCP) (N/A) as a surgical intervention .  The patient's history has been reviewed, patient examined, no change in status, stable for surgery.  I have reviewed the patient's chart and labs.  Questions were answered to the patient's satisfaction.     Ceri Mayer D

## 2015-08-20 NOTE — Anesthesia Postprocedure Evaluation (Signed)
Anesthesia Post Note  Patient: Dustin Lucas  Procedure(s) Performed: Procedure(s) (LRB): ENDOSCOPIC RETROGRADE CHOLANGIOPANCREATOGRAPHY (ERCP) (N/A)  Patient location during evaluation: PACU Anesthesia Type: General Level of consciousness: awake and alert and patient cooperative Pain management: pain level controlled Vital Signs Assessment: post-procedure vital signs reviewed and stable Respiratory status: spontaneous breathing and respiratory function stable Cardiovascular status: stable Anesthetic complications: no    Last Vitals:  Vitals:   08/20/15 1209 08/20/15 1220  BP: (!) 183/101 (!) 149/80  Pulse: 61 65  Resp: 12 12  Temp:  36.7 C    Last Pain:  Vitals:   08/20/15 0519  TempSrc: Oral  PainSc:                  [place] S

## 2015-08-20 NOTE — H&P (View-Only) (Signed)
Reason for Consult: Obstructive jaundice  Referring Physician: [facility]  Karren Cobble HPI: This is a 75 year old male with a PMH of a right hip replacement, left ankle fracture with plate, prostate cancer, and HTN admitted for elevated liver enzymes and jaundice.  He reports having dark urine for the past month and he was being evaluated by Dr. Vista Lawman.  Two weeks ago he noticed an overt jaundice in his sclera.  Over this interval time period he estimates a 10 lbs weight loss and anorexia.  Also, he feels that he has some worsening GERD issues.  Blood work from Dr. Matthias Hughs office revealed a TB of 13, AP 292, AST 228, and ALT 337.  An ultrasound was performed on 08/12/2015 and there was CBD ductal dilation at 1.6 cm.  Stones and sludge were noted in the gallbladder.  As a result of the findings he was directly admitted to the hospital.  A noncontrast CT scan of the abdomen essentially revealed a double duct sign.  He refused contrast as he has a shellfish allergy.  The CBD was noted to be at 2 cm and there is the possibility of a mass in the head of the pancreas.    Past Medical History:  Diagnosis Date  . Arthritis   . Cancer [facility])    prostate cancer  . Hypertension   . PONV (postoperative nausea and vomiting)   . Urinary frequency     Past Surgical History:  Procedure Laterality Date  . FRACTURE SURGERY Left    plate in ankle  . JOINT REPLACEMENT     hip replacement  right  . TONSILLECTOMY    . TOTAL HIP REVISION Right 08/09/2014   Procedure: TOTAL HIP REVISION;  Surgeon: Frederik Pear, MD;  Location: [place];  Service: Orthopedics;  Laterality: Right;  REVISION (ASR) RIGHT TOTAL HIP ARHROPLASTY  **INNOMET OSTEOTONES    History reviewed. No pertinent family history.  Social History:  reports that he has been smoking Cigarettes.  He has never used smokeless tobacco. He reports that he does not drink alcohol or use drugs.  Allergies:  Allergies  Allergen Reactions  .  Shellfish Allergy Swelling    Medications:  Scheduled: . amLODipine  5 mg Oral Daily   And  . irbesartan  300 mg Oral Daily  . dutasteride  0.5 mg Oral Daily  . famotidine  20 mg Oral BID  . folic acid  [id number] mcg Oral Daily   Continuous:   Results for orders placed or performed during the hospital encounter of 08/18/15 (from the past 24 hour(s))  Comprehensive metabolic panel     Status: Abnormal   Collection Time: 08/18/15  7:49 PM  Result Value Ref Range   Sodium 140 135 - 145 mmol/L   Potassium 3.3 (L) 3.5 - 5.1 mmol/L   Chloride 106 101 - 111 mmol/L   CO2 26 22 - 32 mmol/L   Glucose, Bld 97 65 - 99 mg/dL   BUN 11 6 - 20 mg/dL   Creatinine, Ser 0.75 0.61 - 1.24 mg/dL   Calcium 9.0 8.9 - 10.3 mg/dL   Total Protein 6.9 6.5 - 8.1 g/dL   Albumin 3.1 (L) 3.5 - 5.0 g/dL   AST 179 (H) 15 - 41 U/L   ALT 209 (H) 17 - 63 U/L   Alkaline Phosphatase 244 (H) 38 - 126 U/L   Total Bilirubin 15.3 (H) 0.3 - 1.2 mg/dL   GFR calc non Af Amer >60 >60 mL/min  GFR calc Af Amer >60 >60 mL/min   Anion gap 8 5 - 15  Magnesium     Status: None   Collection Time: 08/18/15  7:49 PM  Result Value Ref Range   Magnesium 2.0 1.7 - 2.4 mg/dL  CBC WITH DIFFERENTIAL     Status: Abnormal   Collection Time: 08/18/15  7:49 PM  Result Value Ref Range   WBC 5.0 4.0 - 10.5 K/uL   RBC 4.24 4.22 - 5.81 MIL/uL   Hemoglobin 13.6 13.0 - 17.0 g/dL   HCT 38.2 (L) 39.0 - 52.0 %   MCV 90.1 78.0 - 100.0 fL   MCH 32.1 26.0 - 34.0 pg   MCHC 35.6 30.0 - 36.0 g/dL   RDW 17.0 (H) 11.5 - 15.5 %   Platelets 172 150 - 400 K/uL   Neutrophils Relative % 58 %   Neutro Abs 2.9 1.7 - 7.7 K/uL   Lymphocytes Relative 30 %   Lymphs Abs 1.5 0.7 - 4.0 K/uL   Monocytes Relative 11 %   Monocytes Absolute 0.6 0.1 - 1.0 K/uL   Eosinophils Relative 1 %   Eosinophils Absolute 0.0 0.0 - 0.7 K/uL   Basophils Relative 0 %   Basophils Absolute 0.0 0.0 - 0.1 K/uL  Protime-INR     Status: None   Collection Time: 08/18/15  7:49 PM   Result Value Ref Range   Prothrombin Time 14.9 11.4 - 15.2 seconds   INR 1.17   APTT     Status: None   Collection Time: 08/18/15  7:49 PM  Result Value Ref Range   aPTT 33 24 - 36 seconds  Urinalysis, Routine w reflex microscopic (not at [facility])     Status: Abnormal   Collection Time: 08/19/15  1:26 AM  Result Value Ref Range   Color, Urine RED (A) YELLOW   APPearance CLOUDY (A) CLEAR   Specific Gravity, Urine 1.022 1.005 - 1.030   pH 6.0 5.0 - 8.0   Glucose, UA NEGATIVE NEGATIVE mg/dL   Hgb urine dipstick NEGATIVE NEGATIVE   Bilirubin Urine LARGE (A) NEGATIVE   Ketones, ur NEGATIVE NEGATIVE mg/dL   Protein, ur 30 (A) NEGATIVE mg/dL   Nitrite POSITIVE (A) NEGATIVE   Leukocytes, UA SMALL (A) NEGATIVE  Urine microscopic-add on     Status: Abnormal   Collection Time: 08/19/15  1:26 AM  Result Value Ref Range   Squamous Epithelial / LPF 0-5 (A) NONE SEEN   WBC, UA 0-5 0 - 5 WBC/hpf   RBC / HPF 0-5 0 - 5 RBC/hpf   Bacteria, UA FEW (A) NONE SEEN   Casts HYALINE CASTS (A) NEGATIVE     Ct Abdomen Pelvis Wo Contrast  Result Date: 08/18/2015 CLINICAL DATA:  Acute onset of abnormal LFTs.  Initial encounter. EXAM: CT ABDOMEN AND PELVIS WITHOUT CONTRAST TECHNIQUE: Multidetector CT imaging of the abdomen and pelvis was performed following the standard protocol without IV contrast. COMPARISON:  CT of the abdomen and pelvis from 12/05/2010 FINDINGS: The visualized lung bases are clear. Mild coronary artery calcifications are seen. There is marked dilatation of the common bile duct to 2.0 cm in diameter, with diffuse distention of the gallbladder. Small stones are seen within the gallbladder. Diffuse intrahepatic biliary ductal dilatation is seen. This raises concern for distal obstruction. Underlying mass cannot be excluded. There is also dilatation of the pancreatic duct to 9 mm in diameter. The spleen is unremarkable in appearance. Vague nonspecific fullness is noted about the head of  the  pancreas. The adrenal glands are grossly unremarkable. Scattered bilateral renal cysts are seen. The kidneys are otherwise grossly unremarkable. There is no evidence of hydronephrosis. No renal or ureteral stones are identified. No perinephric stranding is seen. No free fluid is identified. The small bowel is unremarkable in appearance. The stomach is within normal limits. No acute vascular abnormalities are seen. Scattered calcification is seen along the abdominal aorta and its branches. The appendix is normal in caliber and contains air, without evidence of appendicitis. The sigmoid colon is somewhat redundant. The colon is grossly unremarkable in appearance. The bladder is moderately distended, with scattered diverticula noted. The prostate is borderline enlarged, measuring 4.9 cm in transverse dimension. No inguinal lymphadenopathy is seen. No acute osseous abnormalities are identified. The patient's right hip arthroplasty is incompletely imaged but appears grossly unremarkable. There is mild grade 1 anterolisthesis of L4 on L5. Vacuum phenomenon is noted at L5-S1. IMPRESSION: 1. Marked dilatation of the common bile duct to 2.0 cm in diameter, with diffuse distention of the gallbladder. Underlying cholelithiasis noted. Diffuse intrahepatic biliary ductal dilatation seen. This raises concern for distal obstruction. Underlying mass cannot be excluded. Associated dilatation of the pancreatic duct to 9 mm in diameter. Vague nonspecific fullness noted about the head of the pancreas. MRCP is recommended for further evaluation. 2. Scattered calcification along the abdominal aorta and its branches. 3. Mild coronary artery calcifications seen. 4. Scattered bilateral renal cysts noted. 5. Borderline enlarged prostate. Electronically Signed   By: Garald Balding M.D.   On: 08/18/2015 21:50    ROS:  As stated above in the HPI otherwise negative.  Blood pressure (!) 147/80, pulse 71, temperature 98 F (36.7 C),  temperature source Oral, resp. rate 16, height 6\' 3"  (1.905 m), weight 97 kg (213 lb 14.4 oz), SpO2 100 %.    PE: Gen: NAD, Alert and Oriented HEENT:  [place]/AT, EOMI Neck: Supple, no LAD Lungs: CTA Bilaterally CV: RRR without M/G/R ABM: Soft, NTND, +BS Ext: No C/C/E  Assessment/Plan: 1) Obstructive jaundice. 2) Abnormal liver enzymes. 3) Cholelithiasis.   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  An MRCP was recommended for further elucidation of the situation and he is agreeable to the scan.  More importantly, an ERCP with stent placement will be required.  I am very certain that this is a malignant source and I will place a covered metallic stent.  Plan: 1) MRCP. 2) ERCP with stent placement tomorrow. 3) Ciprofloxacin to prophylax against ascending cholangitis.  Nohea Kras D 08/19/2015, 8:23 AM

## 2015-08-20 NOTE — Anesthesia Preprocedure Evaluation (Signed)
Anesthesia Evaluation  Patient identified by MRN, date of birth, ID band Patient awake    Reviewed: Allergy & Precautions, NPO status , Patient's Chart, lab work & pertinent test results  History of Anesthesia Complications (+) PONV and history of anesthetic complications  Airway Mallampati: II   Neck ROM: full    Dental   Pulmonary Current Smoker,    breath sounds clear to auscultation       Cardiovascular hypertension,  Rhythm:regular Rate:Normal     Neuro/Psych    GI/Hepatic   Endo/Other    Renal/GU      Musculoskeletal  (+) Arthritis ,   Abdominal   Peds  Hematology   Anesthesia Other Findings   Reproductive/Obstetrics                             Anesthesia Physical Anesthesia Plan  ASA: II  Anesthesia Plan: General   Post-op Pain Management:    Induction: Intravenous  Airway Management Planned: Oral ETT  Additional Equipment:   Intra-op Plan:   Post-operative Plan: Extubation in OR  Informed Consent: I have reviewed the patients History and Physical, chart, labs and discussed the procedure including the risks, benefits and alternatives for the proposed anesthesia with the patient or authorized representative who has indicated his/her understanding and acceptance.     Plan Discussed with: CRNA, Anesthesiologist and Surgeon  Anesthesia Plan Comments:         Anesthesia Quick Evaluation

## 2015-08-20 NOTE — Progress Notes (Signed)
CRITICAL VALUE ALERT  Critical value received:  CBG 60  Date of notification:  08/20/15  Time of notification:  1440  Critical value read back:Yes.    Nurse who received alert:  Leona Carry  MD notified (1st page):  Leisa Lenz  Time of first page:  1440

## 2015-08-20 NOTE — Progress Notes (Addendum)
Patient ID: Dustin Lucas, male   DOB: 11-11-1940, 75 y.o.   MRN: [id number]  PROGRESS NOTE    Dustin Lucas  [id number] DOB: 1940/09/12 DOA: 08/18/2015  PCP: Benito Mccreedy, MD   Brief Narrative:  75 year old male with past medical history of hip replacement, left ankle fracture, prostate cancer, hypertension who presented to [facility] with jaundice, dark urine for past month. Patient also reported about 10 pound weight loss and poor by mouth intake for the same amount of time. He went to see primary care physician and workup in their office revealed AST 228, ALT 337, ALP 292 and total bili of 13. Ultrasound performed 08/12/2015 showed CBD ductal dilatation at 1.6 cm with stones and sludge noted in gallbladder. He was directly admitted to the hospital. Noncontrast CT scan of the abdomen showed dilatation of the common bile duct up to 2 cm in diameter and diffuse distention of the gallbladder with underlying cholelithiasis, raising concern for distal obstruction. Mass cannot be excluded. There is associated dilatation of the pancreatic duct up to 9 mm in diameter. Patient was seen by GI in consultation and plan is for ERCP.   Assessment & Plan:   Active Problems: Obstructive jaundice / abnormal LFTs / cholelithiasis - Abdominal ultrasound performed 08/12/2015 showed CBD ductal dilatation at 1.6 cm with stones and sludge noted in gallbladder - CT scan of the abdomen showed dilatation of the common bile duct up to 2 cm in diameter and diffuse distention of the gallbladder with underlying cholelithiasis, raising concern for distal obstruction. Mass cannot be excluded. There is associated dilatation of the pancreatic duct up to 9 mm in diameter.  - Patient was seen by GI in consultation and plan is for ERCP today  - Continue empiric ciprofloxacin 500 mg twice daily - Continue supportive care with fluids  Essential hypertension - Continue Norvasc and of Avapro   DVT prophylaxis:  SCDs bilaterally Code Status: full code  Family Communication: No family at the bedside Disposition Plan: Home once cleared by GI   Consultants:   GI, Dr. Carol Ada   Procedures:   Plan for ERCP 8/19  Antimicrobials:   Cipro 08/18/2015 -->   Subjective: No overnight events.   Objective: Vitals:   08/20/15 1152 08/20/15 1200 08/20/15 1205 08/20/15 1206  BP: (!) 201/96 (!) 197/105  (!) 196/107  Pulse: 61 62 63 60  Resp:   15 13  Temp:   97.4 F (36.3 C)   TempSrc:      SpO2: 100% 100% 100% 100%  Weight:      Height:        Intake/Output Summary (Last 24 hours) at 08/20/15 1210 Last data filed at 08/20/15 1200  Gross per 24 hour  Intake             1380 ml  Output              500 ml  Net              880 ml   Filed Weights   08/18/15 2015 08/19/15 0500 08/20/15 0519  Weight: 97.1 kg (214 lb 1.1 oz) 97 kg (213 lb 14.4 oz) 96.6 kg (212 lb 15.4 oz)    Examination:  General exam: No distress  Respiratory system: Respiratory effort normal. Cardiovascular system: S1 & S2 heard, Rate controlled  Gastrointestinal system: (+) BS, non tender  Central nervous system: No focal neurological deficits. Extremities: No edema, palpables pulses  Skin: No rashes, lesions  or ulcers Psychiatry: Mood & affect appropriate.   Data Reviewed: I have personally reviewed following labs and imaging studies  CBC:  Recent Labs Lab 08/18/15 1949  WBC 5.0  NEUTROABS 2.9  HGB 13.6  HCT 38.2*  MCV 90.1  PLT [id number]   Basic Metabolic Panel:  Recent Labs Lab 08/18/15 1949 08/20/15 0401  NA 140 139  K 3.3* 4.4  CL 106 108  CO2 26 24  GLUCOSE 97 88  BUN 11 7  CREATININE 0.75 0.50*  CALCIUM 9.0 8.9  MG 2.0  --    GFR: Estimated Creatinine Clearance: 95.4 mL/min (by C-G formula based on SCr of 0.8 mg/dL). Liver Function Tests:  Recent Labs Lab 08/18/15 1949  AST 179*  ALT 209*  ALKPHOS 244*  BILITOT 15.3*  PROT 6.9  ALBUMIN 3.1*   No results for input(s):  LIPASE, AMYLASE in the last 168 hours. No results for input(s): AMMONIA in the last 168 hours. Coagulation Profile:  Recent Labs Lab 08/18/15 1949  INR 1.17   Cardiac Enzymes: No results for input(s): CKTOTAL, CKMB, CKMBINDEX, TROPONINI in the last 168 hours. BNP (last 3 results) No results for input(s): PROBNP in the last 8760 hours. HbA1C: No results for input(s): HGBA1C in the last 72 hours. CBG:  Recent Labs Lab 08/19/15 2149 08/20/15 0724  GLUCAP 89 76   Lipid Profile: No results for input(s): CHOL, HDL, LDLCALC, TRIG, CHOLHDL, LDLDIRECT in the last 72 hours. Thyroid Function Tests: No results for input(s): TSH, T4TOTAL, FREET4, T3FREE, THYROIDAB in the last 72 hours. Anemia Panel: No results for input(s): VITAMINB12, FOLATE, FERRITIN, TIBC, IRON, RETICCTPCT in the last 72 hours. Urine analysis:    Component Value Date/Time   COLORURINE RED (A) 08/19/2015 0126   APPEARANCEUR CLOUDY (A) 08/19/2015 0126   LABSPEC 1.022 08/19/2015 0126   PHURINE 6.0 08/19/2015 0126   GLUCOSEU NEGATIVE 08/19/2015 0126   HGBUR NEGATIVE 08/19/2015 0126   BILIRUBINUR LARGE (A) 08/19/2015 0126   KETONESUR NEGATIVE 08/19/2015 0126   PROTEINUR 30 (A) 08/19/2015 0126   UROBILINOGEN 1.0 07/29/2014 1130   NITRITE POSITIVE (A) 08/19/2015 0126   LEUKOCYTESUR SMALL (A) 08/19/2015 0126   Sepsis Labs: @LABRCNTIP (procalcitonin:4,lacticidven:4)   ) Recent Results (from the past 240 hour(s))  Surgical PCR screen     Status: None   Collection Time: 08/20/15  4:16 AM  Result Value Ref Range Status   MRSA, PCR NEGATIVE NEGATIVE Final   Staphylococcus aureus NEGATIVE NEGATIVE Final    Comment:        The Xpert SA Assay (FDA approved for NASAL specimens in patients over 85 years of age), is one component of a comprehensive surveillance program.  Test performance has been validated by [facility] for patients greater than or equal to 69 year old. It is not intended to diagnose infection  nor to guide or monitor treatment.       Radiology Studies: Ct Abdomen Pelvis Wo Contrast Result Date: 08/18/2015 1. Marked dilatation of the common bile duct to 2.0 cm in diameter, with diffuse distention of the gallbladder. Underlying cholelithiasis noted. Diffuse intrahepatic biliary ductal dilatation seen. This raises concern for distal obstruction. Underlying mass cannot be excluded. Associated dilatation of the pancreatic duct to 9 mm in diameter. Vague nonspecific fullness noted about the head of the pancreas. MRCP is recommended for further evaluation. 2. Scattered calcification along the abdominal aorta and its branches. 3. Mild coronary artery calcifications seen. 4. Scattered bilateral renal cysts noted. 5. Borderline enlarged prostate.  Scheduled Meds: . [MAR Hold] amLODipine  5 mg Oral Daily   And  . [MAR Hold] irbesartan  300 mg Oral Daily  . [MAR Hold] ciprofloxacin  500 mg Oral BID  . [MAR Hold] dutasteride  0.5 mg Oral Daily  . [MAR Hold] famotidine  20 mg Oral BID  . [MAR Hold] folic acid  [id number] mcg Oral Daily   Continuous Infusions: . lactated ringers       LOS: 1 day    Time spent:15 minutes  Greater than 50% of the time spent on counseling and coordinating the care.   Leisa Lenz, MD [facility] Pager [phone]  If 7PM-7AM, please contact night-coverage [email or web address] Password [email or web address] 08/20/2015, 12:10 PM

## 2015-08-21 ENCOUNTER — Observation Stay (HOSPITAL_COMMUNITY): Payer: Medicare Other | Admitting: Anesthesiology

## 2015-08-21 ENCOUNTER — Encounter (HOSPITAL_COMMUNITY)
Admission: AD | Disposition: A | Discharge status: Home / Self Care | Payer: Self-pay | Source: Ambulatory Visit | Attending: Internal Medicine

## 2015-08-21 ENCOUNTER — Encounter (HOSPITAL_COMMUNITY): Payer: Self-pay | Admitting: Anesthesiology

## 2015-08-21 ENCOUNTER — Observation Stay (HOSPITAL_COMMUNITY): Payer: Medicare Other

## 2015-08-21 DIAGNOSIS — E876 Hypokalemia: Secondary | ICD-10-CM | POA: Diagnosis present

## 2015-08-21 DIAGNOSIS — R7989 Other specified abnormal findings of blood chemistry: Secondary | ICD-10-CM | POA: Diagnosis not present

## 2015-08-21 DIAGNOSIS — K8050 Calculus of bile duct without cholangitis or cholecystitis without obstruction: Secondary | ICD-10-CM

## 2015-08-21 DIAGNOSIS — K8020 Calculus of gallbladder without cholecystitis without obstruction: Secondary | ICD-10-CM | POA: Diagnosis not present

## 2015-08-21 DIAGNOSIS — K831 Obstruction of bile duct: Secondary | ICD-10-CM | POA: Diagnosis not present

## 2015-08-21 DIAGNOSIS — I10 Essential (primary) hypertension: Secondary | ICD-10-CM | POA: Diagnosis present

## 2015-08-21 DIAGNOSIS — R17 Unspecified jaundice: Secondary | ICD-10-CM | POA: Diagnosis not present

## 2015-08-21 DIAGNOSIS — R945 Abnormal results of liver function studies: Secondary | ICD-10-CM | POA: Diagnosis not present

## 2015-08-21 DIAGNOSIS — R935 Abnormal findings on diagnostic imaging of other abdominal regions, including retroperitoneum: Secondary | ICD-10-CM | POA: Diagnosis not present

## 2015-08-21 DIAGNOSIS — R634 Abnormal weight loss: Secondary | ICD-10-CM | POA: Diagnosis not present

## 2015-08-21 HISTORY — PX: ERCP: SHX5425

## 2015-08-21 LAB — CBC WITH DIFFERENTIAL/PLATELET
Basophils Absolute: 0 10*3/uL (ref 0.0–0.1)
Basophils Relative: 0 %
Eosinophils Absolute: 0 10*3/uL (ref 0.0–0.7)
Eosinophils Relative: 0 %
HCT: 36.8 % — ABNORMAL LOW (ref 39.0–52.0)
HEMOGLOBIN: 12.8 g/dL — AB (ref 13.0–17.0)
LYMPHS ABS: 1.4 10*3/uL (ref 0.7–4.0)
LYMPHS PCT: 28 %
MCH: 31.8 pg (ref 26.0–34.0)
MCHC: 34.8 g/dL (ref 30.0–36.0)
MCV: 91.3 fL (ref 78.0–100.0)
MONOS PCT: 10 %
Monocytes Absolute: 0.5 10*3/uL (ref 0.1–1.0)
NEUTROS PCT: 62 %
Neutro Abs: 3.1 10*3/uL (ref 1.7–7.7)
Platelets: 155 10*3/uL (ref 150–400)
RBC: 4.03 MIL/uL — AB (ref 4.22–5.81)
RDW: 17.5 % — ABNORMAL HIGH (ref 11.5–15.5)
WBC: 5 10*3/uL (ref 4.0–10.5)

## 2015-08-21 LAB — COMPREHENSIVE METABOLIC PANEL
ALT: 149 U/L — AB (ref 17–63)
AST: 147 U/L — AB (ref 15–41)
Albumin: 2.5 g/dL — ABNORMAL LOW (ref 3.5–5.0)
Alkaline Phosphatase: 190 U/L — ABNORMAL HIGH (ref 38–126)
Anion gap: 7 (ref 5–15)
BUN: 10 mg/dL (ref 6–20)
CHLORIDE: 110 mmol/L (ref 101–111)
CO2: 23 mmol/L (ref 22–32)
Calcium: 8.7 mg/dL — ABNORMAL LOW (ref 8.9–10.3)
Creatinine, Ser: 0.65 mg/dL (ref 0.61–1.24)
Glucose, Bld: 116 mg/dL — ABNORMAL HIGH (ref 65–99)
POTASSIUM: 3.4 mmol/L — AB (ref 3.5–5.1)
SODIUM: 140 mmol/L (ref 135–145)
Total Bilirubin: 13.5 mg/dL — ABNORMAL HIGH (ref 0.3–1.2)
Total Protein: 5.7 g/dL — ABNORMAL LOW (ref 6.5–8.1)

## 2015-08-21 LAB — GLUCOSE, CAPILLARY: Glucose-Capillary: 113 mg/dL — ABNORMAL HIGH (ref 65–99)

## 2015-08-21 SURGERY — ERCP, WITH INTERVENTION IF INDICATED
Anesthesia: General

## 2015-08-21 MED ORDER — CIPROFLOXACIN IN D5W 400 MG/200ML IV SOLN
INTRAVENOUS | Status: AC
  Filled 2015-08-21: qty 200

## 2015-08-21 MED ORDER — ONDANSETRON HCL 4 MG/2ML IJ SOLN
INTRAMUSCULAR | Status: DC | PRN
  Administered 2015-08-21: 4 mg via INTRAVENOUS

## 2015-08-21 MED ORDER — FENTANYL CITRATE (PF) 100 MCG/2ML IJ SOLN
25.0000 ug | INTRAMUSCULAR | Status: DC | PRN
  Administered 2015-08-21: 25 ug via INTRAVENOUS

## 2015-08-21 MED ORDER — FENTANYL CITRATE (PF) 100 MCG/2ML IJ SOLN
INTRAMUSCULAR | Status: AC
  Filled 2015-08-21: qty 2

## 2015-08-21 MED ORDER — PROMETHAZINE HCL 25 MG/ML IJ SOLN: 6.2500 mg | INTRAMUSCULAR | Status: DC | PRN

## 2015-08-21 MED ORDER — CIPROFLOXACIN IN D5W 400 MG/200ML IV SOLN
INTRAVENOUS | Status: DC | PRN
  Administered 2015-08-21: 400 mg via INTRAVENOUS

## 2015-08-21 MED ORDER — GLUCAGON HCL RDNA (DIAGNOSTIC) 1 MG IJ SOLR
INTRAMUSCULAR | Status: AC
  Filled 2015-08-21: qty 1

## 2015-08-21 MED ORDER — LIDOCAINE HCL (CARDIAC) 20 MG/ML IV SOLN
INTRAVENOUS | Status: DC | PRN
  Administered 2015-08-21: 50 mg via INTRAVENOUS

## 2015-08-21 MED ORDER — SODIUM CHLORIDE 0.9 % IV SOLN
INTRAVENOUS | Status: DC | PRN
  Administered 2015-08-21: 12:00:00

## 2015-08-21 MED ORDER — PROPOFOL 10 MG/ML IV BOLUS
INTRAVENOUS | Status: AC
  Filled 2015-08-21: qty 20

## 2015-08-21 MED ORDER — PHENYLEPHRINE HCL 10 MG/ML IJ SOLN
INTRAMUSCULAR | Status: DC | PRN
  Administered 2015-08-21: 100 ug via INTRAVENOUS
  Administered 2015-08-21: 40 ug via INTRAVENOUS
  Administered 2015-08-21: 60 ug via INTRAVENOUS

## 2015-08-21 MED ORDER — PROPOFOL 10 MG/ML IV BOLUS
INTRAVENOUS | Status: DC | PRN
  Administered 2015-08-21: 180 mg via INTRAVENOUS

## 2015-08-21 MED ORDER — PHENYLEPHRINE HCL 10 MG/ML IJ SOLN
INTRAVENOUS | Status: DC | PRN
  Administered 2015-08-21: 50 ug/min via INTRAVENOUS

## 2015-08-21 MED ORDER — LACTATED RINGERS IV SOLN
INTRAVENOUS | Status: DC
  Administered 2015-08-21: 14:00:00 via INTRAVENOUS

## 2015-08-21 MED ORDER — LACTATED RINGERS IV SOLN
INTRAVENOUS | Status: DC | PRN
  Administered 2015-08-21: 12:00:00 via INTRAVENOUS

## 2015-08-21 MED ORDER — SUCCINYLCHOLINE CHLORIDE 20 MG/ML IJ SOLN
INTRAMUSCULAR | Status: DC | PRN
  Administered 2015-08-21: 100 mg via INTRAVENOUS

## 2015-08-21 MED ORDER — FENTANYL CITRATE (PF) 100 MCG/2ML IJ SOLN
INTRAMUSCULAR | Status: DC | PRN
  Administered 2015-08-21 (×2): 25 ug via INTRAVENOUS
  Administered 2015-08-21: 50 ug via INTRAVENOUS
  Administered 2015-08-21: 25 ug via INTRAVENOUS

## 2015-08-21 MED ORDER — POTASSIUM CHLORIDE 10 MEQ/100ML IV SOLN
10.0000 meq | INTRAVENOUS | Status: AC
  Administered 2015-08-21 (×3): 10 meq via INTRAVENOUS
  Filled 2015-08-21 (×3): qty 100

## 2015-08-21 NOTE — Anesthesia Procedure Notes (Addendum)
Procedure Name: Intubation Date/Time: 08/21/2015 11:47 AM Performed by: Anne Fu Pre-anesthesia Checklist: Patient identified, Emergency Drugs available, Suction available, Patient being monitored and Timeout performed Patient Re-evaluated:Patient Re-evaluated prior to inductionOxygen Delivery Method: Circle system utilized Preoxygenation: Pre-oxygenation with 100% oxygen Intubation Type: IV induction Ventilation: Mask ventilation without difficulty Laryngoscope Size: Mac and 4 Grade View: Grade I Tube type: Oral Tube size: 7.5 mm Number of attempts: 1 Airway Equipment and Method: Stylet Placement Confirmation: ETT inserted through vocal cords under direct vision,  positive ETCO2,  CO2 detector and breath sounds checked- equal and bilateral Secured at: 25 cm Tube secured with: Tape Dental Injury: Teeth and Oropharynx as per pre-operative assessment

## 2015-08-21 NOTE — Transfer of Care (Signed)
Immediate Anesthesia Transfer of Care Note  Patient: Dustin Lucas  Procedure(s) Performed: Procedure(s): ENDOSCOPIC RETROGRADE CHOLANGIOPANCREATOGRAPHY (ERCP) (N/A)  Patient Location: PACU  Anesthesia Type:General  Level of Consciousness:  sedated, patient cooperative and responds to stimulation  Airway & Oxygen Therapy:Patient Spontanous Breathing and Patient connected to face mask oxgen  Post-op Assessment:  Report given to PACU RN and Post -op Vital signs reviewed and stable  Post vital signs:  Reviewed and stable  Last Vitals:  Vitals:   08/20/15 2155 08/21/15 0500  BP: 136/67 (!) 159/86  Pulse: 71 70  Resp: 15 14  Temp: 36.9 C [id number] C    Complications: No apparent anesthesia complications

## 2015-08-21 NOTE — Progress Notes (Addendum)
Patient ID: Dustin Lucas, male   DOB: January 23, 1940, 75 y.o.   MRN: [id number]  PROGRESS NOTE    Dustin Lucas  [id number] DOB: 1940-12-21 DOA: 08/18/2015  PCP: Benito Mccreedy, MD   Brief Narrative:  75 year old male with past medical history of hip replacement, left ankle fracture, prostate cancer, hypertension who presented to [facility] hospital with jaundice, dark urine for past month. Patient also reported about 10 pound weight loss and poor by mouth intake for the same amount of time. He went to see primary care physician and workup in their office revealed AST 228, ALT 337, ALP 292 and total bili of 13. Ultrasound performed 08/12/2015 showed CBD ductal dilatation at 1.6 cm with stones and sludge noted in gallbladder. He was directly admitted to the hospital. Noncontrast CT scan of the abdomen showed dilatation of the common bile duct up to 2 cm in diameter and diffuse distention of the gallbladder with underlying cholelithiasis, raising concern for distal obstruction. Mass cannot be excluded. There is associated dilatation of the pancreatic duct up to 9 mm in diameter. Patient was seen by GI in consultation and plan is for ERCP.   Assessment & Plan:   Active Problems: Obstructive jaundice / abnormal LFTs / cholelithiasis - Abdominal ultrasound performed 08/12/2015 showed CBD ductal dilatation at 1.6 cm with stones and sludge noted in gallbladder - CT scan of the abdomen showed dilatation of the common bile duct up to 2 cm in diameter and diffuse distention of the gallbladder with underlying cholelithiasis, raising concern for distal obstruction. Mass cannot be excluded. There is associated dilatation of the pancreatic duct up to 9 mm in diameter.  - Patient was seen by GI in consultation - ERCP done 8/19 with biliary sphincterotomy but unable to place stent so repeat ERCP today by Dr. Watt Climes in an attempt to place stent  - LFT's slightly better this am  - Continue empiric ciprofloxacin  500 mg twice daily  Essential hypertension - Continue Norvasc and of Avapro  Hypokalemia - Due to GI losses - Supplemented - Check CMP in am   DVT prophylaxis: SCDs bilaterally Code Status: full code  Family Communication: No family at the bedside Disposition Plan: Home once cleared by GI   Consultants:   GI, Dr. Carol Ada and Dr. Clarene Essex   Procedures:   ERCP 8/19 -  Biliary sphincterotomy performed, stent not place  Repeat ERCP 8/20  Antimicrobials:   Cipro 08/18/2015 -->   Subjective: No overnight events.   Objective: Vitals:   08/20/15 1220 08/20/15 1253 08/20/15 2155 08/21/15 0500  BP: (!) 149/80 (!) 155/80 136/67 (!) 159/86  Pulse: 65  71 70  Resp: 12  15 14   Temp: 98.1 F (36.7 C)  98.4 F (36.9 C) 98.2 F (36.8 C)  TempSrc:   Oral Oral  SpO2: 100%  100% 100%  Weight:    97.6 kg (215 lb 2.7 oz)  Height:        Intake/Output Summary (Last 24 hours) at 08/21/15 1035 Last data filed at 08/21/15 0700  Gross per 24 hour  Intake             1020 ml  Output                0 ml  Net             1020 ml   Filed Weights   08/19/15 0500 08/20/15 0519 08/21/15 0500  Weight: 97 kg (213 lb 14.4 oz)  96.6 kg (212 lb 15.4 oz) 97.6 kg (215 lb 2.7 oz)    Examination:  General exam: No distress, pt calm, comfortable  Respiratory system: Respiratory effort normal. No wheezing  Cardiovascular system: S1 & S2 heard, RRR Gastrointestinal system: (+) BS, non distended  Central nervous system: Nonfocal  Extremities: No edema, no tenderness  Skin: Skin is warm and dry  Psychiatry: Normal mood   Data Reviewed: I have personally reviewed following labs and imaging studies  CBC:  Recent Labs Lab 08/18/15 1949 08/21/15 0435  WBC 5.0 5.0  NEUTROABS 2.9 3.1  HGB 13.6 12.8*  HCT 38.2* 36.8*  MCV 90.1 91.3  PLT 172 [id number]   Basic Metabolic Panel:  Recent Labs Lab 08/18/15 1949 08/20/15 0401 08/21/15 0435  NA 140 139 140  K 3.3* 4.4 3.4*  CL 106  108 110  CO2 26 24 23   GLUCOSE 97 88 116*  BUN 11 7 10   CREATININE 0.75 0.50* 0.65  CALCIUM 9.0 8.9 8.7*  MG 2.0  --   --    GFR: Estimated Creatinine Clearance: 95.4 mL/min (by C-G formula based on SCr of 0.8 mg/dL). Liver Function Tests:  Recent Labs Lab 08/18/15 1949 08/21/15 0435  AST 179* 147*  ALT 209* 149*  ALKPHOS 244* 190*  BILITOT 15.3* 13.5*  PROT 6.9 5.7*  ALBUMIN 3.1* 2.5*   No results for input(s): LIPASE, AMYLASE in the last 168 hours. No results for input(s): AMMONIA in the last 168 hours. Coagulation Profile:  Recent Labs Lab 08/18/15 1949  INR 1.17   Cardiac Enzymes: No results for input(s): CKTOTAL, CKMB, CKMBINDEX, TROPONINI in the last 168 hours. BNP (last 3 results) No results for input(s): PROBNP in the last 8760 hours. HbA1C: No results for input(s): HGBA1C in the last 72 hours. CBG:  Recent Labs Lab 08/20/15 1321 08/20/15 1405 08/20/15 1715 08/20/15 2158 08/21/15 0936  GLUCAP 60* 77 103* 155* 113*   Lipid Profile: No results for input(s): CHOL, HDL, LDLCALC, TRIG, CHOLHDL, LDLDIRECT in the last 72 hours. Thyroid Function Tests: No results for input(s): TSH, T4TOTAL, FREET4, T3FREE, THYROIDAB in the last 72 hours. Anemia Panel: No results for input(s): VITAMINB12, FOLATE, FERRITIN, TIBC, IRON, RETICCTPCT in the last 72 hours. Urine analysis:    Component Value Date/Time   COLORURINE RED (A) 08/19/2015 0126   APPEARANCEUR CLOUDY (A) 08/19/2015 0126   LABSPEC 1.022 08/19/2015 0126   PHURINE 6.0 08/19/2015 0126   GLUCOSEU NEGATIVE 08/19/2015 0126   HGBUR NEGATIVE 08/19/2015 0126   BILIRUBINUR LARGE (A) 08/19/2015 0126   KETONESUR NEGATIVE 08/19/2015 0126   PROTEINUR 30 (A) 08/19/2015 0126   UROBILINOGEN 1.0 07/29/2014 1130   NITRITE POSITIVE (A) 08/19/2015 0126   LEUKOCYTESUR SMALL (A) 08/19/2015 0126   Sepsis Labs: @LABRCNTIP (procalcitonin:4,lacticidven:4)   Recent Results (from the past 240 hour(s))  Surgical PCR  screen     Status: None   Collection Time: 08/20/15  4:16 AM  Result Value Ref Range Status   MRSA, PCR NEGATIVE NEGATIVE Final   Staphylococcus aureus NEGATIVE NEGATIVE Final    Comment:        The Xpert SA Assay (FDA approved for NASAL specimens in patients over 79 years of age), is one component of a comprehensive surveillance program.  Test performance has been validated by [facility] for patients greater than or equal to 88 year old. It is not intended to diagnose infection nor to guide or monitor treatment.       Radiology Studies: Ct Abdomen Pelvis  Wo Contrast Result Date: 08/18/2015 1. Marked dilatation of the common bile duct to 2.0 cm in diameter, with diffuse distention of the gallbladder. Underlying cholelithiasis noted. Diffuse intrahepatic biliary ductal dilatation seen. This raises concern for distal obstruction. Underlying mass cannot be excluded. Associated dilatation of the pancreatic duct to 9 mm in diameter. Vague nonspecific fullness noted about the head of the pancreas. MRCP is recommended for further evaluation. 2. Scattered calcification along the abdominal aorta and its branches. 3. Mild coronary artery calcifications seen. 4. Scattered bilateral renal cysts noted. 5. Borderline enlarged prostate.      Scheduled Meds: . [MAR Hold] amLODipine  5 mg Oral Daily   And  . [MAR Hold] irbesartan  300 mg Oral Daily  . [MAR Hold] ciprofloxacin  500 mg Oral BID  . [MAR Hold] dutasteride  0.5 mg Oral Daily  . [MAR Hold] famotidine  20 mg Oral BID  . [MAR Hold] folic acid  [id number] mcg Oral Daily   Continuous Infusions:     LOS: 1 day    Time spent:15 minutes  Greater than 50% of the time spent on counseling and coordinating the care.   Leisa Lenz, MD [facility] Pager [phone]  If 7PM-7AM, please contact night-coverage [email or web address] Password [email or web address] 08/21/2015, 10:35 AM

## 2015-08-21 NOTE — Op Note (Signed)
[facility] Patient Name: Dustin Lucas Procedure Date: 08/21/2015 MRN: [id number] Attending MD: Clarene Essex , MD Date of Birth: 12/17/1940 CSN: [id number] Age: 75 Admit Type: Inpatient Procedure:                ERCP Indications:              Biliary dilation on Computed Tomogram Scan                            obstructive jaundice elevated liver tests Providers:                Clarene Essex, MD, Hilma Favors, RN, Ralene Bathe,                            Technician Referring MD:              Medicines:                General Anesthesia Complications:            No immediate complications. Estimated Blood Loss:     Estimated blood loss: none. Procedure:                Pre-Anesthesia Assessment:                           - Prior to the procedure, a History and Physical                            was performed, and patient medications and                            allergies were reviewed. The patient's tolerance of                            previous anesthesia was also reviewed. The risks                            and benefits of the procedure and the sedation                            options and risks were discussed with the patient.                            All questions were answered, and informed consent                            was obtained. Prior Anticoagulants: The patient has                            taken no previous anticoagulant or antiplatelet                            agents. ASA Grade Assessment: II - A patient with                            mild systemic  disease. After reviewing the risks                            and benefits, the patient was deemed in                            satisfactory condition to undergo the procedure.                           After obtaining informed consent, the scope was                            passed under direct vision. Throughout the                            procedure, the patient's blood pressure, pulse, and                             oxygen saturations were monitored continuously. The                            [id number]) scope was introduced through                            the mouth, and used to inject contrast into and                            used to locate the major papilla. The ERCP was                            somewhat difficult due to challenging cannulation.                            The patient tolerated the procedure well. Scope In: Scope Out: Findings:      A biliary sphincterotomy had been performed. The sphincterotomy appeared       With previous sphincterotomy evidence. after unsuccessful cannulation       with him on his side we proceeded with a Biliary sphincterotomy was made       with a Hydratome sphincterotome using ERBE electrocautery.and were able       to get deep selective cannulation initially the wire went into the       cystic duct and possibly gallbladder but the sphincterotome and wire       were repositioned and deep selective cannulation was obtained There was       no post-sphincterotomy bleeding.he did have a short distal stricture       which was brushed and Cells for cytology were obtained by brushing. and       then we proceeded with placing One 10 Fr by 4 cm covered metal stent       with no external flaps and no internal flaps was placed into the common       bile duct. Bile flowed through the stent. The stent was in good position. Impression:               - Prior  biliary endoscopic sphincterotomy.                           - A biliary sphincterotomy was performed. no                            pancreatic duct injections were wire advancement's                            was done throughout the procedure                           - One 4 cm long10 [place] covered metal stent was                            placed into the common bile duct. after brushing Moderate Sedation:      N/A- Per Anesthesia Care Recommendation:           - Avoid  aspirin and nonsteroidal anti-inflammatory                            medicines for 5 days.                           - Continue present medications. observe for delayed                            complications                           - Soft diet today.                           - Await cytology results. pending results might                            still need EUS at some point and probable surgical                            consultation                           - Return to GI clinic in 1 week.                           - Telephone GI clinic if symptomatic PRN.                           - Telephone GI clinic if symptomatic PRN. Procedure Code(s):        --- Professional ---                           [phone], Esophagogastroduodenoscopy, flexible,                            transoral; diagnostic, including collection of  specimen(s) by brushing or washing, when performed                            (separate procedure) Diagnosis Code(s):        --- Professional ---                           K83.8, Other specified diseases of biliary tract CPT copyright 2016 [employer]. All rights reserved. The codes documented in this report are preliminary and upon coder review may  be revised to meet current compliance requirements. Clarene Essex, MD 08/21/2015 12:57:23 PM This report has been signed electronically. Number of Addenda: 0

## 2015-08-21 NOTE — Anesthesia Postprocedure Evaluation (Signed)
Anesthesia Post Note  Patient: Dustin Lucas  Procedure(s) Performed: Procedure(s) (LRB): ENDOSCOPIC RETROGRADE CHOLANGIOPANCREATOGRAPHY (ERCP) (N/A)  Patient location during evaluation: PACU Anesthesia Type: General Level of consciousness: awake and alert Pain management: pain level controlled Vital Signs Assessment: post-procedure vital signs reviewed and stable Respiratory status: spontaneous breathing, nonlabored ventilation, respiratory function stable and patient connected to nasal cannula oxygen Cardiovascular status: blood pressure returned to baseline and stable Postop Assessment: no signs of nausea or vomiting Anesthetic complications: no    Last Vitals:  Vitals:   08/21/15 1316 08/21/15 1330  BP: (!) 140/99 (!) 142/78  Pulse: 64 69  Resp: 10 18  Temp:      Last Pain:  Vitals:   08/21/15 1315  TempSrc:   PainSc: 4                  Nahla Lukin Minda Meo

## 2015-08-21 NOTE — Anesthesia Preprocedure Evaluation (Signed)
Anesthesia Evaluation  Patient identified by MRN, date of birth, ID band Patient awake    Reviewed: Allergy & Precautions, NPO status , Patient's Chart, lab work & pertinent test results  History of Anesthesia Complications (+) PONV and history of anesthetic complications  Airway Mallampati: II   Neck ROM: full    Dental   Pulmonary Current Smoker,    breath sounds clear to auscultation       Cardiovascular hypertension,  Rhythm:regular Rate:Normal     Neuro/Psych    GI/Hepatic   Endo/Other    Renal/GU      Musculoskeletal  (+) Arthritis ,   Abdominal   Peds  Hematology   Anesthesia Other Findings   Reproductive/Obstetrics                             Anesthesia Physical  Anesthesia Plan  ASA: II  Anesthesia Plan: General   Post-op Pain Management:    Induction: Intravenous  Airway Management Planned: Oral ETT  Additional Equipment:   Intra-op Plan:   Post-operative Plan: Extubation in OR  Informed Consent: I have reviewed the patients History and Physical, chart, labs and discussed the procedure including the risks, benefits and alternatives for the proposed anesthesia with the patient or authorized representative who has indicated his/her understanding and acceptance.     Plan Discussed with: CRNA, Anesthesiologist and Surgeon  Anesthesia Plan Comments:         Anesthesia Quick Evaluation

## 2015-08-21 NOTE — Progress Notes (Signed)
Dustin Lucas 10:12 AM  Subjective: Patient doing well without any specific complaints and no obvious complications from yesterday's procedure  Objective: Vital signs stable afebrile no acute distress exam please see preassessment evaluation labs stable liver tests slightly decreased  Assessment: CBD obstruction  Plan: Okay to proceed with repeat ERCP with anesthesia assistance and the procedure was discussed with the patient this morning as well as last night on the phone and yesterday in the hospital as well  [facility] E  Pager [phone] After 5PM or if no answer call [phone]

## 2015-08-22 ENCOUNTER — Encounter (HOSPITAL_COMMUNITY): Payer: Self-pay | Admitting: Gastroenterology

## 2015-08-22 DIAGNOSIS — R945 Abnormal results of liver function studies: Secondary | ICD-10-CM | POA: Diagnosis not present

## 2015-08-22 DIAGNOSIS — R7989 Other specified abnormal findings of blood chemistry: Secondary | ICD-10-CM

## 2015-08-22 LAB — CBC
HEMATOCRIT: 36.5 % — AB (ref 39.0–52.0)
HEMOGLOBIN: 12.6 g/dL — AB (ref 13.0–17.0)
MCH: 31.7 pg (ref 26.0–34.0)
MCHC: 34.5 g/dL (ref 30.0–36.0)
MCV: 91.9 fL (ref 78.0–100.0)
Platelets: 151 10*3/uL (ref 150–400)
RBC: 3.97 MIL/uL — ABNORMAL LOW (ref 4.22–5.81)
RDW: 18 % — ABNORMAL HIGH (ref 11.5–15.5)
WBC: 4.9 10*3/uL (ref 4.0–10.5)

## 2015-08-22 LAB — COMPREHENSIVE METABOLIC PANEL
ALT: 143 U/L — ABNORMAL HIGH (ref 17–63)
ANION GAP: 5 (ref 5–15)
AST: 147 U/L — ABNORMAL HIGH (ref 15–41)
Albumin: 2.5 g/dL — ABNORMAL LOW (ref 3.5–5.0)
Alkaline Phosphatase: 205 U/L — ABNORMAL HIGH (ref 38–126)
BUN: 7 mg/dL (ref 6–20)
CHLORIDE: 109 mmol/L (ref 101–111)
CO2: 25 mmol/L (ref 22–32)
Calcium: 8.7 mg/dL — ABNORMAL LOW (ref 8.9–10.3)
Creatinine, Ser: 0.59 mg/dL — ABNORMAL LOW (ref 0.61–1.24)
GFR calc Af Amer: >60 mL/min (ref >60)
GFR calc non Af Amer: >60 mL/min (ref >60)
GLUCOSE: 103 mg/dL — AB (ref 65–99)
POTASSIUM: 3.9 mmol/L (ref 3.5–5.1)
Sodium: 139 mmol/L (ref 135–145)
Total Bilirubin: 12.2 mg/dL — ABNORMAL HIGH (ref 0.3–1.2)
Total Protein: 5.5 g/dL — ABNORMAL LOW (ref 6.5–8.1)

## 2015-08-22 MED ORDER — TRAMADOL HCL 50 MG PO TABS: 50.0000 mg | ORAL_TABLET | Freq: Four times a day (QID) | ORAL | 0 refills | Status: DC | PRN

## 2015-08-22 MED ORDER — ASPIRIN 81 MG PO CHEW: 81.0000 mg | CHEWABLE_TABLET | Freq: Every day | ORAL | Status: DC

## 2015-08-22 MED ORDER — ACETAMINOPHEN 325 MG PO TABS: 650.0000 mg | ORAL_TABLET | Freq: Four times a day (QID) | ORAL | Status: DC | PRN

## 2015-08-22 MED ORDER — CIPROFLOXACIN HCL 500 MG PO TABS: 500.0000 mg | ORAL_TABLET | Freq: Two times a day (BID) | ORAL | 0 refills | Status: DC

## 2015-08-22 NOTE — Progress Notes (Signed)
Patient discharged to home, all discharge medications and instructions reviewed and questions answered.  Patient to be assisted to vehicle by wheelchair.  

## 2015-08-22 NOTE — Discharge Instructions (Signed)
Endoscopic Retrograde Cholangiopancreatography (ERCP) Endoscopic retrograde cholangiopancreatography (ERCP) is a procedure used to diagnosis many diseases of the pancreas, bile ducts, liver, and gallbladder. During ERCP a thin, lighted tube (endoscope) is passed through the mouth and down the back of the throat into the first part of the small intestine (duodenum). A small, plastic tube (cannula) is then passed through the endoscope and directed into the bile duct or pancreatic duct. Dye is then injected through the cannula and X-rays are taken to study the biliary and pancreatic passageways.  LET [facility] CARE PROVIDER KNOW ABOUT:   Any allergies you have.   All medicines you are taking, including vitamins, herbs, eyedrops, creams, and over-the-counter medicines.   Previous problems you or members of your family have had with the use of anesthetics.   Any blood disorders you have.   Previous surgeries you have had.   Medical conditions you have. RISKS AND COMPLICATIONS Generally, ERCP is a safe procedure. However, as with any procedure, complications can occur. A simple removal of gallstones has the lowest rate of complications. Higher rates of complication occur in people who have poorly functioning bile or pancreatic ducts. Possible complications include:   Pancreatitis.  Bleeding.  Accidental punctures in the bowel wall, pancreas, or gall bladder.  Gall bladder or bile duct infection. BEFORE THE PROCEDURE   Do not eat or drink anything, including water, for at least 8 hours before the procedure or as directed by your health care provider.   Ask your health care provider whether you should stop taking certain medicines prior to your procedure.   Arrange for someone to drive you home. You will not be allowed to drive for [id number] hours after the procedure. PROCEDURE   You will be given medicine through a vein (intravenously) to make you relaxed and sleepy.   You might  have a breathing tube placed to give you medicine that makes you sleep (general anesthetic).   Your throat may be sprayed with medicine that numbs the area and prevents gagging (local anesthetic), or you may gargle this medicine.   You will lie on your left side.   The endoscope will be inserted through your mouth and into the duodenum. The tube will not interfere with your breathing. Gagging is prevented by the anesthesia.   While X-rays are being taken, you may be positioned on your stomach.   A small sample of tissue (biopsy) may be removed for examination. AFTER THE PROCEDURE   You will rest in bed until you are fully conscious.   When you first wake up, your throat may feel slightly sore.   You will not be allowed to eat or drink until numbness subsides.   Once you are able to drink, urinate, and sit on the edge of the bed without feeling sick to your stomach (nauseous) or dizzy, you may be allowed to go home.   This information is not intended to replace advice given to you by your health care provider. Make sure you discuss any questions you have with your health care provider.   Document Released: 09/12/2000 Document Revised: 10/08/2012 Document Reviewed: 07/29/2012 Elsevier Interactive Patient Education [employer].

## 2015-08-22 NOTE — Progress Notes (Signed)
Subjective: Feeling well.  Some soreness s/p stent placement.  Objective: Vital signs in last 24 hours: Temp:  [98.3 F (36.8 C)-98.7 F (37.1 C)] 98.7 F (37.1 C) (08/21 1331) Pulse Rate:  [67-73] 67 (08/21 1331) Resp:  [16-18] 18 (08/21 1331) BP: (135-160)/(69-84) 160/83 (08/21 1331) SpO2:  [100 %] 100 % (08/21 1331) Last BM Date: 08/20/15  Intake/Output from previous day: 08/20 0701 - 08/21 0700 In: 1105 [P.O.:20; I.V.:1075; IV Piggyback:10] Out: 0  Intake/Output this shift: No intake/output data recorded.  General appearance: alert and no distress GI: soft, non-tender; bowel sounds normal; no masses,  no organomegaly  Lab Results:  Recent Labs  08/21/15 0435 08/22/15 0436  WBC 5.0 4.9  HGB 12.8* 12.6*  HCT 36.8* 36.5*  PLT 155 151   BMET  Recent Labs  08/20/15 0401 08/21/15 0435 08/22/15 0436  NA 139 140 139  K 4.4 3.4* 3.9  CL 108 110 109  CO2 24 23 25   GLUCOSE 88 116* 103*  BUN 7 10 7   CREATININE 0.50* 0.65 0.59*  CALCIUM 8.9 8.7* 8.7*   LFT  Recent Labs  08/22/15 0436  PROT 5.5*  ALBUMIN 2.5*  AST 147*  ALT 143*  ALKPHOS 205*  BILITOT 12.2*   PT/INR No results for input(s): LABPROT, INR in the last 72 hours. Hepatitis Panel No results for input(s): HEPBSAG, HCVAB, HEPAIGM, HEPBIGM in the last 72 hours. C-Diff No results for input(s): CDIFFTOX in the last 72 hours. Fecal Lactopherrin No results for input(s): FECLLACTOFRN in the last 72 hours.  Studies/Results: Dg Ercp Biliary & Pancreatic Ducts  Result Date: 08/21/2015 CLINICAL DATA:  Biliary dilatation EXAM: ERCP TECHNIQUE: Multiple spot images obtained with the fluoroscopic device and submitted for interpretation post-procedure. FLUOROSCOPY TIME:  Radiation Exposure Index (as provided by the fluoroscopic device): 212.85 mGy If the device does not provide the exposure index: Fluoroscopy Time:  8 minutes 42 seconds Number of Acquired Images:  3 COMPARISON:  None. FINDINGS: Initial  image demonstrates significant dilatation of the common bile duct and intrahepatic biliary tree. Minimal visualization of the gallbladder is noted. A guidewire was passed into the common bile duct through the area of stricturing. By history a sphincterotomy was performed and covered metal stent was placed into the distal common bile duct. IMPRESSION: Distal common bile duct stricture with subsequent sphincterotomy and stent placement. These images were submitted for radiologic interpretation only. Please see the procedural report for the amount of contrast and the fluoroscopy time utilized. Electronically Signed   By: Inez Catalina M.D.   On: 08/21/2015 16:05    Medications:  Scheduled: . amLODipine  5 mg Oral Daily   And  . irbesartan  300 mg Oral Daily  . ciprofloxacin  500 mg Oral BID  . dutasteride  0.5 mg Oral Daily  . famotidine  20 mg Oral BID  . folic acid  [id number] mcg Oral Daily   Continuous: . lactated ringers 50 mL/hr at 08/21/15 1427    Assessment/Plan: 1) Pancreatic head mass. 2) CBD obstruction s/p stenting.   Stent was successful, but there is no significant drop in the TB at this time.  This may never completely normalize.  I think he can go home and he will undergo outpatient evaluation of the mass with an EUS later this week.  Plan: 1) Okay to D/C home. 2) I will arrange for an EUS this week.  LOS: 0 days   Jowanda Heeg D 08/22/2015, 5:22 PM

## 2015-08-22 NOTE — Discharge Summary (Signed)
Physician Discharge Summary  Dustin Lucas [id number] DOB: 06/26/1940 DOA: 08/18/2015  PCP: Benito Mccreedy, MD  Admit date: 08/18/2015 Discharge date: 08/22/2015  Recommendations for Outpatient Follow-up:  1. Pt will need to follow up with PCP in 1-2 weeks post discharge 2. Also advised to see GI specialist in ine week for further follow up  3. Please obtain BMP to evaluate electrolytes and kidney function, liver function tests  4. Please also check CBC to evaluate Hg and Hct levels  Discharge Diagnoses:  Active Problems:   Jaundice   Abnormal LFTs   Cholelithiasis without cholecystitis   Essential hypertension, benign   Hypokalemia  Discharge Condition: Stable  Diet recommendation: Heart healthy diet discussed in details   Brief Narrative:  75 year old male with past medical history of hip replacement, left ankle fracture, prostate cancer, hypertension who presented to [facility] with jaundice, dark urine for past month. Patient also reported about 10 pound weight loss and poor by mouth intake for the same amount of time. He went to see primary care physician and workup in their office revealed AST 228, ALT 337, ALP 292 and total bili of 13. Ultrasound performed 08/12/2015 showed CBD ductal dilatation at 1.6 cm with stones and sludge noted in gallbladder. He was directly admitted to the hospital. Noncontrast CT scan of the abdomen showed dilatation of the common bile duct up to 2 cm in diameter and diffuse distention of the gallbladder with underlying cholelithiasis, raising concern for distal obstruction. Mass cannot be excluded. There is associated dilatation of the pancreatic duct up to 9 mm in diameter. Patient was seen by GI in consultation and plan is for ERCP.   Assessment & Plan:   Active Problems: Obstructive jaundice / abnormal LFTs / cholelithiasis - Abdominal ultrasound performed 08/12/2015 showed CBD ductal dilatation at 1.6 cm with stones and sludge  noted in gallbladder - CT scan of the abdomen showed dilatation of the common bile duct up to 2 cm in diameter and diffuse distention of the gallbladder with underlying cholelithiasis, raising concern for distal obstruction. Mass cannot be excluded. There is associated dilatation of the pancreatic duct up to 9 mm in diameter.  - Patient was seen by GI in consultation - ERCP done 8/19 with biliary sphincterotomy but unable to place stent so repeat ERCP done again 8/20 with successful stent placement by Dr. Watt Climes - cytology pending, pt wants to go home and was advised he needs to follow up with GI doctor in one week for further recommendations as pt may need EUS or surgery  - complete therapy with Cipro   Essential hypertension - continue home medical regimen   Hypokalemia - Due to GI losses - Supplemented  Code Status: full code  Family Communication: No family at the bedside Disposition Plan: Home    Consultants:   GI, Dr. Carol Ada and Dr. Clarene Essex   Procedures:   ERCP 8/19 -  Biliary sphincterotomy performed, stent not place  Repeat ERCP 8/20, stent placed   Antimicrobials:   Cipro 08/18/2015 -->  Procedures/Studies: Ct Abdomen Pelvis Wo Contrast  Result Date: 08/18/2015 CLINICAL DATA:  Acute onset of abnormal LFTs.  Initial encounter. EXAM: CT ABDOMEN AND PELVIS WITHOUT CONTRAST TECHNIQUE: Multidetector CT imaging of the abdomen and pelvis was performed following the standard protocol without IV contrast. COMPARISON:  CT of the abdomen and pelvis from 12/05/2010 FINDINGS: The visualized lung bases are clear. Mild coronary artery calcifications are seen. There is marked dilatation of the  common bile duct to 2.0 cm in diameter, with diffuse distention of the gallbladder. Small stones are seen within the gallbladder. Diffuse intrahepatic biliary ductal dilatation is seen. This raises concern for distal obstruction. Underlying mass cannot be excluded. There is also  dilatation of the pancreatic duct to 9 mm in diameter. The spleen is unremarkable in appearance. Vague nonspecific fullness is noted about the head of the pancreas. The adrenal glands are grossly unremarkable. Scattered bilateral renal cysts are seen. The kidneys are otherwise grossly unremarkable. There is no evidence of hydronephrosis. No renal or ureteral stones are identified. No perinephric stranding is seen. No free fluid is identified. The small bowel is unremarkable in appearance. The stomach is within normal limits. No acute vascular abnormalities are seen. Scattered calcification is seen along the abdominal aorta and its branches. The appendix is normal in caliber and contains air, without evidence of appendicitis. The sigmoid colon is somewhat redundant. The colon is grossly unremarkable in appearance. The bladder is moderately distended, with scattered diverticula noted. The prostate is borderline enlarged, measuring 4.9 cm in transverse dimension. No inguinal lymphadenopathy is seen. No acute osseous abnormalities are identified. The patient's right hip arthroplasty is incompletely imaged but appears grossly unremarkable. There is mild grade 1 anterolisthesis of L4 on L5. Vacuum phenomenon is noted at L5-S1. IMPRESSION: 1. Marked dilatation of the common bile duct to 2.0 cm in diameter, with diffuse distention of the gallbladder. Underlying cholelithiasis noted. Diffuse intrahepatic biliary ductal dilatation seen. This raises concern for distal obstruction. Underlying mass cannot be excluded. Associated dilatation of the pancreatic duct to 9 mm in diameter. Vague nonspecific fullness noted about the head of the pancreas. MRCP is recommended for further evaluation. 2. Scattered calcification along the abdominal aorta and its branches. 3. Mild coronary artery calcifications seen. 4. Scattered bilateral renal cysts noted. 5. Borderline enlarged prostate. Electronically Signed   By: Garald Balding M.D.    On: 08/18/2015 21:50   Dg Ercp Biliary & Pancreatic Ducts  Result Date: 08/21/2015 CLINICAL DATA:  Biliary dilatation EXAM: ERCP TECHNIQUE: Multiple spot images obtained with the fluoroscopic device and submitted for interpretation post-procedure. FLUOROSCOPY TIME:  Radiation Exposure Index (as provided by the fluoroscopic device): 212.85 mGy If the device does not provide the exposure index: Fluoroscopy Time:  8 minutes 42 seconds Number of Acquired Images:  3 COMPARISON:  None. FINDINGS: Initial image demonstrates significant dilatation of the common bile duct and intrahepatic biliary tree. Minimal visualization of the gallbladder is noted. A guidewire was passed into the common bile duct through the area of stricturing. By history a sphincterotomy was performed and covered metal stent was placed into the distal common bile duct. IMPRESSION: Distal common bile duct stricture with subsequent sphincterotomy and stent placement. These images were submitted for radiologic interpretation only. Please see the procedural report for the amount of contrast and the fluoroscopy time utilized. Electronically Signed   By: Inez Catalina M.D.   On: 08/21/2015 16:05   Dg C-arm 61-120 Min-no Report  Result Date: 08/20/2015 CLINICAL DATA: Obstruction of bile duct  C-ARM 61-120 MINUTES Fluoroscopy was utilized by the requesting physician.  No radiographic interpretation.   US Abdomen Limited Ruq  Result Date: 08/12/2015 CLINICAL DATA:  Abnormal liver enzymes EXAM: US ABDOMEN LIMITED - RIGHT UPPER QUADRANT COMPARISON:  Abdominal and pelvic CT scan of December 05, 2010 which revealed gallstones. FINDINGS: Gallbladder: Again demonstrated are echogenic mobile shadowing stones. There is also sludge. No gallbladder wall thickening, pericholecystic fluid, or positive  sonographic Murphy's sign are observed. Common bile duct: Diameter: 1.6 cm.  No intraluminal stones or sludge are observed. Liver: The hepatic echotexture is normal.  There is dilation of the intrahepatic biliary tree. IMPRESSION: 1. Gallstones without sonographic evidence of acute cholecystitis. 2. Dilation of the common bile duct and intrahepatic ducts which may indicate a distal common bile duct stone or obstructive process such is pancreatic head mass or ampullary mass. MRCP would be a useful next imaging step. Electronically Signed   By: David  Martinique M.D.   On: 08/12/2015 09:56     Discharge Exam: Vitals:   08/22/15 0600 08/22/15 1331  BP: (!) 160/84 (!) 160/83  Pulse: 68 67  Resp: 16 18  Temp: 98.5 F (36.9 C) 98.7 F (37.1 C)   Vitals:   08/21/15 1824 08/21/15 2027 08/22/15 0600 08/22/15 1331  BP:  135/69 (!) 160/84 (!) 160/83  Pulse:  73 68 67  Resp:  16 16 18   Temp:  98.3 F (36.8 C) 98.5 F (36.9 C) 98.7 F (37.1 C)  TempSrc:  Oral Oral Oral  SpO2: 100% 100% 100% 100%  Weight:      Height:        General: Pt is alert, follows commands appropriately, not in acute distress Cardiovascular: Regular rate and rhythm, S1/S2 +, no murmurs, no rubs, no gallops Respiratory: Clear to auscultation bilaterally, no wheezing, no crackles, no rhonchi Abdominal: Soft, non tender, non distended, bowel sounds +, no guarding   Discharge Instructions  Discharge Instructions    Diet - low sodium heart healthy    Complete by:  As directed   Increase activity slowly    Complete by:  As directed       Medication List    TAKE these medications   acetaminophen 325 MG tablet Commonly known as:  TYLENOL Take 2 tablets (650 mg total) by mouth every 6 (six) hours as needed for mild pain (or Fever >/= 101).   amLODipine-valsartan 5-320 MG tablet Commonly known as:  EXFORGE Take 1 tablet by mouth daily.   aspirin 81 MG chewable tablet Chew 1 tablet (81 mg total) by mouth daily. Start taking 08/29/2015 Start taking on:  08/29/2015 What changed:  how much to take  additional instructions   ciprofloxacin 500 MG tablet Commonly known as:   CIPRO Take 1 tablet (500 mg total) by mouth 2 (two) times daily. Take for three days   dutasteride 0.5 MG capsule Commonly known as:  AVODART Take 0.5 mg by mouth daily.   folic acid A999333 MCG tablet Commonly known as:  FOLVITE Take 400 mcg by mouth daily.   traMADol 50 MG tablet Commonly known as:  ULTRAM Take 1 tablet (50 mg total) by mouth every 6 (six) hours as needed.   Vitamin D-3 1000 units Caps Take 2,000 Units by mouth daily.      Follow-up Information    [name],[name], MD .   Specialty:  Internal Medicine Contact information: [id number] [place] [address] [phone]        [facility] E, MD. Call today.   Specialty:  Gastroenterology Why:  Please call to schedule an appointment as soon as possible  Contact information: [address] [place] [address] [phone]            The results of significant diagnostics from this hospitalization (including imaging, microbiology, ancillary and laboratory) are listed below for reference.     Microbiology: Recent Results (from the  past 240 hour(s))  Surgical PCR screen     Status: None   Collection Time: 08/20/15  4:16 AM  Result Value Ref Range Status   MRSA, PCR NEGATIVE NEGATIVE Final   Staphylococcus aureus NEGATIVE NEGATIVE Final     Labs: Basic Metabolic Panel:  Recent Labs Lab 08/18/15 1949 08/20/15 0401 08/21/15 0435 08/22/15 0436  NA 140 139 140 139  K 3.3* 4.4 3.4* 3.9  CL 106 108 110 109  CO2 26 24 23 25   GLUCOSE 97 88 116* 103*  BUN 11 7 10 7   CREATININE 0.75 0.50* 0.65 0.59*  CALCIUM 9.0 8.9 8.7* 8.7*  MG 2.0  --   --   --    Liver Function Tests:  Recent Labs Lab 08/18/15 1949 08/21/15 0435 08/22/15 0436  AST 179* 147* 147*  ALT 209* 149* 143*  ALKPHOS 244* 190* 205*  BILITOT 15.3* 13.5* 12.2*  PROT 6.9 5.7* 5.5*  ALBUMIN 3.1* 2.5* 2.5*   CBC:  Recent Labs Lab 08/18/15 1949 08/21/15 0435 08/22/15 0436  WBC 5.0 5.0  4.9  NEUTROABS 2.9 3.1  --   HGB 13.6 12.8* 12.6*  HCT 38.2* 36.8* 36.5*  MCV 90.1 91.3 91.9  PLT 172 155 151    CBG:  Recent Labs Lab 08/20/15 1321 08/20/15 1405 08/20/15 1715 08/20/15 2158 08/21/15 0936  GLUCAP 60* 77 103* 155* 113*    SIGNED: Time coordinating discharge:  30 minutes  MAGICK-Neko Boyajian, MD  [facility] 08/22/2015, 2:41 PM Pager [phone]  If 7PM-7AM, please contact night-coverage [email or web address] Password [email or web address]

## 2015-08-23 ENCOUNTER — Encounter (HOSPITAL_COMMUNITY): Payer: Self-pay | Admitting: *Deleted

## 2015-08-23 ENCOUNTER — Other Ambulatory Visit: Payer: Self-pay | Admitting: Gastroenterology

## 2015-08-23 LAB — CANCER ANTIGEN 19-9: CA 19 9: 910 U/mL — AB (ref 0–35)

## 2015-08-24 ENCOUNTER — Encounter (HOSPITAL_COMMUNITY): Payer: Self-pay | Admitting: Gastroenterology

## 2015-08-25 ENCOUNTER — Ambulatory Visit (HOSPITAL_COMMUNITY): Admission: RE | Admit: 2015-08-25 | Payer: Medicare Other | Source: Ambulatory Visit | Admitting: Gastroenterology

## 2015-08-25 ENCOUNTER — Encounter (HOSPITAL_COMMUNITY): Payer: Self-pay | Admitting: *Deleted

## 2015-08-25 ENCOUNTER — Encounter (HOSPITAL_COMMUNITY): Payer: Self-pay | Admitting: Anesthesiology

## 2015-08-25 SURGERY — UPPER ENDOSCOPIC ULTRASOUND (EUS) LINEAR
Anesthesia: Monitor Anesthesia Care

## 2015-08-25 MED ORDER — LIDOCAINE HCL (CARDIAC) 20 MG/ML IV SOLN
INTRAVENOUS | Status: AC
  Filled 2015-08-25: qty 5

## 2015-08-25 MED ORDER — PROPOFOL 10 MG/ML IV BOLUS
INTRAVENOUS | Status: AC
  Filled 2015-08-25: qty 40

## 2015-08-26 ENCOUNTER — Other Ambulatory Visit: Payer: Self-pay | Admitting: Gastroenterology

## 2015-08-29 ENCOUNTER — Ambulatory Visit (HOSPITAL_COMMUNITY)
Admission: RE | Admit: 2015-08-29 | Discharge: 2015-08-29 | Disposition: A | Discharge status: Home / Self Care | Payer: Medicare Other | Source: Ambulatory Visit | Attending: Gastroenterology | Admitting: Gastroenterology

## 2015-08-29 ENCOUNTER — Ambulatory Visit (HOSPITAL_COMMUNITY): Payer: Medicare Other | Admitting: Anesthesiology

## 2015-08-29 ENCOUNTER — Other Ambulatory Visit: Payer: Self-pay

## 2015-08-29 ENCOUNTER — Encounter (HOSPITAL_COMMUNITY): Payer: Self-pay | Admitting: *Deleted

## 2015-08-29 ENCOUNTER — Encounter (HOSPITAL_COMMUNITY)
Admission: RE | Disposition: A | Discharge status: Home / Self Care | Payer: Self-pay | Source: Ambulatory Visit | Attending: Gastroenterology

## 2015-08-29 DIAGNOSIS — Z8546 Personal history of malignant neoplasm of prostate: Secondary | ICD-10-CM | POA: Insufficient documentation

## 2015-08-29 DIAGNOSIS — I10 Essential (primary) hypertension: Secondary | ICD-10-CM | POA: Diagnosis not present

## 2015-08-29 DIAGNOSIS — R748 Abnormal levels of other serum enzymes: Secondary | ICD-10-CM | POA: Insufficient documentation

## 2015-08-29 DIAGNOSIS — R978 Other abnormal tumor markers: Secondary | ICD-10-CM | POA: Insufficient documentation

## 2015-08-29 DIAGNOSIS — Z91013 Allergy to seafood: Secondary | ICD-10-CM | POA: Insufficient documentation

## 2015-08-29 DIAGNOSIS — Z96641 Presence of right artificial hip joint: Secondary | ICD-10-CM | POA: Diagnosis not present

## 2015-08-29 DIAGNOSIS — Z7982 Long term (current) use of aspirin: Secondary | ICD-10-CM | POA: Insufficient documentation

## 2015-08-29 DIAGNOSIS — K831 Obstruction of bile duct: Secondary | ICD-10-CM | POA: Insufficient documentation

## 2015-08-29 DIAGNOSIS — Z79899 Other long term (current) drug therapy: Secondary | ICD-10-CM | POA: Insufficient documentation

## 2015-08-29 DIAGNOSIS — E876 Hypokalemia: Secondary | ICD-10-CM | POA: Diagnosis not present

## 2015-08-29 DIAGNOSIS — K869 Disease of pancreas, unspecified: Secondary | ICD-10-CM | POA: Diagnosis not present

## 2015-08-29 DIAGNOSIS — K838 Other specified diseases of biliary tract: Secondary | ICD-10-CM | POA: Insufficient documentation

## 2015-08-29 HISTORY — PX: UPPER ESOPHAGEAL ENDOSCOPIC ULTRASOUND (EUS): SHX6562

## 2015-08-29 HISTORY — PX: ESOPHAGOGASTRODUODENOSCOPY (EGD) WITH PROPOFOL: SHX5813

## 2015-08-29 SURGERY — ESOPHAGOGASTRODUODENOSCOPY (EGD) WITH PROPOFOL
Anesthesia: General

## 2015-08-29 MED ORDER — LACTATED RINGERS IV SOLN
INTRAVENOUS | Status: DC
  Administered 2015-08-29: 10:00:00 via INTRAVENOUS

## 2015-08-29 MED ORDER — LACTATED RINGERS IV SOLN
INTRAVENOUS | Status: DC
  Administered 2015-08-29: 1000 mL via INTRAVENOUS

## 2015-08-29 MED ORDER — SODIUM CHLORIDE 0.9 % IV SOLN: INTRAVENOUS | Status: DC

## 2015-08-29 MED ORDER — PROPOFOL 10 MG/ML IV BOLUS
INTRAVENOUS | Status: AC
  Filled 2015-08-29: qty 20

## 2015-08-29 MED ORDER — PROPOFOL 500 MG/50ML IV EMUL
INTRAVENOUS | Status: DC | PRN
  Administered 2015-08-29: 25 ug/kg/min via INTRAVENOUS

## 2015-08-29 MED ORDER — ONDANSETRON HCL 4 MG/2ML IJ SOLN
INTRAMUSCULAR | Status: DC | PRN
  Administered 2015-08-29: 4 mg via INTRAVENOUS

## 2015-08-29 MED ORDER — PROPOFOL 10 MG/ML IV BOLUS
INTRAVENOUS | Status: AC
  Filled 2015-08-29: qty 40

## 2015-08-29 MED ORDER — PHENYLEPHRINE 40 MCG/ML (10ML) SYRINGE FOR IV PUSH (FOR BLOOD PRESSURE SUPPORT)
PREFILLED_SYRINGE | INTRAVENOUS | Status: AC
  Filled 2015-08-29: qty 20

## 2015-08-29 MED ORDER — ONDANSETRON HCL 4 MG/2ML IJ SOLN
INTRAMUSCULAR | Status: AC
  Filled 2015-08-29: qty 2

## 2015-08-29 MED ORDER — LIDOCAINE HCL (CARDIAC) 20 MG/ML IV SOLN
INTRAVENOUS | Status: AC
  Filled 2015-08-29: qty 5

## 2015-08-29 SURGICAL SUPPLY — 15 items

## 2015-08-29 NOTE — Interval H&P Note (Signed)
History and Physical Interval Note:  08/29/2015 9:04 AM  Karren Cobble  has presented today for surgery, with the diagnosis of Pancreatic mass  The various methods of treatment have been discussed with the patient and family. After consideration of risks, benefits and other options for treatment, the patient has consented to  Procedure(s): ESOPHAGOGASTRODUODENOSCOPY (EGD) WITH PROPOFOL (N/A) UPPER ESOPHAGEAL ENDOSCOPIC ULTRASOUND (EUS) (N/A) as a surgical intervention .  The patient's history has been reviewed, patient examined, no change in status, stable for surgery.  I have reviewed the patient's chart and labs.  Questions were answered to the patient's satisfaction.  Assessment:  1.  Obstructive jaundice, with biliary wallstent placed. 2.  Elevated LFTs, downtrending after stent placement. 3.  Double duct sign and elevated Ca 19-9, worrisome for pancreatic cancer.  Plan:  1.  Endoscopic ultrasound with possible fine needle aspiration biopsies. 2.  Risks (bleeding, infection, bowel perforation that could require surgery, sedation-related changes in cardiopulmonary systems), benefits (identification and possible treatment of source of symptoms, exclusion of certain causes of symptoms), and alternatives (watchful waiting, radiographic imaging studies, empiric medical treatment) of upper endoscopy with ultrasound and possible biopsies (EUS +/- FNA) were explained to patient/family in detail and patient wishes to proceed.   Landry Dyke

## 2015-08-29 NOTE — Transfer of Care (Signed)
Immediate Anesthesia Transfer of Care Note  Patient: Dustin Lucas  Procedure(s) Performed: Procedure(s): ESOPHAGOGASTRODUODENOSCOPY (EGD) WITH PROPOFOL (N/A) UPPER ESOPHAGEAL ENDOSCOPIC ULTRASOUND (EUS) (N/A)  Patient Location: PACU  Anesthesia Type:MAC  Level of Consciousness:  sedated, patient cooperative and responds to stimulation  Airway & Oxygen Therapy:Patient Spontanous Breathing and Patient connected to face mask oxgen  Post-op Assessment:  Report given to PACU RN and Post -op Vital signs reviewed and stable  Post vital signs:  Reviewed and stable  Last Vitals:  Vitals:   08/29/15 0815  BP: (!) 171/96  Resp: 10  Temp: [id number] C    Complications: No apparent anesthesia complications

## 2015-08-29 NOTE — Anesthesia Postprocedure Evaluation (Signed)
Anesthesia Post Note  Patient: Dustin Lucas  Procedure(s) Performed: Procedure(s) (LRB): ESOPHAGOGASTRODUODENOSCOPY (EGD) WITH PROPOFOL (N/A) UPPER ESOPHAGEAL ENDOSCOPIC ULTRASOUND (EUS) (N/A)  Patient location during evaluation: Endoscopy Pain management: pain level controlled Vital Signs Assessment: post-procedure vital signs reviewed and stable Respiratory status: spontaneous breathing Cardiovascular status: stable    Last Vitals:  Vitals:   08/29/15 1100 08/29/15 1110  BP: (!) 186/86 (!) 194/86  Pulse: 68 71  Resp: 20 12  Temp:      Last Pain:  Vitals:   08/29/15 1039  TempSrc: Oral                 EDWARDS,Anetria Harwick

## 2015-08-29 NOTE — Op Note (Signed)
[facility] Patient Name: Dustin Lucas Procedure Date: 08/29/2015 MRN: [id number] Attending MD: Arta Silence , MD Date of Birth: [birth year] CSN: [id number] Age: 75 Admit Type: Outpatient Procedure:                Upper EUS Indications:              Common bile duct dilation (etiology unknown) seen                            on CT scan, Dilated pancreatic duct on CT scan,                            Suspected mass in pancreas on CT scan, Abnormal                            pancreatic tests (elevated Ca 19-9), Elevated liver                            enzymes Providers:                Arta Silence, MD, Cleda Daub, RN, Elspeth Cho Tech., Technician, Christell Faith, CRNA Referring MD:             Clarene Essex, MD Medicines:                Monitored Anesthesia Care Complications:            No immediate complications. Estimated Blood Loss:     Estimated blood loss was minimal. Procedure:                Pre-Anesthesia Assessment:                           - Prior to the procedure, a History and Physical                            was performed, and patient medications and                            allergies were reviewed. The patient's tolerance of                            previous anesthesia was also reviewed. The risks                            and benefits of the procedure and the sedation                            options and risks were discussed with the patient.                            All questions were answered, and informed consent  was obtained. Prior Anticoagulants: The patient has                            taken no previous anticoagulant or antiplatelet                            agents. ASA Grade Assessment: III - A patient with                            severe systemic disease. After reviewing the risks                            and benefits, the patient was deemed in      satisfactory condition to undergo the procedure.                           After obtaining informed consent, the endoscope was                            passed under direct vision. Throughout the                            procedure, the patient's blood pressure, pulse, and                            oxygen saturations were monitored continuously. The                            Endoscope was introduced through the mouth, and                            advanced to the. The upper EUS was accomplished                            without difficulty. The patient tolerated the                            procedure well. The Endoscope was introduced                            through the and advanced to the second part of                            duodenum. Scope In: Scope Out: Findings:      Endosonographic Finding :      There was no sign of significant endosonographic abnormality in the genu       of the pancreas, in the pancreatic body and in the pancreatic tail.      The pancreatic duct had a dilated endosonographic appearance in the genu       of the pancreas, in the body of the pancreas, in the tail of the       pancreas and in the main pancreatic duct. The pancreatic duct measured       up to 8 mm in diameter.  One stent was visualized endosonographically in the common bile duct.       Extension of the stent was noted in the common bile duct.      A suspected irregular mass was identified in the pancreatic head. The       mass was hypoechoic, heterogenous and characterized by shadowing. The       endosonographic borders were poorly-defined, and was difficult to assess       due to shadowing and artifact from the biliary stent. There was       extensive artifact seen from the indwelling metal biliary stent, which       precluded good views of the lesion. Fine needle aspiration of the region       immediately upstream of the pancreatic ductal dilatation for cytology       was  performed. Color Doppler imaging was utilized prior to needle       puncture to confirm a lack of significant vascular structures within the       needle path. Three passes were made with the 25 gauge needle using a       transduodenal approach. A cytotechnologist was present to evaluate the       adequacy of the specimen. Final cytology results are pending. Impression:               - There was no sign of significant pathology in the                            genu of the pancreas, in the pancreatic body and in                            the pancreatic tail.                           - The pancreatic duct had a dilated endosonographic                            appearance in the genu of the pancreas, in the body                            of the pancreas, in the tail of the pancreas and in                            the main pancreatic duct. The pancreatic duct                            measured up to 8 mm in diameter.                           - One stent was visualized endosonographically in                            the common bile duct.                           - A mass was identified in the pancreatic head.  Fine needle aspiration performed. Moderate Sedation:      None Recommendation:           - Discharge patient to home (via wheelchair).                           - Resume previous diet today.                           - Continue present medications.                           - Await cytology results and await path results.                           - If pathology results still negative, will need                            repeat ERCP with stent pull and possible                            cholangioscopy and possible retry EUS with biliary                            stent out.                           - Return to GI clinic after studies are complete.                           - Return to referring physician as previously                             scheduled. Procedure Code(s):        --- Professional ---                           [phone], Esophagogastroduodenoscopy, flexible,                            transoral; with transendoscopic ultrasound-guided                            intramural or transmural fine needle                            aspiration/biopsy(s) (includes endoscopic                            ultrasound examination of the esophagus, stomach,                            and either the duodenum or a surgically altered                            stomach where the jejunum is examined distal to the  anastomosis) Diagnosis Code(s):        --- Professional ---                           R93.3, Abnormal findings on diagnostic imaging of                            other parts of digestive tract                           K86.89, Other specified diseases of pancreas                           R94.8, Abnormal results of function studies of                            other organs and systems                           R74.8, Abnormal levels of other serum enzymes                           R93.2, Abnormal findings on diagnostic imaging of                            liver and biliary tract CPT copyright 2016 [employer]. All rights reserved. The codes documented in this report are preliminary and upon coder review may  be revised to meet current compliance requirements. Arta Silence, MD 08/29/2015 10:40:55 AM This report has been signed electronically. Number of Addenda: 0

## 2015-08-29 NOTE — Discharge Instructions (Signed)
Endoscopic Ultrasound ° °Care After °Please read the instructions outlined below and refer to this sheet in the next few weeks. These discharge instructions provide you with general information on caring for yourself after you leave the hospital. Your doctor may also give you specific instructions. While your treatment has been planned according to the most current medical practices available, unavoidable complications occasionally occur. If you have any problems or questions after discharge, please call Dr. Katelyn Kohlmeyer ([facility]) at [phone]. ° °HOME CARE INSTRUCTIONS °Activity °· You may resume your regular activity but move at a slower pace for the next 24 hours.  °· Take frequent rest periods for the next 24 hours.  °· Walking will help expel (get rid of) the air and reduce the bloated feeling in your abdomen.  °· No driving for 24 hours (because of the anesthesia (medicine) used during the test).  °· You may shower.  °· Do not sign any important legal documents or operate any machinery for 24 hours (because of the anesthesia used during the test).  °Nutrition °· Drink plenty of fluids.  °· You may resume your normal diet.  °· Begin with a light meal and progress to your normal diet.  °· Avoid alcoholic beverages for 24 hours or as instructed by your caregiver.  °Medications °You may resume your normal medications unless your caregiver tells you otherwise. °What you can expect today °· You may experience abdominal discomfort such as a feeling of fullness or "gas" pains.  °· You may experience a sore throat for 2 to 3 days. This is normal. Gargling with salt water may help this.  °·  °SEEK IMMEDIATE MEDICAL CARE IF: °· You have excessive nausea (feeling sick to your stomach) and/or vomiting.  °· You have severe abdominal pain and distention (swelling).  °· You have trouble swallowing.  °· You have a temperature over 100° F (37.8° C).  °· You have rectal bleeding or vomiting of blood.  °Document  Released: 08/02/2003 Document Revised: 08/30/2010 Document Reviewed: 02/12/2007 °ExitCare® Patient Information ©2012 [employer]. °

## 2015-08-29 NOTE — Anesthesia Preprocedure Evaluation (Addendum)
Anesthesia Evaluation  Patient identified by MRN, date of birth, ID band Patient awake    Reviewed: Allergy & Precautions, NPO status , Patient's Chart, lab work & pertinent test results  History of Anesthesia Complications (+) PONV  Airway Mallampati: II  TM Distance: >3 FB     Dental   Pulmonary neg pulmonary ROS,    breath sounds clear to auscultation       Cardiovascular hypertension,  Rhythm:Regular Rate:Normal     Neuro/Psych negative neurological ROS     GI/Hepatic Neg liver ROS, GI history noted. CE   Endo/Other  negative endocrine ROS  Renal/GU negative Renal ROS     Musculoskeletal  (+) Arthritis ,   Abdominal   Peds  Hematology   Anesthesia Other Findings   Reproductive/Obstetrics                            Anesthesia Physical Anesthesia Plan  ASA: III  Anesthesia Plan: MAC   Post-op Pain Management:    Induction: Intravenous  Airway Management Planned: Simple Face Mask  Additional Equipment:   Intra-op Plan:   Post-operative Plan: Extubation in OR  Informed Consent:   Dental advisory given  Plan Discussed with: CRNA and Anesthesiologist  Anesthesia Plan Comments:        Anesthesia Quick Evaluation

## 2015-08-29 NOTE — Anesthesia Procedure Notes (Signed)
Procedure Name: MAC Date/Time: 08/29/2015 9:52 AM Performed by: West Pugh Pre-anesthesia Checklist: Patient identified, Timeout performed, Emergency Drugs available, Suction available and Patient being monitored Patient Re-evaluated:Patient Re-evaluated prior to inductionOxygen Delivery Method: Nasal cannula Placement Confirmation: positive ETCO2 and CO2 detector Dental Injury: Teeth and Oropharynx as per pre-operative assessment

## 2015-08-29 NOTE — H&P (View-Only) (Signed)
History and Physical    Dustin Lucas [id number] DOB: [birth year] DOA: 08/18/2015  PCP: Benito Mccreedy, MD   Patient coming from: PCP's office  Chief Complaint: Abnormal LFTs, abnormal abdominal ultrasound  HPI: Dustin Lucas is a 75 y.o. gentleman with a history of HTN, localized prostate cancer (prostate biopsy done but never treated per patient; followed twice yearly by his urologist), and arthritis with history of right hip replacement who was referred for direct admission by his PCP for abnormal LFTs and abnormal abdominal ultrasound.  The patient is accompanied by his wife Shirlene.  He reports a one month history of decreased appetite, increased fatigue, and a 10 lb weight loss.  He denies associated abdominal pain, nausea, or vomiting.  He has had intermittent loose stools but he denies diarrhea (more than 2-3 stools daily).  No chest pain or shortness of breath.  He has had increased acid reflux symptoms (burning sensation in chest) and increased gas.  He had not noted scleral icterus until it was pointed out by his PCP.  He initially presented to this PCP with complaints of "dark urine".  UTI was ruled out.  He denies hematuria.  He has not had BRBPR.    Outpatient labs ultimately revealed abnormal LFTs, with a total bilirubin of 13.0, direct bilirubin on 10.6, alk phos 292, AST 228, ALT 337.  Abdominal ultrasound done on 8/11 showed gallbladder stones and sludge but no gallbladder wall thickening, pericholecystic fluid, or sonographic Murphy's sign to suggest acute cholecystitis.  Common bile duct dilated to 1.6 cm.  The patient was referred for direct admission for further evaluation and GI consultation.  Case discussed briefly with Dr. Collene Mares (on call for Dr. Benson Norway) after patient arrived.  CT abdomen/pelvis recommended.  The patient reports a shellfish allergy (tongue/throat swelling), and he is currently declining contrast administration.  He has been advised that we can pre-treat with  a combination of steroids and benadryl to reduce his risk of adverse reaction, but he is declining this option at this time.  Patient has been advised that images will be limited without contrast, and he voiced an understanding.  He wants to proceed with noncontrasted CT for now.  Wife at bedside and is in agreement.   Review of Systems: As per HPI otherwise 10 point review of systems negative.    Past Medical History:  Diagnosis Date  . Arthritis   . Cancer    prostate cancer  . Hypertension   . PONV (postoperative nausea and vomiting)   . Urinary frequency     Past Surgical History:  Procedure Laterality Date  . FRACTURE SURGERY Left    plate in ankle  . JOINT REPLACEMENT     hip replacement  right  . TONSILLECTOMY    . TOTAL HIP REVISION Right 08/09/2014   Procedure: TOTAL HIP REVISION;  Surgeon: Frederik Pear, MD;  Location: [place];  Service: Orthopedics;  Laterality: Right;  REVISION (ASR) RIGHT TOTAL HIP ARHROPLASTY  **INNOMET OSTEOTONES   SOCIAL HISTORY: He denies any history of tobacco use.  Had an occasional alcoholic beverage in the past, but no significant history of EtOH use.  No illicit drug use.  He is married.  He has two adult children.  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   Allergies  Allergen Reactions  . Shellfish Allergy Swelling   FAMILY HISTORY: He denies history of GI malignancy including esophageal, stomach, pancreatic, or colon cancers.  Prior to Admission medications  Medication Sig Start Date End Date Taking? Authorizing Provider  amLODipine-valsartan (EXFORGE) 5-320 MG per tablet Take 1 tablet by mouth daily. 07/20/14  Yes Historical Provider, MD  aspirin 81 MG chewable tablet Chew by mouth daily.   Yes Historical Provider, MD  Cholecalciferol (VITAMIN D-3) 1000 UNITS CAPS Take 2,000 Units by mouth daily.    Yes Historical Provider, MD  dutasteride (AVODART) 0.5 MG capsule Take 0.5 mg by mouth daily. 07/12/14  Yes  Historical Provider, MD  folic acid (FOLVITE) 497 MCG tablet Take 400 mcg by mouth daily.   Yes Historical Provider, MD    Physical Exam: Vitals:   08/18/15 2015  BP: (!) 160/83  Pulse: 69  Resp: 18  Temp: 99.3 F (37.4 C)  TempSrc: Oral  SpO2: 100%  Weight: 97.1 kg (214 lb 1.1 oz)  Height: 6' 3"  (1.905 m)      Constitutional: NAD, calm, comfortable Eyes: PERRL, + scleral icterus ENMT: Mucous membranes are moist. Posterior pharynx clear of any exudate or lesions. Normal dentition.  Neck: normal appearance, supple, no masses Respiratory: clear to auscultation bilaterally, no wheezing, no crackles. Normal respiratory effort. No accessory muscle use.  Cardiovascular: Normal rate, regular rhythm, no murmurs / rubs / gallops. No extremity edema. 2+ pedal pulses.  GI: abdomen is soft and compressible.  No distention.  No tenderness.  No masses palpated.  Bowel sounds are present.  NO Murphy's sign elicited on physical exam. Musculoskeletal:  No joint deformity in upper and lower extremities. Good ROM, no contractures. Normal muscle tone.  Skin: no rashes, warm and dry Neurologic: CN 2-12 grossly intact. Sensation intact, Strength symmetric bilaterally, 5/5  Psychiatric: Normal judgment and insight. Alert and oriented x 3. Normal mood.     Labs on Admission: I have personally reviewed following labs and imaging studies  CBC:  Recent Labs Lab 08/18/15 1949  WBC 5.0  NEUTROABS 2.9  HGB 13.6  HCT 38.2*  MCV 90.1  PLT 026   Basic Metabolic Panel:  Recent Labs Lab 08/18/15 1949  NA 140  K 3.3*  CL 106  CO2 26  GLUCOSE 97  BUN 11  CREATININE 0.75  CALCIUM 9.0  MG 2.0   GFR: CrCl cannot be calculated (Unknown ideal weight.). Liver Function Tests:  Recent Labs Lab 08/18/15 1949  AST 179*  ALT 209*  ALKPHOS 244*  BILITOT 15.3*  PROT 6.9  ALBUMIN 3.1*   Coagulation Profile:  Recent Labs Lab 08/18/15 1949  INR 1.17    Radiological Exams on  Admission: CT abdomen and pelvis without contrast pending.   Assessment/Plan Active Problems:   Jaundice   Abnormal LFTs   Weight loss      Painless jaundice/scleral icterus with abnormal LFTs, weight loss, and dilated common bile duct on abdominal ultrasound --GI consult by Dr. Benson Norway anticipated for the morning --CT abdomen and pelvis without contrast, per patient wishes/as documented above, tonight --Does not appear to have acute cholecystitis at this point, so general surgery consultation has been deferred for now --High index of suspicion for occult malignancy based on history.  Defer additional work-up for abnormal LFTs to GI attending for now. --Clear liquid diet for now, NPO after midnight --HOLDING aspirin in anticipation of invasive procedure --Normal INR noted  HTN --Continue amlodipine/valsartan for now  History of prostate cancer --Continue home dose of avodart for now  Mild hypokalemia --Replacement ordered, repeat BMP in the AM  DVT prophylaxis: SCDs Code Status: FULL CODE Family Communication: Wife Shirlene at bedside  at time of admission Disposition Plan: To be determined Consults called: GI: Dr. Adriana Mccallum; Dr. Benson Norway should see in the AM Admission status: Observation, med surg   TIME SPENT: 75 minutes   Eber Jones MD [facility] Pager [phone]  If 7PM-7AM, please contact night-coverage [email or web address] Password [email or web address]  08/18/2015, 8:14 PM

## 2015-08-30 LAB — POCT I-STAT 4, (NA,K, GLUC, HGB,HCT)
GLUCOSE: 84 mg/dL (ref 65–99)
Glucose, Bld: 93 mg/dL (ref 65–99)
HCT: 46 % (ref 39.0–52.0)
HEMATOCRIT: 41 % (ref 39.0–52.0)
HEMOGLOBIN: 13.9 g/dL (ref 13.0–17.0)
Hemoglobin: 15.6 g/dL (ref 13.0–17.0)
POTASSIUM: 7.7 mmol/L — AB (ref 3.5–5.1)
Potassium: 5.7 mmol/L — ABNORMAL HIGH (ref 3.5–5.1)
SODIUM: 141 mmol/L (ref 135–145)
Sodium: 140 mmol/L (ref 135–145)

## 2015-08-31 ENCOUNTER — Encounter (HOSPITAL_COMMUNITY): Payer: Self-pay | Admitting: Gastroenterology

## 2015-09-19 NOTE — Addendum Note (Signed)
Addendum  created 09/19/15 2320 by Finis Bud, MD   Anesthesia Attestations filed, Sign clinical note

## 2015-09-28 ENCOUNTER — Other Ambulatory Visit: Payer: Self-pay | Admitting: Gastroenterology

## 2015-09-30 ENCOUNTER — Other Ambulatory Visit: Payer: Self-pay | Admitting: Gastroenterology

## 2015-10-03 ENCOUNTER — Encounter (HOSPITAL_COMMUNITY): Payer: Self-pay | Admitting: *Deleted

## 2015-10-05 ENCOUNTER — Ambulatory Visit (HOSPITAL_COMMUNITY): Payer: Medicare Other | Admitting: Certified Registered"

## 2015-10-05 ENCOUNTER — Ambulatory Visit (HOSPITAL_COMMUNITY)
Admission: RE | Admit: 2015-10-05 | Discharge: 2015-10-05 | Disposition: A | Discharge status: Home / Self Care | Payer: Medicare Other | Source: Ambulatory Visit | Attending: Gastroenterology | Admitting: Gastroenterology

## 2015-10-05 ENCOUNTER — Ambulatory Visit (HOSPITAL_COMMUNITY): Payer: Medicare Other

## 2015-10-05 ENCOUNTER — Encounter (HOSPITAL_COMMUNITY): Payer: Self-pay | Admitting: *Deleted

## 2015-10-05 ENCOUNTER — Encounter (HOSPITAL_COMMUNITY)
Admission: RE | Disposition: A | Discharge status: Home / Self Care | Payer: Self-pay | Source: Ambulatory Visit | Attending: Gastroenterology

## 2015-10-05 DIAGNOSIS — R748 Abnormal levels of other serum enzymes: Secondary | ICD-10-CM | POA: Diagnosis not present

## 2015-10-05 DIAGNOSIS — I10 Essential (primary) hypertension: Secondary | ICD-10-CM | POA: Diagnosis not present

## 2015-10-05 DIAGNOSIS — K8689 Other specified diseases of pancreas: Secondary | ICD-10-CM | POA: Insufficient documentation

## 2015-10-05 DIAGNOSIS — R933 Abnormal findings on diagnostic imaging of other parts of digestive tract: Secondary | ICD-10-CM | POA: Insufficient documentation

## 2015-10-05 DIAGNOSIS — K831 Obstruction of bile duct: Secondary | ICD-10-CM | POA: Diagnosis not present

## 2015-10-05 DIAGNOSIS — Z91013 Allergy to seafood: Secondary | ICD-10-CM | POA: Insufficient documentation

## 2015-10-05 HISTORY — PX: EUS: SHX5427

## 2015-10-05 HISTORY — PX: ERCP: SHX5425

## 2015-10-05 SURGERY — UPPER ENDOSCOPIC ULTRASOUND (EUS) RADIAL
Anesthesia: General

## 2015-10-05 MED ORDER — INDOMETHACIN 50 MG RE SUPP
RECTAL | Status: AC
  Filled 2015-10-05: qty 2

## 2015-10-05 MED ORDER — INDOMETHACIN 50 MG RE SUPP
100.0000 mg | Freq: Once | RECTAL | Status: AC
  Administered 2015-10-05: 100 mg via RECTAL

## 2015-10-05 MED ORDER — ONDANSETRON HCL 4 MG/2ML IJ SOLN
INTRAMUSCULAR | Status: DC | PRN
  Administered 2015-10-05: 4 mg via INTRAVENOUS

## 2015-10-05 MED ORDER — PHENYLEPHRINE 40 MCG/ML (10ML) SYRINGE FOR IV PUSH (FOR BLOOD PRESSURE SUPPORT)
PREFILLED_SYRINGE | INTRAVENOUS | Status: AC
  Filled 2015-10-05: qty 10

## 2015-10-05 MED ORDER — IOPAMIDOL (ISOVUE-300) INJECTION 61%
INTRAVENOUS | Status: DC | PRN
  Administered 2015-10-05: 100 mL

## 2015-10-05 MED ORDER — LACTATED RINGERS IV SOLN
INTRAVENOUS | Status: DC
  Administered 2015-10-05: 1000 mL via INTRAVENOUS

## 2015-10-05 MED ORDER — PHENYLEPHRINE 40 MCG/ML (10ML) SYRINGE FOR IV PUSH (FOR BLOOD PRESSURE SUPPORT)
PREFILLED_SYRINGE | INTRAVENOUS | Status: DC | PRN
  Administered 2015-10-05 (×6): 80 ug via INTRAVENOUS

## 2015-10-05 MED ORDER — FENTANYL CITRATE (PF) 100 MCG/2ML IJ SOLN
INTRAMUSCULAR | Status: AC
  Filled 2015-10-05: qty 2

## 2015-10-05 MED ORDER — LIDOCAINE 2% (20 MG/ML) 5 ML SYRINGE
INTRAMUSCULAR | Status: AC
  Filled 2015-10-05: qty 5

## 2015-10-05 MED ORDER — FENTANYL CITRATE (PF) 100 MCG/2ML IJ SOLN
INTRAMUSCULAR | Status: DC | PRN
  Administered 2015-10-05 (×2): 50 ug via INTRAVENOUS

## 2015-10-05 MED ORDER — SUCCINYLCHOLINE CHLORIDE 20 MG/ML IJ SOLN
INTRAMUSCULAR | Status: AC
  Filled 2015-10-05: qty 1

## 2015-10-05 MED ORDER — PROPOFOL 10 MG/ML IV BOLUS
INTRAVENOUS | Status: AC
  Filled 2015-10-05: qty 20

## 2015-10-05 MED ORDER — GLYCOPYRROLATE 0.2 MG/ML IV SOSY
PREFILLED_SYRINGE | INTRAVENOUS | Status: DC | PRN
  Administered 2015-10-05: .2 mg via INTRAVENOUS

## 2015-10-05 MED ORDER — ONDANSETRON HCL 4 MG/2ML IJ SOLN
INTRAMUSCULAR | Status: AC
  Filled 2015-10-05: qty 2

## 2015-10-05 MED ORDER — CIPROFLOXACIN IN D5W 400 MG/200ML IV SOLN
INTRAVENOUS | Status: DC | PRN
  Administered 2015-10-05: 400 mg via INTRAVENOUS

## 2015-10-05 MED ORDER — PROPOFOL 10 MG/ML IV BOLUS
INTRAVENOUS | Status: DC | PRN
  Administered 2015-10-05: 150 mg via INTRAVENOUS
  Administered 2015-10-05: 50 mg via INTRAVENOUS

## 2015-10-05 MED ORDER — SODIUM CHLORIDE 0.9 % IV SOLN: INTRAVENOUS | Status: DC

## 2015-10-05 MED ORDER — LIDOCAINE 2% (20 MG/ML) 5 ML SYRINGE
INTRAMUSCULAR | Status: DC | PRN
  Administered 2015-10-05: 30 mg via INTRAVENOUS

## 2015-10-05 MED ORDER — GLYCOPYRROLATE 0.2 MG/ML IV SOSY
PREFILLED_SYRINGE | INTRAVENOUS | Status: AC
  Filled 2015-10-05: qty 3

## 2015-10-05 MED ORDER — CIPROFLOXACIN IN D5W 400 MG/200ML IV SOLN
INTRAVENOUS | Status: AC
  Filled 2015-10-05: qty 200

## 2015-10-05 MED ORDER — SUCCINYLCHOLINE CHLORIDE 200 MG/10ML IV SOSY
PREFILLED_SYRINGE | INTRAVENOUS | Status: DC | PRN
  Administered 2015-10-05: 120 mg via INTRAVENOUS

## 2015-10-05 NOTE — Op Note (Signed)
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 Estimated Blood Loss:     Estimated blood loss was minimal. Procedure:                Pre-Anesthesia Assessment:                           - Prior to the procedure, a History and Physical                            was performed, and patient medications and                            allergies were reviewed. The patient's tolerance of                            previous anesthesia was also reviewed. The risks                            and benefits of the procedure and the sedation                            options and risks were discussed with the patient.                            All questions were answered, and informed consent                            was obtained. Prior Anticoagulants: The patient has                            taken no previous anticoagulant or antiplatelet  agents. ASA Grade Assessment: II - A patient with                            mild systemic disease. After reviewing the risks                            and benefits, the patient was deemed in                            satisfactory condition to undergo the procedure.                           After obtaining informed consent, the endoscope was                             passed under direct vision. Throughout the                            procedure, the patient's blood pressure, pulse, and                            oxygen saturations were monitored continuously. The                            [id number]) scope was introduced through                            the mouth, and advanced to the second part of                            duodenum. The upper EUS was accomplished without                            difficulty. The patient tolerated the procedure                            well. Scope In: Scope Out: Findings:      Endosonographic Finding :      One stent was visualized endosonographically in the common bile duct.       Extension of the stent was noted in the common bile duct. A biliary       wallstent was removed.      There was dilation in the common bile duct which measured up to 10 mm.      An irregular mass was identified in the pancreatic head. The mass was       hypoechoic. The mass measured 30 mm by 30 mm in maximal cross-sectional       diameter. The endosonographic borders were poorly-defined. An intact       interface was seen between the mass and the portal vein and SMV       suggesting a lack of venous invasion. No peripancreatic adenopathy was       seen. Fine needle aspiration for cytology was performed. Color Doppler       imaging was utilized prior to needle puncture to confirm a lack of  significant vascular structures within the needle path. Four passes were       made with the 25 gauge needle using a transduodenal approach. A stylet       was used. A cytotechnologist was present to evaluate the adequacy of the       specimen. Final cytology results are pending. Impression:               - One stent was visualized endosonographically in                            the common bile duct.                           - There was dilation in the common bile duct which                            measured up to 10 mm.                            - A mass was identified in the pancreatic head.                            Fine needle aspiration performed.                           - Foreign body removal, biliary wallstent, sent for                            cytology. Moderate Sedation:      None Recommendation:           - Await cytology results (pancreas FNA and biliary                            wallstent).                           - Perform an ERCP today. Procedure Code(s):        --- Professional ---                           [phone], Esophagogastroduodenoscopy, flexible,                            transoral; with transendoscopic ultrasound-guided                            intramural or transmural fine needle                            aspiration/biopsy(s) (includes endoscopic                            ultrasound examination of the esophagus, stomach,                            and either the duodenum or a surgically altered  stomach where the jejunum is examined distal to the                            anastomosis)                           43247, Esophagogastroduodenoscopy, flexible,                            transoral; with removal of foreign body(s) Diagnosis Code(s):        --- Professional ---                           K86.89, Other specified diseases of pancreas                           R74.8, Abnormal levels of other serum enzymes                           R79.9, Abnormal finding of blood chemistry,                            unspecified                           K83.8, Other specified diseases of biliary tract                           R93.3, Abnormal findings on diagnostic imaging of                            other parts of digestive tract CPT copyright 2016 American Medical Association. All rights reserved. The codes documented in this report are preliminary and upon coder review may  be revised to meet current compliance requirements. Arta Silence,  MD 10/05/2015 1:50:54 PM This report has been signed electronically. Number of Addenda: 0

## 2015-10-05 NOTE — Anesthesia Postprocedure Evaluation (Signed)
Anesthesia Post Note  Patient: Onyekachi Slee  Procedure(s) Performed: Procedure(s) (LRB): UPPER ENDOSCOPIC ULTRASOUND (EUS) RADIAL (N/A) ENDOSCOPIC RETROGRADE CHOLANGIOPANCREATOGRAPHY (ERCP) (N/A)  Patient location during evaluation: Endoscopy Anesthesia Type: General Level of consciousness: awake and alert Pain management: pain level controlled Vital Signs Assessment: post-procedure vital signs reviewed and stable Respiratory status: spontaneous breathing, nonlabored ventilation, respiratory function stable and patient connected to nasal cannula oxygen Cardiovascular status: blood pressure returned to baseline and stable Postop Assessment: no signs of nausea or vomiting Anesthetic complications: no    Last Vitals:  Vitals:   10/05/15 1510 10/05/15 1520  BP: (!) 164/104 (!) 167/92  Pulse: 77 77  Resp: 19 19  Temp:      Last Pain:  Vitals:   10/05/15 1442  TempSrc: Oral                 Montez Hageman

## 2015-10-05 NOTE — Anesthesia Procedure Notes (Signed)
Procedure Name: Intubation Date/Time: 10/05/2015 1:04 PM Performed by: Lajuana Carry E Pre-anesthesia Checklist: Patient identified, Emergency Drugs available, Suction available and Patient being monitored Patient Re-evaluated:Patient Re-evaluated prior to inductionOxygen Delivery Method: Circle system utilized Preoxygenation: Pre-oxygenation with 100% oxygen Intubation Type: IV induction Ventilation: Mask ventilation without difficulty Laryngoscope Size: Mac and 4 Grade View: Grade I Tube type: Oral Tube size: 7.5 mm Number of attempts: 1 Airway Equipment and Method: Stylet and Oral airway Placement Confirmation: ETT inserted through vocal cords under direct vision,  positive ETCO2 and breath sounds checked- equal and bilateral Secured at: 20 cm Tube secured with: Tape Dental Injury: Teeth and Oropharynx as per pre-operative assessment

## 2015-10-05 NOTE — Op Note (Signed)
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 Estimated Blood Loss:     Estimated blood loss: none. Procedure:                Pre-Anesthesia Assessment:                           - Prior to the procedure, a History and Physical                            was performed, and patient medications and                            allergies were reviewed. The patient's tolerance of                            previous anesthesia was also reviewed. The risks                            and benefits of the procedure and the sedation                            options and risks were discussed with the patient.                            All questions were answered, and informed consent                            was obtained. Prior Anticoagulants: The patient has                            taken no previous anticoagulant or antiplatelet                            agents. ASA Grade Assessment: II - A patient with                            mild systemic  disease. After reviewing the risks                            and benefits, the patient was deemed in                            satisfactory condition to undergo the procedure.                           After obtaining informed consent, the scope was                            passed under direct vision. Throughout the                            procedure, the patient's blood pressure, pulse, and                           oxygen saturations were monitored continuously. The                            [id number]) scope was introduced through                            the mouth, and used to inject contrast into and                            used to locate the major papilla. The ERCP was                            accomplished without difficulty. The patient                            tolerated the procedure well. Scope In: Scope Out: Findings:      A biliary sphincterotomy had been performed previously. The       sphincterotomy appeared open.deep selective cannulation was easily       obtained on the first attempt using the [place] wire and the       sphincterotome To discover objects, the biliary tree was swept with a 12       mm balloon starting at the lower third of the main duct which also       confirmed the CBD stricture when we proceeded with an occlusion       cholangiogram. Nothing was found on balloon slow withdrawal. One 10 Fr       by 4 cm covered metal stent was placed into the distal common bile duct.       Bile flowed through the stent. The stent was in good position. there       were no pancreatic duct injection or wire advancement throughout the       procedure Impression:               - Prior biliary endoscopic sphincterotomy appeared  open.                           - The biliary tree was swept and nothing was found.                           - One covered metal stent was placed into the                            distal common bile duct with adequate biliary                            drainage. Moderate Sedation:      N/A- Per Anesthesia Care Recommendation:           - Avoid aspirin and nonsteroidal anti-inflammatory                            medicines for 3 days.                           - Continue present medications.                           - Await path results.                           - Telephone GI clinic for  pathology results in 3                            days.                           - Telephone GI clinic if symptomatic PRN.                           - Return to GI clinic PRN.                           - Refer to a surgeon at appointment to be scheduled. Procedure Code(s):        --- Professional ---                           [phone], Esophagogastroduodenoscopy, flexible,                            transoral; diagnostic, including collection of                            specimen(s) by brushing or washing, when performed                            (separate procedure) Diagnosis Code(s):        --- Professional ---                           R93.5, Abnormal findings on diagnostic imaging of  other abdominal regions, including retroperitoneum CPT copyright 2016 American Medical Association. All rights reserved. The codes documented in this report are preliminary and upon coder review may  be revised to meet current compliance requirements. Clarene Essex, MD 10/05/2015 2:46:54 PM This report has been signed electronically. Number of Addenda: 0

## 2015-10-05 NOTE — Discharge Instructions (Signed)
Clear liquids only until 6 PM and then if doing well may have soft solids tonight and call if question or problem otherwise call in 3 days for biopsy report and to discuss probable surgical consultation specifically which city would you like to see a surgeon in  [place]: Refer to the procedure report and other information in the discharge instructions given to you for any specific questions about what was found during the examination. If this information does not answer your questions, please call [facility] office at [phone] to clarify.   YOU SHOULD EXPECT: Some feelings of bloating in the abdomen. Passage of more gas than usual. Walking can help get rid of the air that was put into your GI tract during the procedure and reduce the bloating. If you had a lower endoscopy (such as a colonoscopy or flexible sigmoidoscopy) you may notice spotting of blood in your stool or on the toilet paper. Some abdominal soreness may be present for a day or two, also.  DIET: Your first meal following the procedure should be a light meal and then it is ok to progress to your normal diet. A half-sandwich or bowl of soup is an example of a good first meal. Heavy or fried foods are harder to digest and may make you feel nauseous or bloated. Drink plenty of fluids but you should avoid alcoholic beverages for 24 hours. If you had a esophageal dilation, please see attached instructions for diet.   ACTIVITY: Your care partner should take you home directly after the procedure. You should plan to take it easy, moving slowly for the rest of the day. You can resume normal activity the day after the procedure however YOU SHOULD NOT DRIVE, use power tools, machinery or perform tasks that involve climbing or major physical exertion for 24 hours (because of the sedation medicines used during the test).   SYMPTOMS TO REPORT IMMEDIATELY: A gastroenterologist can be reached at any hour. Please call  [phone]  for any of the following symptoms:  Following lower endoscopy (colonoscopy, flexible sigmoidoscopy) Excessive amounts of blood in the stool  Significant tenderness, worsening of abdominal pains  Swelling of the abdomen that is new, acute  Fever of 100 or higher  Following upper endoscopy (EGD, EUS, ERCP, esophageal dilation) Vomiting of blood or coffee ground material  New, significant abdominal pain  New, significant chest pain or pain under the shoulder blades  Painful or persistently difficult swallowing  New shortness of breath  Black, tarry-looking or red, bloody stools  FOLLOW UP:  If any biopsies were taken you will be contacted by phone or by letter within the next 1-3 weeks. Call [phone]  if you have not heard about the biopsies in 3 weeks.  Please also call with any specific questions about appointments or follow up tests.

## 2015-10-05 NOTE — Transfer of Care (Signed)
Immediate Anesthesia Transfer of Care Note  Patient: Dustin Lucas  Procedure(s) Performed: Procedure(s) with comments: UPPER ENDOSCOPIC ULTRASOUND (EUS) RADIAL (N/A) ENDOSCOPIC RETROGRADE CHOLANGIOPANCREATOGRAPHY (ERCP) (N/A) - Dr. Watt Climes to do ERCP  Patient Location: PACU  Anesthesia Type:General  Level of Consciousness: awake, alert  and oriented  Airway & Oxygen Therapy: Patient Spontanous Breathing and Patient connected to face mask oxygen  Post-op Assessment: Report given to RN and Post -op Vital signs reviewed and stable  Post vital signs: Reviewed and stable  Last Vitals:  Vitals:   10/05/15 1225  BP: (!) 167/81  Pulse: 80  Resp: 14  Temp: 36.6 C    Last Pain:  Vitals:   10/05/15 1225  TempSrc: Oral         Complications: No apparent anesthesia complications

## 2015-10-05 NOTE — H&P (Signed)
Patient interval history reviewed.  Patient examined again.  There has been no change from documented H/P dated 09/19/15 (scanned into chart from our office) except as documented above.  Assessment:  1.  Pancreatic mass. 2.  Obstructive jaundice.  Plan:  1.  Endoscopic ultrasound, after removal of biliary stent, with possible fine needle aspiration biopsies of the pancreas. 2.  Risks (bleeding, infection, bowel perforation that could require surgery, sedation-related changes in cardiopulmonary systems), benefits (identification and possible treatment of source of symptoms, exclusion of certain causes of symptoms), and alternatives (watchful waiting, radiographic imaging studies, empiric medical treatment) of upper endoscopy with ultrasound and possible fine need aspiration biopsies (EUS +/- FNA) were explained to patient/family in detail and patient wishes to proceed. 3.  Endoscopic retrograde cholangiopancreatography with anticipated bile duct stent removal and replacement. 4.  Risks (up to and including bleeding, infection, perforation, pancreatitis that can be complicated by infected necrosis and death), benefits (removal of stones, alleviating blockage, decreasing risk of cholangitis or choledocholithiasis-related pancreatitis), and alternatives (watchful waiting, percutaneous transhepatic cholangiography) of ERCP were explained to patient/family in detail and patient elects to proceed.

## 2015-10-05 NOTE — Anesthesia Preprocedure Evaluation (Signed)
Anesthesia Evaluation  Patient identified by MRN, date of birth, ID band Patient awake    Reviewed: Allergy & Precautions, NPO status , Patient's Chart, lab work & pertinent test results  History of Anesthesia Complications (+) PONV  Airway Mallampati: II  TM Distance: >3 FB Neck ROM: Full    Dental no notable dental hx.    Pulmonary neg pulmonary ROS,    Pulmonary exam normal breath sounds clear to auscultation       Cardiovascular hypertension, Pt. on medications Normal cardiovascular exam Rhythm:Regular Rate:Normal     Neuro/Psych negative neurological ROS  negative psych ROS   GI/Hepatic negative GI ROS, Neg liver ROS,   Endo/Other  negative endocrine ROS  Renal/GU negative Renal ROS  negative genitourinary   Musculoskeletal negative musculoskeletal ROS (+)   Abdominal   Peds negative pediatric ROS (+)  Hematology negative hematology ROS (+)   Anesthesia Other Findings   Reproductive/Obstetrics negative OB ROS                             Anesthesia Physical Anesthesia Plan  ASA: II  Anesthesia Plan: General   Post-op Pain Management:    Induction: Intravenous  Airway Management Planned: Oral ETT  Additional Equipment:   Intra-op Plan:   Post-operative Plan: Extubation in OR  Informed Consent: I have reviewed the patients History and Physical, chart, labs and discussed the procedure including the risks, benefits and alternatives for the proposed anesthesia with the patient or authorized representative who has indicated his/her understanding and acceptance.   Dental advisory given  Plan Discussed with: CRNA  Anesthesia Plan Comments:         Anesthesia Quick Evaluation

## 2015-10-06 ENCOUNTER — Encounter (HOSPITAL_COMMUNITY): Payer: Self-pay | Admitting: Gastroenterology

## 2015-10-12 ENCOUNTER — Encounter: Payer: Self-pay | Admitting: *Deleted

## 2015-10-12 ENCOUNTER — Telehealth: Payer: Self-pay | Admitting: *Deleted

## 2015-10-12 DIAGNOSIS — C250 Malignant neoplasm of head of pancreas: Secondary | ICD-10-CM | POA: Insufficient documentation

## 2015-10-12 NOTE — Telephone Encounter (Signed)
Oncology Nurse Navigator Documentation  Oncology Nurse Navigator Flowsheets 10/12/2015  Navigator Location [facility]  Navigator Encounter Type Introductory phone call  Abnormal Finding Date 08/18/2015  Confirmed Diagnosis Date 10/05/2015  Spoke with patient and provided new patient appointment for 10/18/15 at 2 pm with Dr. Benay Spice (arrive at 1:45). Informed of location of [facility], valet service, and registration process. Reminded to bring photo ID, insurance cards and a current medication list, including supplements. Patient verbalizes understanding. Notified HIM to enter appointment into EPIC.

## 2015-10-18 ENCOUNTER — Ambulatory Visit: Payer: Medicare Other | Admitting: Oncology

## 2015-11-03 ENCOUNTER — Ambulatory Visit (HOSPITAL_BASED_OUTPATIENT_CLINIC_OR_DEPARTMENT_OTHER): Payer: Medicare Other | Admitting: Oncology

## 2015-11-03 ENCOUNTER — Encounter: Payer: Self-pay | Admitting: *Deleted

## 2015-11-03 VITALS — BP 167/81 | HR 78 | Temp 98.8°F | Resp 20 | Ht 75.0 in | Wt 209.6 lb

## 2015-11-03 DIAGNOSIS — C250 Malignant neoplasm of head of pancreas: Secondary | ICD-10-CM

## 2015-11-03 DIAGNOSIS — R17 Unspecified jaundice: Secondary | ICD-10-CM

## 2015-11-03 DIAGNOSIS — I10 Essential (primary) hypertension: Secondary | ICD-10-CM

## 2015-11-03 NOTE — Progress Notes (Signed)
Oncology Nurse Navigator Documentation  Oncology Nurse Navigator Flowsheets 11/03/2015  Navigator Location [facility]  Referral date to RadOnc/MedOnc 10/12/2015  Navigator Encounter Type Initial MedOnc  Abnormal Finding Date 08/18/2015  Confirmed Diagnosis Date 10/05/2015  Patient Visit Type MedOnc;Initial  Treatment Phase Pre-Tx/Tx Discussion  Barriers/Navigation Needs Coordination of Care;Education  Education Newly Diagnosed Cancer Education;Understanding Cancer/ Treatment Options  Interventions Coordination of Care--notified Kim in HIM to work on referral and call patient when he can pick up his CD from radioloty;Education  Education Method Verbal;Written  Support Groups/Services GI Support Group;Other--[facility] Support Services  Acuity Level 2  Time Spent with Patient 60  Met with patient  during new patient visit. Explained the role of the GI Nurse Navigator and provided New Patient Packet with information on: 1. 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 Support groups 3. Advanced Directives 4. Fall Safety Plan Answered questions, reviewed current treatment plan using TEACH back and provided emotional support. Provided copy of current treatment plan. He has requested to be seen by surgeon at [facility] per his wife's request. Will follow up with HIM to get this done quickly-requesting Dr. Laddie Aquas (pancreas specialist). He was also asked to consider the SWOG S1505 trial. Patient reports he prefers surgery to be the first intervention unless the surgeon at [facility] does not agree.  Dustin Lucas is married to his wife, Dustin Lucas (2nd marriage). They have no children together. He has one living brother. He ambulates w/walker in office and uses cane in home. 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

## 2015-11-03 NOTE — Patient Instructions (Signed)
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 Laddie Aquas Consider SWOG clinical trial with neoadjuvant chemotherapy and adjuvant chemotherapy  Return to Dr. Luberta Robertson, NP 11/17/15 at 2:45.

## 2015-11-03 NOTE — Progress Notes (Signed)
[place] Patient Consult   Referring MD: Rolando Abler 75 y.o.  09-28-1940    Reason for Referral: Pancreas cancer   HPI: He developed jaundice in August and saw his primary physician. The liver enzymes and bilirubin were elevated. He was admitted and a CT of the abdomen/pelvis on 08/18/2015 revealed marked dilatation of the common bile duct with diffuse distention of the gallbladder. Stones were seen in the gallbladder. Diffuse intrahepatic ductal dilatation. The pancreas duct was also dilated. Vague nonspecific fullness was noted in the head of the pancreas. Dr. Benson Norway was consulted and he was taken to an ERCP Procedure on 08/20/2015. A biliary sphincterotomy was performed. Dr. Watt Climes performed a repeat ERCP on 08/21/2015. A repeat sphincterotomy was performed and a covered metal stent was placed. Bile duct brushings were nondiagnostic. He was referred to Dr. Paulita Fujita and underwent an EUS procedure a mass was identified and pancreas head. The mass was difficult to define secondary to artifact from the metal stent. A fine-needle aspiration biopsy was performed. The cytology was nondiagnostic.  He was taken to a repeat EUS procedure by Dr. Paulita Fujita on 10/05/2015. The stent was removed. A mass was identified in the pancreas head. The mass measured 30 mm x 30 mm. There was lack of venous invasion. No peripancreatic adenopathy. A fine-needle aspiration was performed. A metal stent was placed into the common bile duct.  The cytology [id number]) sees confirmed malignant cells consistent with adenocarcinoma.  Mr. Rende reports resolution of the jaundice and dark urine. He is referred for oncology evaluation.  Past Medical History:  Diagnosis Date  . Arthritis   . Cancer ([place]) 7-8 yrs ago   prostate cancer, monitoring psa levels q 6 months  . Hypertension   . PONV (postoperative nausea and vomiting)    in past , none recent  . Urinary frequency     Past  Surgical History:  Procedure Laterality Date  . ERCP N/A 08/21/2015   Procedure: ENDOSCOPIC RETROGRADE CHOLANGIOPANCREATOGRAPHY (ERCP);  Surgeon: Clarene Essex, MD;  Location: [facility];  Service: Endoscopy;  Laterality: N/A;  . ERCP N/A 08/20/2015   Procedure: ENDOSCOPIC RETROGRADE CHOLANGIOPANCREATOGRAPHY (ERCP);  Surgeon: Carol Ada, MD;  Location: [facility];  Service: Endoscopy;  Laterality: N/A;  . ERCP N/A 10/05/2015   Procedure: ENDOSCOPIC RETROGRADE CHOLANGIOPANCREATOGRAPHY (ERCP);  Surgeon: Arta Silence, MD;  Location: [facility];  Service: Endoscopy;  Laterality: N/A;  Dr. Watt Climes to do ERCP  . ESOPHAGOGASTRODUODENOSCOPY (EGD) WITH PROPOFOL N/A 08/29/2015   Procedure: ESOPHAGOGASTRODUODENOSCOPY (EGD) WITH PROPOFOL;  Surgeon: Arta Silence, MD;  Location: [facility];  Service: Endoscopy;  Laterality: N/A;  . EUS N/A 10/05/2015   Procedure: UPPER ENDOSCOPIC ULTRASOUND (EUS) RADIAL;  Surgeon: Arta Silence, MD;  Location: [facility];  Service: Endoscopy;  Laterality: N/A;  . FRACTURE SURGERY Left    plate in ankle  . JOINT REPLACEMENT     hip replacement  right  . TONSILLECTOMY    . TOTAL HIP REVISION Right 08/09/2014   Procedure: TOTAL HIP REVISION;  Surgeon: Frederik Pear, MD;  Location: [place];  Service: Orthopedics;  Laterality: Right;  REVISION (ASR) RIGHT TOTAL HIP ARHROPLASTY**INNOMET OSTEOTONES  . UPPER ESOPHAGEAL ENDOSCOPIC ULTRASOUND (EUS) N/A 08/29/2015   Procedure: UPPER ESOPHAGEAL ENDOSCOPIC ULTRASOUND (EUS);  Surgeon: Arta Silence, MD;  Location: [facility];  Service: Endoscopy;  Laterality: N/A;    Medications: Reviewed  Allergies:  Allergies  Allergen Reactions  . Shellfish Allergy Itching    Family history: He had  3 brothers. No family history of cancer.  Social History:   He is a retired Contractor. He lives with his wife in [place]. He does not use tobacco or alcohol. He thinks he may have had a red cell transfusion with a hip  replacement procedure. No risk factor for HIV or hepatitis.     ROS:   Positives include: Dark urine prior to placement of the bile duct stent, urinary frequency, chronic pain at the right hip, chronic mild weakness of the right leg, 10 pound weight loss  A complete ROS was otherwise negative.  Physical Exam:  Blood pressure (!) 167/81, pulse 78, temperature 98.8 F (37.1 C), temperature source Oral, resp. rate 20, height 6\' 3"  (1.905 m), weight 209 lb 9.6 oz (95.1 kg), SpO2 100 %.  HEENT: Upper and lower denture plate, oropharynx without visible mass, neck without mass Lungs: Clear bilaterally Cardiac: Regular rate and rhythm Abdomen: No hepatosplenomegaly, no mass, no apparent ascites GU: Testes without mass  Vascular: No leg edema Lymph nodes: No cervical, supraclavicular, axillary, or inguinal nodes Neurologic: Alert and oriented, the motor exam appears intact in the upper and lower extremities bilaterally, there is 4+/5 strength at the right foot and leg compared to the left side Skin: No rash Musculoskeletal: No spine tenderness   LAB:  CBC  Lab Results  Component Value Date   WBC 4.9 08/22/2015   HGB 13.9 08/29/2015   HCT 41.0 08/29/2015   MCV 91.9 08/22/2015   PLT 151 08/22/2015   NEUTROABS 3.1 08/21/2015     CMP      Component Value Date/Time   NA 140 08/29/2015 0915   K 5.7 (H) 08/29/2015 0915   CL 109 08/22/2015 0436   CO2 25 08/22/2015 0436   GLUCOSE 84 08/29/2015 0915   BUN 7 08/22/2015 0436   CREATININE 0.59 (L) 08/22/2015 0436   CALCIUM 8.7 (L) 08/22/2015 0436   PROT 5.5 (L) 08/22/2015 0436   ALBUMIN 2.5 (L) 08/22/2015 0436   AST 147 (H) 08/22/2015 0436   ALT 143 (H) 08/22/2015 0436   ALKPHOS 205 (H) 08/22/2015 0436   BILITOT 12.2 (H) 08/22/2015 0436   GFRNONAA >60 08/22/2015 0436   GFRAA >60 08/22/2015 0436   CA 19-9 on 05/20/2015-910   Imaging: As per history of present illness, CT images from   Assessment/Plan:   1. Pancreas  cancer-presenting with obstructive jaundice  CT 08/18/2015 with intra-and extrahepatic Dillard dilatation and dilatation of the pancreas duct, vague fullness at the head of the pancreas  EUS 10/05/2015 confirmed a pancreas head mass with no evidence of vascular invasion and no peripancreatic adenopathy (uT2,uN0) sees  EUS biopsy of the pancreas head mass on 10/05/2015 confirmed adenocarcinoma  2.    Obstructive jaundice secondary to #1-test was placement of a bile duct stent  3.    Hypertension   Disposition:   Mr. Weishaupt has been diagnosed with pancreas cancer. I discussed the diagnosis and treatment options with him. He appears to have resectable disease based on the staging evaluation to date. The markedly elevated CA 19-9 was drawn when he had biliary obstruction.  We discussed a surgical referral. He request a referral to [facility] since he has family there. We will make a referral to the surgical service at [facility]. He will likely require additional imaging and a repeat CA 19-9 prior to a decision on resectability. If he is deemed resectable options will include upfront surgery versus neoadjuvant therapy. We discussed the SWOG [phone]  study which randomizes patients to neoadjuvant FOLFIRINOX versus gemcitabine/Abraxane.  Mr. Oros will return for an office visit and further discussion in 2 weeks.  Approximately 50 minutes were spent with patient today. The majority of the time was used for counseling and coordination of care.  Betsy Coder, MD  11/03/2015, 5:21 PM

## 2015-11-07 ENCOUNTER — Telehealth: Payer: Self-pay | Admitting: Oncology

## 2015-11-07 NOTE — Telephone Encounter (Signed)
Pt appt to see Dr. Laddie Aquas is 11/16/15@11 :00 pt is aware. Called Tedra Coupe she will send films thru [employer]. Faxed records [phone]

## 2015-11-09 NOTE — Telephone Encounter (Signed)
Oncology Nurse Navigator Documentation  Oncology Nurse Navigator Flowsheets 11/09/2015  Navigator Location [facility]  Referral date to RadOnc/MedOnc -  Navigator Encounter Type Telephone  Telephone Incoming Call;Appt Confirmation/Clarification--patient called inquire when his appointment at [facility] was with Dr. Laddie Aquas? Informed him it is on 11/15 at 11:00 and they will be mailing information to his home.  Abnormal Finding Date -  Confirmed Diagnosis Date -  Patient Visit Type -  Treatment Phase -  Barriers/Navigation Needs Coordination of Care--requesting [facility] email him the new patient packet as well to [email or web address]  Education -  Interventions Coordination of Care--called referral coordinator, Kim at [employer] and provided email address.  Coordination of Care Appts  Education Method -  Support Groups/Services -  Acuity -  Time Spent with Patient 15

## 2015-11-17 ENCOUNTER — Ambulatory Visit: Payer: Medicare Other | Admitting: Nurse Practitioner

## 2015-11-18 ENCOUNTER — Telehealth: Payer: Self-pay | Admitting: *Deleted

## 2015-11-18 NOTE — Telephone Encounter (Signed)
Oncology Nurse Navigator Documentation  Oncology Nurse Navigator Flowsheets 11/18/2015  Navigator Location [facility]  Referral date to RadOnc/MedOnc -  Navigator Encounter Type Telephone  Telephone Outgoing Call;Patient Update--called to f/u on his appointments at [facility] this week.  Abnormal Finding Date -  Confirmed Diagnosis Date -  Patient Visit Type -  Treatment Phase -  Barriers/Navigation Needs -  Education -  Interventions -  Coordination of Care -  Education Method -  Support Groups/Services -  Acuity -  Time Spent with Patient 79  Tells RN that he needs neoadjuvant treatment before surgery is considered. Thinking about having Dr. Benay Spice do the chemo treatment. He plans on one more phone call to [facility] and he will call navigator on Monday with his decision. Offered to make appointment now for next week to talk, but he declined at this time.

## 2015-11-22 ENCOUNTER — Telehealth: Payer: Self-pay | Admitting: *Deleted

## 2015-11-22 NOTE — Telephone Encounter (Signed)
Oncology Nurse Navigator Documentation  Oncology Nurse Navigator Flowsheets 11/22/2015  Navigator Location [facility]  Referral date to RadOnc/MedOnc -  Navigator Encounter Type Telephone  Telephone Incoming Call;Patient Update  Abnormal Finding Date -  Confirmed Diagnosis Date -  Patient Visit Type -  Treatment Phase -  Barriers/Navigation Needs -  Education -  Interventions -  Coordination of Care -  Education Method -  Support Groups/Services -  Acuity -  Time Spent with Patient -  Dustin Lucas reports he requires neoadjuvant chemotherapy before being considered for surgery. Requests for this to occur in [place] with Dr. Benay Spice. Would like to schedule an appointment to discuss this.

## 2015-11-22 NOTE — Telephone Encounter (Signed)
Called patient back and provided him appointment with Ned Card for 11/28 at 2:45 pm. He appreciated the quick response.

## 2015-11-29 ENCOUNTER — Ambulatory Visit (HOSPITAL_BASED_OUTPATIENT_CLINIC_OR_DEPARTMENT_OTHER): Payer: Medicare Other | Admitting: Nurse Practitioner

## 2015-11-29 ENCOUNTER — Encounter: Payer: Self-pay | Admitting: *Deleted

## 2015-11-29 VITALS — BP 157/71 | HR 85 | Temp 98.3°F | Resp 18 | Ht 75.0 in | Wt 208.1 lb

## 2015-11-29 DIAGNOSIS — C250 Malignant neoplasm of head of pancreas: Secondary | ICD-10-CM | POA: Diagnosis not present

## 2015-11-29 DIAGNOSIS — I10 Essential (primary) hypertension: Secondary | ICD-10-CM | POA: Diagnosis not present

## 2015-11-29 MED ORDER — LIDOCAINE-PRILOCAINE 2.5-2.5 % EX CREA: TOPICAL_CREAM | CUTANEOUS | 2 refills | Status: DC

## 2015-11-29 MED ORDER — PROCHLORPERAZINE MALEATE 10 MG PO TABS: 10.0000 mg | ORAL_TABLET | Freq: Four times a day (QID) | ORAL | 0 refills | Status: DC | PRN

## 2015-11-29 NOTE — Progress Notes (Signed)
Oncology Nurse Navigator Documentation  Oncology Nurse Navigator Flowsheets 11/29/2015  Navigator Location [facility]  Referral date to RadOnc/MedOnc -  Navigator Encounter Type Telephone  Telephone Outgoing Call;Diagnostic Results  Abnormal Finding Date -  Confirmed Diagnosis Date -  Patient Visit Type -  Treatment Phase -  Barriers/Navigation Needs -  Education -  Interventions Other--called Helpdesk w/[facility] and they pulled the [facility] images into [facility] system  Coordination of Care -  Education Method -  Support Groups/Services -  Acuity -  Time Spent with Patient 15

## 2015-11-29 NOTE — Progress Notes (Addendum)
[place] OFFICE PROGRESS NOTE   Diagnosis:  Pancreas cancer  INTERVAL HISTORY:   Mr. Dustin Lucas returns as scheduled. He overall feels well. He denies abdominal pain. He has a good appetite. He has stable chronic right hip pain. No nausea or vomiting. Bowels moving regularly. He has a chronic "tingling" sensation in both lower legs. No associated numbness.  Objective:  Vital signs in last 24 hours:  Blood pressure (!) 157/71, pulse 85, temperature 98.3 F (36.8 C), temperature source Oral, resp. rate 18, height 6\' 3"  (1.905 m), weight 208 lb 1.6 oz (94.4 kg), SpO2 100 %.    HEENT: No thrush or ulcers. Resp: Lungs clear bilaterally. Cardio: Regular rate and rhythm. GI: Abdomen soft and nontender. No organomegaly. No mass. Vascular: No leg edema.   Lab Results:  Lab Results  Component Value Date   WBC 4.9 08/22/2015   HGB 13.9 08/29/2015   HCT 41.0 08/29/2015   MCV 91.9 08/22/2015   PLT 151 08/22/2015   NEUTROABS 3.1 08/21/2015    Imaging:  No results found.  Medications: I have reviewed the patient's current medications.  Assessment/Plan: 1. Pancreas cancer-presenting with obstructive jaundice ? CT 08/18/2015 with intra-and extrahepatic Dillard dilatation and dilatation of the pancreas duct, vague fullness at the head of the pancreas ? EUS 10/05/2015 confirmed a pancreas head mass with no evidence of vascular invasion and no peripancreatic adenopathy (uT2,uN0) sees ? EUS biopsy of the pancreas head mass on 10/05/2015 confirmed adenocarcinoma ? CT chest/abdomen at [facility] 11/16/2015-hypoenhancing mass in the head and uncinate process of the pancreas with increased marked pancreatic ductal dilatation and greater than 180 encasement of the superior mesenteric artery and superior mesenteric vein above the level of the first tributary.  2.    Obstructive jaundice secondary to #1-status post placement of a bile duct stent  3.     Hypertension   Disposition: Mr. Dufrene appears stable. He has borderline resectable pancreas cancer. He was seen by surgical oncology and medical oncology at [facility]. The recommendation is for neoadjuvant chemotherapy with FOLFIRINOX. We reviewed potential toxicities including myelosuppression, nausea, diarrhea, hair loss, allergic reaction, mouth sores. We discussed the neurotoxicity associated with oxaliplatin including cold sensitivity, peripheral neuropathy, acute larynogpharyngeal dysesthesia and more rare occurrences such as diplopia, ataxia, incontinence. We reviewed the diarrhea associated with irinotecan, both early and late phase. He understands there is potential for the diarrhea to be severe. We discussed potential toxicities associated with 5-fluorouracil including mouth sores, diarrhea, skin rash, skin hyperpigmentation, increased sensitivity to sun, conjunctivitis, hand-foot syndrome. He is agreeable to proceed. He will receive FOLFOX for 2 cycles with the plan to add irinotecan with cycle 3 pending his tolerance.  He will attend a chemotherapy education class.  We are referring him for placement of a Port-A-Cath.  Prescriptions were sent to his pharmacy for Compazine and Emla cream.  He will return for a follow-up visit and cycle 1 FOLFOX on 12/12/2015. He will contact the office in the interim with any problems.   Patient seen with Dr. Benay Spice. 30 minutes were spent face-to-face at today's visit with the majority of that time involved in counseling/coordination of care.     Ned Card ANP/GNP-BC   11/29/2015  3:42 PM This was a shared visit with Ned Card. Mr. Schmeichel was interviewed and examined. He was seen by medical oncology and surgical oncology at [facility]. The tumor is felt to be "borderline "resectable. Neoadjuvant therapy is recommended. The plan is to begin FOLFOX chemotherapy. Irinotecan  will be added with cycle 2 or 3 pending his tolerance of FOLFOX. We reviewed the  potential toxicities associated with FOLFOX and he agrees to proceed.  Julieanne Manson, M.D.

## 2015-11-29 NOTE — Progress Notes (Signed)
START ON PATHWAY REGIMEN - Pancreatic  PANOS56: FOLFIRINOX q14 Days x 4 Cycles   A cycle is every 14 days:     Oxaliplatin (Eloxatin(R)) 85 mg/m2 in 250 mL D5W IV over 2 hours day 1 Dose Mod: None     Leucovorin 400 mg/m2 in 250 mL D5W IV over 90 minutes day 1 to run concurrently with irinotecan Dose Mod: None     Irinotecan (Camptosar(R)) 180 mg/m2 in 500 mL D5W IV over 90 minutes day 1 Dose Mod: None     5-Fluorouracil 400 mg/m2 IV bolus over 2-4 minutes day 1 Dose Mod: None     5-Fluorouracil 2,400 mg/m2 in _____mL NS IV as a 46 hour infusion day 1 Dose Mod: None Additional Orders: Oxaliplatin incompatible with NS fluids. Flush line with D5W prior to administration.  **Always confirm dose/schedule in your pharmacy ordering system**    Patient Characteristics: Adenocarcinoma, Locally Advanced, Anatomically Unresectable, First Line, PS = 0, 1 Current evidence of distant metastases? No AJCC T Stage: 4 AJCC N Stage: 0 AJCC Stage Grouping: III AJCC M Stage: 0 Histology: Adenocarcinoma Line of therapy: First Line Would you be surprised if this patient died  in the next year? I would be surprised if this patient died in the next year  Intent of Therapy: Curative Intent, Discussed with Patient

## 2015-12-06 ENCOUNTER — Telehealth: Payer: Self-pay | Admitting: *Deleted

## 2015-12-06 ENCOUNTER — Other Ambulatory Visit: Payer: Self-pay | Admitting: Radiology

## 2015-12-06 ENCOUNTER — Encounter: Payer: Self-pay | Admitting: *Deleted

## 2015-12-06 NOTE — Telephone Encounter (Signed)
Per staff message from GI navigator I h ave scehdueld appts and notified her. m

## 2015-12-06 NOTE — Progress Notes (Signed)
Mr. Shriber called back and confirmed chemo education class and [facility] appointments and information for each. Message sent to infusion scheduler regarding 1st chemo due 12/11.

## 2015-12-06 NOTE — Telephone Encounter (Signed)
Notified GI navigator that the first available on 12/11 was given

## 2015-12-07 ENCOUNTER — Encounter: Payer: Self-pay | Admitting: *Deleted

## 2015-12-07 ENCOUNTER — Other Ambulatory Visit: Payer: Medicare Other

## 2015-12-07 NOTE — Telephone Encounter (Signed)
appts made per GI navigator and notified her appts in. First available given

## 2015-12-08 ENCOUNTER — Ambulatory Visit (HOSPITAL_COMMUNITY)
Admission: RE | Admit: 2015-12-08 | Discharge: 2015-12-08 | Disposition: A | Discharge status: Home / Self Care | Payer: Medicare Other | Source: Ambulatory Visit | Attending: Nurse Practitioner | Admitting: Nurse Practitioner

## 2015-12-08 ENCOUNTER — Encounter (HOSPITAL_COMMUNITY): Payer: Self-pay

## 2015-12-08 ENCOUNTER — Other Ambulatory Visit: Payer: Self-pay | Admitting: Nurse Practitioner

## 2015-12-08 DIAGNOSIS — Z8546 Personal history of malignant neoplasm of prostate: Secondary | ICD-10-CM | POA: Diagnosis not present

## 2015-12-08 DIAGNOSIS — C259 Malignant neoplasm of pancreas, unspecified: Secondary | ICD-10-CM | POA: Diagnosis not present

## 2015-12-08 DIAGNOSIS — Z79899 Other long term (current) drug therapy: Secondary | ICD-10-CM | POA: Insufficient documentation

## 2015-12-08 DIAGNOSIS — I10 Essential (primary) hypertension: Secondary | ICD-10-CM | POA: Diagnosis not present

## 2015-12-08 DIAGNOSIS — C250 Malignant neoplasm of head of pancreas: Secondary | ICD-10-CM

## 2015-12-08 DIAGNOSIS — Z7982 Long term (current) use of aspirin: Secondary | ICD-10-CM | POA: Insufficient documentation

## 2015-12-08 HISTORY — PX: IR GENERIC HISTORICAL: IMG1180011

## 2015-12-08 LAB — CBC
HEMATOCRIT: 42.2 % (ref 39.0–52.0)
HEMOGLOBIN: 14.5 g/dL (ref 13.0–17.0)
MCH: 31.9 pg (ref 26.0–34.0)
MCHC: 34.4 g/dL (ref 30.0–36.0)
MCV: 93 fL (ref 78.0–100.0)
Platelets: 151 10*3/uL (ref 150–400)
RBC: 4.54 MIL/uL (ref 4.22–5.81)
RDW: 14.4 % (ref 11.5–15.5)
WBC: 5.4 10*3/uL (ref 4.0–10.5)

## 2015-12-08 LAB — PROTIME-INR
INR: 1.09
Prothrombin Time: 14.1 seconds (ref 11.4–15.2)

## 2015-12-08 LAB — BASIC METABOLIC PANEL
Anion gap: 9 (ref 5–15)
BUN: 7 mg/dL (ref 6–20)
CHLORIDE: 105 mmol/L (ref 101–111)
CO2: 27 mmol/L (ref 22–32)
Calcium: 9.4 mg/dL (ref 8.9–10.3)
Creatinine, Ser: 0.89 mg/dL (ref 0.61–1.24)
GFR calc Af Amer: >60 mL/min (ref >60)
GFR calc non Af Amer: >60 mL/min (ref >60)
GLUCOSE: 86 mg/dL (ref 65–99)
POTASSIUM: 3.6 mmol/L (ref 3.5–5.1)
Sodium: 141 mmol/L (ref 135–145)

## 2015-12-08 LAB — APTT: APTT: 38 s — AB (ref 24–36)

## 2015-12-08 MED ORDER — LIDOCAINE HCL 1 % IJ SOLN
INTRAMUSCULAR | Status: AC
  Administered 2015-12-08: 16 mL
  Filled 2015-12-08: qty 20

## 2015-12-08 MED ORDER — FENTANYL CITRATE (PF) 100 MCG/2ML IJ SOLN
INTRAMUSCULAR | Status: AC | PRN
  Administered 2015-12-08: 50 ug via INTRAVENOUS

## 2015-12-08 MED ORDER — FENTANYL CITRATE (PF) 100 MCG/2ML IJ SOLN
INTRAMUSCULAR | Status: AC
  Filled 2015-12-08: qty 4

## 2015-12-08 MED ORDER — MIDAZOLAM HCL 2 MG/2ML IJ SOLN
INTRAMUSCULAR | Status: AC | PRN
  Administered 2015-12-08: 1 mg via INTRAVENOUS

## 2015-12-08 MED ORDER — CEFAZOLIN SODIUM-DEXTROSE 2-4 GM/100ML-% IV SOLN: 2.0000 g | Freq: Once | INTRAVENOUS | Status: DC

## 2015-12-08 MED ORDER — HEPARIN SOD (PORK) LOCK FLUSH 100 UNIT/ML IV SOLN
INTRAVENOUS | Status: AC
  Administered 2015-12-08: 500 [IU]
  Filled 2015-12-08: qty 5

## 2015-12-08 MED ORDER — MIDAZOLAM HCL 2 MG/2ML IJ SOLN
INTRAMUSCULAR | Status: AC
  Filled 2015-12-08: qty 4

## 2015-12-08 MED ORDER — SODIUM CHLORIDE 0.9 % IV SOLN: INTRAVENOUS | Status: DC

## 2015-12-08 MED ORDER — CEFAZOLIN SODIUM-DEXTROSE 2-4 GM/100ML-% IV SOLN
INTRAVENOUS | Status: AC
  Administered 2015-12-08: 2000 mg
  Filled 2015-12-08: qty 100

## 2015-12-08 NOTE — H&P (Signed)
Chief Complaint: Patient was seen in consultation today for port a catheter placement at the request of Dustin Lucas  Referring Physician(s): Dustin Lucas Dr Julieanne Manson  Supervising Physician: Corrie Mckusick  Patient Status: [facility] - Out-pt  History of Present Illness: Dustin Lucas is a 75 y.o. male   Dx Pancreatic Ca 10/2015 To start chemotherapy 12/12/15 Follows with Dr Darrold Junker   Past Medical History:  Diagnosis Date  . Arthritis   . Cancer ([place]) 7-8 yrs ago   prostate cancer, monitoring psa levels q 6 months  . Hypertension   . PONV (postoperative nausea and vomiting)    in past , none recent  . Urinary frequency     Past Surgical History:  Procedure Laterality Date  . ERCP N/A 08/21/2015   Procedure: ENDOSCOPIC RETROGRADE CHOLANGIOPANCREATOGRAPHY (ERCP);  Surgeon: Clarene Essex, MD;  Location: [facility];  Service: Endoscopy;  Laterality: N/A;  . ERCP N/A 08/20/2015   Procedure: ENDOSCOPIC RETROGRADE CHOLANGIOPANCREATOGRAPHY (ERCP);  Surgeon: Carol Ada, MD;  Location: [facility];  Service: Endoscopy;  Laterality: N/A;  . ERCP N/A 10/05/2015   Procedure: ENDOSCOPIC RETROGRADE CHOLANGIOPANCREATOGRAPHY (ERCP);  Surgeon: Arta Silence, MD;  Location: [facility];  Service: Endoscopy;  Laterality: N/A;  Dr. Watt Climes to do ERCP  . ESOPHAGOGASTRODUODENOSCOPY (EGD) WITH PROPOFOL N/A 08/29/2015   Procedure: ESOPHAGOGASTRODUODENOSCOPY (EGD) WITH PROPOFOL;  Surgeon: Arta Silence, MD;  Location: [facility];  Service: Endoscopy;  Laterality: N/A;  . EUS N/A 10/05/2015   Procedure: UPPER ENDOSCOPIC ULTRASOUND (EUS) RADIAL;  Surgeon: Arta Silence, MD;  Location: [facility];  Service: Endoscopy;  Laterality: N/A;  . FRACTURE SURGERY Left    plate in ankle  . JOINT REPLACEMENT     hip replacement  right  . TONSILLECTOMY    . TOTAL HIP REVISION Right 08/09/2014   Procedure: TOTAL HIP REVISION;  Surgeon: Frederik Pear, MD;  Location: [facility];  Service: Orthopedics;   Laterality: Right;  REVISION (ASR) RIGHT TOTAL HIP ARHROPLASTY**INNOMET OSTEOTONES  . UPPER ESOPHAGEAL ENDOSCOPIC ULTRASOUND (EUS) N/A 08/29/2015   Procedure: UPPER ESOPHAGEAL ENDOSCOPIC ULTRASOUND (EUS);  Surgeon: Arta Silence, MD;  Location: [facility];  Service: Endoscopy;  Laterality: N/A;    Allergies: Shellfish allergy  Medications: Prior to Admission medications   Medication Sig Start Date End Date Taking? Authorizing Provider  amLODipine-valsartan (EXFORGE) 5-320 MG per tablet Take 1 tablet by mouth daily. 07/20/14  Yes Historical Provider, MD  aspirin 81 MG chewable tablet Chew 1 tablet (81 mg total) by mouth daily. Start taking 08/29/2015 08/29/15  Yes Iskra Barbera Setters, MD  Cholecalciferol (VITAMIN D-3) 1000 UNITS CAPS Take 2,000 Units by mouth daily.    Yes Historical Provider, MD  dutasteride (AVODART) 0.5 MG capsule Take 0.5 mg by mouth daily. 07/12/14  Yes Historical Provider, MD  folic acid (FOLVITE) 1 MG tablet Take 1 mg by mouth daily.   Yes Historical Provider, MD  acetaminophen (TYLENOL) 325 MG tablet Take 2 tablets (650 mg total) by mouth every 6 (six) hours as needed for mild pain (or Fever >/= 101). 08/22/15   Theodis Blaze, MD  lidocaine-prilocaine (EMLA) cream Apply to portacath site 1 hour prior to use 11/29/15   Owens Shark, NP  prochlorperazine (COMPAZINE) 10 MG tablet Take 1 tablet (10 mg total) by mouth every 6 (six) hours as needed for nausea or vomiting. 11/29/15   Owens Shark, NP     History reviewed. No pertinent family history.  Social History   Social History  . Marital status:  Married    Spouse name: Dustin Lucas  . Number of children: 0  . Years of education: N/A   Occupational History  . 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 Smoking status: Never Smoker  . Smokeless tobacco: Never Used  . Alcohol use No  . Drug use: No  . Sexual activity: Not Asked   Other Topics Concern  . None   Social  History Narrative   Married to Dustin Lucas (2nd marriage)   No children   Retired Database administrator biology    Review of Systems: A 12 point ROS discussed and pertinent positives are indicated in the HPI above.  All other systems are negative.  Review of Systems  Constitutional: Positive for activity change, appetite change, fatigue and unexpected weight change. Negative for fever.  Cardiovascular: Negative for chest pain.  Gastrointestinal: Negative for abdominal pain.  Neurological: Positive for weakness.  Psychiatric/Behavioral: Negative for behavioral problems and confusion.    Vital Signs: BP (!) 144/79   Pulse 73   Temp 98.5 F (36.9 C)   Resp 20   Ht 6\' 3"  (1.905 m)   Wt 208 lb (94.3 kg)   SpO2 99%   BMI 26.00 kg/m   Physical Exam  Constitutional: He is oriented to person, place, and time.  Cardiovascular: Normal rate, regular rhythm and normal heart sounds.   Pulmonary/Chest: Effort normal and breath sounds normal. He has no wheezes.  Abdominal: Soft. Bowel sounds are normal. There is no tenderness.  Musculoskeletal: Normal range of motion.  Neurological: He is alert and oriented to person, place, and time.  Skin: Skin is warm and dry.  Psychiatric: He has a normal mood and affect. His behavior is normal. Judgment and thought content normal.  Nursing note and vitals reviewed.   Mallampati Score:  MD Evaluation Airway: WNL Heart: WNL Abdomen: WNL Chest/ Lungs: WNL ASA  Classification: 3 Mallampati/Airway Score: One  Imaging: No results found.  Labs:  CBC:  Recent Labs  08/18/15 1949 08/21/15 0435 08/22/15 0436 08/29/15 0845 08/29/15 0915 12/08/15 0750  WBC 5.0 5.0 4.9  --   --  5.4  HGB 13.6 12.8* 12.6* 15.6 13.9 14.5  HCT 38.2* 36.8* 36.5* 46.0 41.0 42.2  PLT 172 155 151  --   --  151    COAGS:  Recent Labs  08/18/15 1949 12/08/15 0750  INR 1.17 1.09  APTT 33 38*    BMP:  Recent Labs  08/20/15 0401  08/21/15 0435 08/22/15 0436 08/29/15 0845 08/29/15 0915 12/08/15 0750  NA 139 140 139 141 140 141  K 4.4 3.4* 3.9 7.7* 5.7* 3.6  CL 108 110 109  --   --  105  CO2 24 23 25   --   --  27  GLUCOSE 88 116* 103* 93 84 86  BUN 7 10 7   --   --  7  CALCIUM 8.9 8.7* 8.7*  --   --  9.4  CREATININE 0.50* 0.65 0.59*  --   --  0.89  GFRNONAA >60 >60 >60  --   --  >60  GFRAA >60 >60 >60  --   --  >60    LIVER FUNCTION TESTS:  Recent Labs  08/18/15 1949 08/21/15 0435 08/22/15 0436  BILITOT 15.3* 13.5* 12.2*  AST 179* 147* 147*  ALT 209* 149* 143*  ALKPHOS 244* 190* 205*  PROT 6.9 5.7* 5.5*  ALBUMIN 3.1* 2.5* 2.5*  TUMOR MARKERS:  Recent Labs  08/20/15 1817  CA199 910*    Assessment and Plan:  Pancreatic Ca Request for PAC placement To start chemo 12/11 Risks and Benefits discussed with the patient including, but not limited to bleeding, infection, pneumothorax, or fibrin sheath development and need for additional procedures. All of the patient's questions were answered, patient is agreeable to proceed. Consent signed and in chart.   Thank you for this interesting consult.  I greatly enjoyed meeting Rawlin Antolini and look forward to participating in their care.  A copy of this report was sent to the requesting provider on this date.  Electronically Signed: Derral Colucci A 12/08/2015, 9:03 AM   I spent a total of  30 Minutes   in face to face in clinical consultation, greater than 50% of which was counseling/coordinating care for [facility] placement

## 2015-12-08 NOTE — Sedation Documentation (Signed)
02 d/c 

## 2015-12-08 NOTE — Sedation Documentation (Signed)
Patient is resting comfortably. 

## 2015-12-08 NOTE — Discharge Instructions (Signed)
Implanted deviceImplanted Port Insertion, Care After Refer to this sheet in the next few weeks. These instructions provide you with information on caring for yourself after your procedure. Your health care provider may also give you more specific instructions. Your treatment has been planned according to current medical practices, but problems sometimes occur. Call your health care provider if you have any problems or questions after your procedure. WHAT TO EXPECT AFTER THE PROCEDURE After your procedure, it is typical to have the following:   Discomfort at the port insertion site. Ice packs to the area will help.  Bruising on the skin over the port. This will subside in 3-4 days. HOME CARE INSTRUCTIONS  After your port is placed, you will get a manufacturer's information card. The card has information about your port. Keep this card with you at all times.   Know what kind of port you have. There are many types of ports available.   Wear a medical alert bracelet in case of an emergency. This can help alert health care workers that you have a port.   The port can stay in for as long as your health care provider believes it is necessary.   A home health care nurse may give medicines and take care of the port.   You or a family member can get special training and directions for giving medicine and taking care of the port at home.  SEEK MEDICAL CARE IF:   Your port does not flush or you are unable to get a blood return.   You have a fever or chills. SEEK IMMEDIATE MEDICAL CARE IF:  You have new fluid or pus coming from your incision.   You notice a bad smell coming from your incision site.   You have swelling, pain, or more redness at the incision or port site.   You have chest pain or shortness of breath. This information is not intended to replace advice given to you by your health care provider. Make sure you discuss any questions you have with your health care  provider. Document Released: 10/08/2012 Document Revised: 12/23/2012 Document Reviewed: 10/08/2012 Elsevier Interactive Patient Education  2017 [place]. Moderate Conscious Sedation, Adult, Care After These instructions provide you with information about caring for yourself after your procedure. Your health care provider may also give you more specific instructions. Your treatment has been planned according to current medical practices, but problems sometimes occur. Call your health care provider if you have any problems or questions after your procedure. What can I expect after the procedure? After your procedure, it is common:  To feel sleepy for several hours.  To feel clumsy and have poor balance for several hours.  To have poor judgment for several hours.  To vomit if you eat too soon. Follow these instructions at home: For at least 24 hours after the procedure:   Do not:  Participate in activities where you could fall or become injured.  Drive.  Use heavy machinery.  Drink alcohol.  Take sleeping pills or medicines that cause drowsiness.  Make important decisions or sign legal documents.  Take care of children on your own.  Rest. Eating and drinking  Follow the diet recommended by your health care provider.  If you vomit:  Drink water, juice, or soup when you can drink without vomiting.  Make sure you have little or no nausea before eating solid foods. General instructions  Have a responsible adult stay with you until you are awake and alert.  Take over-the-counter and prescription medicines only as told by your health care provider.  If you smoke, do not smoke without supervision.  Keep all follow-up visits as told by your health care provider. This is important. Contact a health care provider if:  You keep feeling nauseous or you keep vomiting.  You feel light-headed.  You develop a rash.  You have a fever. Get help right away if:  You have  trouble breathing. This information is not intended to replace advice given to you by your health care provider. Make sure you discuss any questions you have with your health care provider. Document Released: 10/08/2012 Document Revised: 05/23/2015 Document Reviewed: 04/09/2015 Elsevier Interactive Patient Education  2017 [employer].

## 2015-12-08 NOTE — Procedures (Signed)
Interventional Radiology Procedure Note  Procedure: Placement of a right IJ approach single lumen PowerPort.  Tip is positioned at the superior cavoatrial junction and catheter is ready for immediate use.  Complications: No immediate Recommendations:  - Ok to shower tomorrow - Do not submerge for 7 days - Routine line care   Signed,  Consuello Lassalle S. Billyjoe Go, DO    

## 2015-12-08 NOTE — Sedation Documentation (Addendum)
Patient denies pain and is resting comfortably.  

## 2015-12-11 ENCOUNTER — Other Ambulatory Visit: Payer: Self-pay | Admitting: Oncology

## 2015-12-12 ENCOUNTER — Ambulatory Visit (HOSPITAL_BASED_OUTPATIENT_CLINIC_OR_DEPARTMENT_OTHER): Payer: Medicare Other

## 2015-12-12 ENCOUNTER — Ambulatory Visit (HOSPITAL_BASED_OUTPATIENT_CLINIC_OR_DEPARTMENT_OTHER): Payer: Medicare Other | Admitting: Oncology

## 2015-12-12 ENCOUNTER — Other Ambulatory Visit (HOSPITAL_BASED_OUTPATIENT_CLINIC_OR_DEPARTMENT_OTHER): Payer: Medicare Other

## 2015-12-12 VITALS — BP 128/81 | HR 83 | Temp 98.6°F | Ht 75.0 in | Wt 200.5 lb

## 2015-12-12 DIAGNOSIS — Z5111 Encounter for antineoplastic chemotherapy: Secondary | ICD-10-CM | POA: Diagnosis not present

## 2015-12-12 DIAGNOSIS — Z452 Encounter for adjustment and management of vascular access device: Secondary | ICD-10-CM | POA: Diagnosis not present

## 2015-12-12 DIAGNOSIS — C250 Malignant neoplasm of head of pancreas: Secondary | ICD-10-CM | POA: Diagnosis not present

## 2015-12-12 DIAGNOSIS — Z95828 Presence of other vascular implants and grafts: Secondary | ICD-10-CM | POA: Insufficient documentation

## 2015-12-12 DIAGNOSIS — I10 Essential (primary) hypertension: Secondary | ICD-10-CM | POA: Diagnosis not present

## 2015-12-12 LAB — CBC WITH DIFFERENTIAL/PLATELET
BASO%: 0.5 % (ref 0.0–2.0)
BASOS ABS: 0 10*3/uL (ref 0.0–0.1)
EOS ABS: 0 10*3/uL (ref 0.0–0.5)
EOS%: 0.7 % (ref 0.0–7.0)
HCT: 43.6 % (ref 38.4–49.9)
HGB: 14.2 g/dL (ref 13.0–17.1)
LYMPH%: 28.7 % (ref 14.0–49.0)
MCH: 31 pg (ref 27.2–33.4)
MCHC: 32.7 g/dL (ref 32.0–36.0)
MCV: 94.8 fL (ref 79.3–98.0)
MONO#: 0.6 10*3/uL (ref 0.1–0.9)
MONO%: 8.7 % (ref 0.0–14.0)
NEUT%: 61.4 % (ref 39.0–75.0)
NEUTROS ABS: 4 10*3/uL (ref 1.5–6.5)
PLATELETS: 144 10*3/uL (ref 140–400)
RBC: 4.59 10*6/uL (ref 4.20–5.82)
RDW: 14.3 % (ref 11.0–14.6)
WBC: 6.5 10*3/uL (ref 4.0–10.3)
lymph#: 1.9 10*3/uL (ref 0.9–3.3)

## 2015-12-12 LAB — COMPREHENSIVE METABOLIC PANEL
ALBUMIN: 3.1 g/dL — AB (ref 3.5–5.0)
ALK PHOS: 101 U/L (ref 40–150)
ALT: 12 U/L (ref 0–55)
ANION GAP: 9 meq/L (ref 3–11)
AST: 17 U/L (ref 5–34)
BILIRUBIN TOTAL: 1.22 mg/dL — AB (ref 0.20–1.20)
BUN: 16.7 mg/dL (ref 7.0–26.0)
CO2: 26 meq/L (ref 22–29)
Calcium: 9.5 mg/dL (ref 8.4–10.4)
Chloride: 107 mEq/L (ref 98–109)
Creatinine: 1.1 mg/dL (ref 0.7–1.3)
EGFR: 80 mL/min/{1.73_m2} — AB (ref >90)
Glucose: 96 mg/dl (ref 70–140)
Potassium: 3.9 mEq/L (ref 3.5–5.1)
Sodium: 143 mEq/L (ref 136–145)
TOTAL PROTEIN: 7.3 g/dL (ref 6.4–8.3)

## 2015-12-12 MED ORDER — LEUCOVORIN CALCIUM INJECTION 350 MG
400.0000 mg/m2 | Freq: Once | INTRAVENOUS | Status: AC
  Administered 2015-12-12: 876 mg via INTRAVENOUS
  Filled 2015-12-12: qty 43.8

## 2015-12-12 MED ORDER — SODIUM CHLORIDE 0.9 % IV SOLN
2400.0000 mg/m2 | INTRAVENOUS | Status: DC
  Administered 2015-12-12: 5250 mg via INTRAVENOUS
  Filled 2015-12-12: qty 105

## 2015-12-12 MED ORDER — DEXTROSE 5 % IV SOLN
Freq: Once | INTRAVENOUS | Status: AC
  Administered 2015-12-12: 14:00:00 via INTRAVENOUS

## 2015-12-12 MED ORDER — FLUOROURACIL CHEMO INJECTION 2.5 GM/50ML
400.0000 mg/m2 | Freq: Once | INTRAVENOUS | Status: AC
  Administered 2015-12-12: 900 mg via INTRAVENOUS
  Filled 2015-12-12: qty 18

## 2015-12-12 MED ORDER — DEXAMETHASONE SODIUM PHOSPHATE 10 MG/ML IJ SOLN
10.0000 mg | Freq: Once | INTRAMUSCULAR | Status: AC
  Administered 2015-12-12: 10 mg via INTRAVENOUS

## 2015-12-12 MED ORDER — DEXAMETHASONE SODIUM PHOSPHATE 10 MG/ML IJ SOLN
INTRAMUSCULAR | Status: AC
  Filled 2015-12-12: qty 1

## 2015-12-12 MED ORDER — PALONOSETRON HCL INJECTION 0.25 MG/5ML
0.2500 mg | Freq: Once | INTRAVENOUS | Status: AC
  Administered 2015-12-12: 0.25 mg via INTRAVENOUS

## 2015-12-12 MED ORDER — SODIUM CHLORIDE 0.9% FLUSH
10.0000 mL | INTRAVENOUS | Status: DC | PRN
  Administered 2015-12-12: 10 mL via INTRAVENOUS
  Filled 2015-12-12: qty 10

## 2015-12-12 MED ORDER — OXALIPLATIN CHEMO INJECTION 100 MG/20ML
85.0000 mg/m2 | Freq: Once | INTRAVENOUS | Status: AC
  Administered 2015-12-12: 185 mg via INTRAVENOUS
  Filled 2015-12-12: qty 37

## 2015-12-12 MED ORDER — PALONOSETRON HCL INJECTION 0.25 MG/5ML
INTRAVENOUS | Status: AC
  Filled 2015-12-12: qty 5

## 2015-12-12 MED ORDER — DEXAMETHASONE SODIUM PHOSPHATE 100 MG/10ML IJ SOLN
10.0000 mg | Freq: Once | INTRAMUSCULAR | Status: DC
Start: 2015-12-12 — End: 2015-12-12

## 2015-12-12 NOTE — Progress Notes (Signed)
  [place] OFFICE PROGRESS NOTE   Diagnosis: Pancreas cancer  INTERVAL HISTORY:   Dustin Lucas returns as scheduled. He has attended a chemotherapy teaching class. He is scheduled to begin chemotherapy today. He underwent placement of a Port-A-Cath 12/08/2015. He continues to have anorexia.  Objective:  Vital signs in last 24 hours:  Blood pressure 128/81, pulse 83, temperature 98.6 F (37 C), temperature source Oral, height 6\' 3"  (1.905 m), weight 200 lb 8 oz (90.9 kg), SpO2 100 %.    HEENT: Sclera anicteric Resp: Clear bilaterally Cardio: Regular rate and rhythm GI: No hepatomegaly, no mass, nontender Vascular: No leg edema   Portacath/PICC-without erythema  Lab Results:  Lab Results  Component Value Date   WBC 6.5 12/12/2015   HGB 14.2 12/12/2015   HCT 43.6 12/12/2015   MCV 94.8 12/12/2015   PLT 144 12/12/2015   NEUTROABS 4.0 12/12/2015   AST 17, ALT 12, bilirubin 1.22  Medications: I have reviewed the patient's current medications.  Assessment/Plan: 1. Pancreas cancer-presenting with obstructive jaundice ? CT 08/18/2015 with intra-and extrahepatic Dillard dilatation and dilatation of the pancreas duct, vague fullness at the head of the pancreas ? EUS 10/05/2015 confirmed a pancreas head mass with no evidence of vascular invasion and no peripancreatic adenopathy (uT2,uN0) sees ? EUS biopsy of the pancreas head mass on 10/05/2015 confirmed adenocarcinoma ? CT chest/[facility]domen at [facility] 11/16/2015-hypoenhancing mass in the head and uncinate process of the pancreas with increased marked pancreatic ductal dilatation and greater than 180 encasement of the superior mesenteric artery and superior mesenteric vein above the level of the first tributary.  2. Obstructive jaundice secondary to #1-status post placement of a bile duct stent  3. Hypertension    Disposition:  Mr. Pynes appears stable to begin chemotherapy today. We will arrange for an  appointment with the Cancer center nutritionist. He will return for an office visit and cycle 2 chemotherapy on 12/27/2015.  Betsy Coder, MD  12/12/2015  1:14 PM

## 2015-12-12 NOTE — Patient Instructions (Signed)
[facility] Discharge Instructions for Patients Receiving Chemotherapy  Today you received the following chemotherapy agents: Oxaliplatin, leucovorin, Adrucil   To help prevent nausea and vomiting after your treatment, we encourage you to take your nausea medication as directed.   If you develop nausea and vomiting that is not controlled by your nausea medication, call the clinic.   BELOW ARE SYMPTOMS THAT SHOULD BE REPORTED IMMEDIATELY:  *FEVER GREATER THAN 100.5 F  *CHILLS WITH OR WITHOUT FEVER  NAUSEA AND VOMITING THAT IS NOT CONTROLLED WITH YOUR NAUSEA MEDICATION  *UNUSUAL SHORTNESS OF BREATH  *UNUSUAL BRUISING OR BLEEDING  TENDERNESS IN MOUTH AND THROAT WITH OR WITHOUT PRESENCE OF ULCERS  *URINARY PROBLEMS  *BOWEL PROBLEMS  UNUSUAL RASH Items with * indicate a potential emergency and should be followed up as soon as possible.  Feel free to call the clinic you have any questions or concerns. The clinic phone number is [phone].  Please show the [place] at check-in to the Emergency Department and triage nurse.  Oxaliplatin Injection What is this medicine? OXALIPLATIN (ox AL i PLA tin) is a chemotherapy drug. It targets fast dividing cells, like cancer cells, and causes these cells to die. This medicine is used to treat cancers of the colon and rectum, and many other cancers. This medicine may be used for other purposes; ask your health care provider or pharmacist if you have questions. COMMON BRAND NAME(S): Eloxatin What should I tell my health care provider before I take this medicine? They need to know if you have any of these conditions: -kidney disease -an unusual or allergic reaction to oxaliplatin, other chemotherapy, other medicines, foods, dyes, or preservatives -pregnant or trying to get pregnant -breast-feeding How should I use this medicine? This drug is given as an infusion into a vein. It is administered in a hospital or  clinic by a specially trained health care professional. Talk to your pediatrician regarding the use of this medicine in children. Special care may be needed. Overdosage: If you think you have taken too much of this medicine contact a poison control center or emergency room at once. NOTE: This medicine is only for you. Do not share this medicine with others. What if I miss a dose? It is important not to miss a dose. Call your doctor or health care professional if you are unable to keep an appointment. What may interact with this medicine? -medicines to increase blood counts like filgrastim, pegfilgrastim, sargramostim -probenecid -some antibiotics like amikacin, gentamicin, neomycin, polymyxin B, streptomycin, tobramycin -zalcitabine Talk to your doctor or health care professional before taking any of these medicines: -acetaminophen -aspirin -ibuprofen -ketoprofen -naproxen This list may not describe all possible interactions. Give your health care provider a list of all the medicines, herbs, non-prescription drugs, or dietary supplements you use. Also tell them if you smoke, drink alcohol, or use illegal drugs. Some items may interact with your medicine. What should I watch for while using this medicine? Your condition will be monitored carefully while you are receiving this medicine. You will need important blood work done while you are taking this medicine. This medicine can make you more sensitive to cold. Do not drink cold drinks or use ice. Cover exposed skin before coming in contact with cold temperatures or cold objects. When out in cold weather wear warm clothing and cover your mouth and nose to warm the air that goes into your lungs. Tell your doctor if you get sensitive to the cold. This  drug may make you feel generally unwell. This is not uncommon, as chemotherapy can affect healthy cells as well as cancer cells. Report any side effects. Continue your course of treatment even though  you feel ill unless your doctor tells you to stop. In some cases, you may be given additional medicines to help with side effects. Follow all directions for their use. Call your doctor or health care professional for advice if you get a fever, chills or sore throat, or other symptoms of a cold or flu. Do not treat yourself. This drug decreases your body's ability to fight infections. Try to avoid being around people who are sick. This medicine may increase your risk to bruise or bleed. Call your doctor or health care professional if you notice any unusual bleeding. Be careful brushing and flossing your teeth or using a toothpick because you may get an infection or bleed more easily. If you have any dental work done, tell your dentist you are receiving this medicine. Avoid taking products that contain aspirin, acetaminophen, ibuprofen, naproxen, or ketoprofen unless instructed by your doctor. These medicines may hide a fever. Do not become pregnant while taking this medicine. Women should inform their doctor if they wish to become pregnant or think they might be pregnant. There is a potential for serious side effects to an unborn child. Talk to your health care professional or pharmacist for more information. Do not breast-feed an infant while taking this medicine. Call your doctor or health care professional if you get diarrhea. Do not treat yourself. What side effects may I notice from receiving this medicine? Side effects that you should report to your doctor or health care professional as soon as possible: -allergic reactions like skin rash, itching or hives, swelling of the face, lips, or tongue -low blood counts - This drug may decrease the number of white blood cells, red blood cells and platelets. You may be at increased risk for infections and bleeding. -signs of infection - fever or chills, cough, sore throat, pain or difficulty passing urine -signs of decreased platelets or bleeding -  bruising, pinpoint red spots on the skin, black, tarry stools, nosebleeds -signs of decreased red blood cells - unusually weak or tired, fainting spells, lightheadedness -breathing problems -chest pain, pressure -cough -diarrhea -jaw tightness -mouth sores -nausea and vomiting -pain, swelling, redness or irritation at the injection site -pain, tingling, numbness in the hands or feet -problems with balance, talking, walking -redness, blistering, peeling or loosening of the skin, including inside the mouth -trouble passing urine or change in the amount of urine Side effects that usually do not require medical attention (report to your doctor or health care professional if they continue or are bothersome): -changes in vision -constipation -hair loss -loss of appetite -metallic taste in the mouth or changes in taste -stomach pain This list may not describe all possible side effects. Call your doctor for medical advice about side effects. You may report side effects to FDA at 1-800-FDA-1088. Where should I keep my medicine? This drug is given in a hospital or clinic and will not be stored at home. NOTE: This sheet is a summary. It may not cover all possible information. If you have questions about this medicine, talk to your doctor, pharmacist, or health care provider.  2017 Elsevier/Gold Standard (2007-07-15 17:22:47)   Leucovorin injection What is this medicine? LEUCOVORIN (loo koe VOR in) is used to prevent or treat the harmful effects of some medicines. This medicine is used to treat  anemia caused by a low amount of folic acid in the body. It is also used with 5-fluorouracil (5-FU) to treat colon cancer. This medicine may be used for other purposes; ask your health care provider or pharmacist if you have questions. What should I tell my health care provider before I take this medicine? They need to know if you have any of these conditions: -anemia from low levels of vitamin B-12 in  the blood -an unusual or allergic reaction to leucovorin, folic acid, other medicines, foods, dyes, or preservatives -pregnant or trying to get pregnant -breast-feeding How should I use this medicine? This medicine is for injection into a muscle or into a vein. It is given by a health care professional in a hospital or clinic setting. Talk to your pediatrician regarding the use of this medicine in children. Special care may be needed. Overdosage: If you think you have taken too much of this medicine contact a poison control center or emergency room at once. NOTE: This medicine is only for you. Do not share this medicine with others. What if I miss a dose? This does not apply. What may interact with this medicine? -capecitabine -fluorouracil -phenobarbital -phenytoin -primidone -trimethoprim-sulfamethoxazole This list may not describe all possible interactions. Give your health care provider a list of all the medicines, herbs, non-prescription drugs, or dietary supplements you use. Also tell them if you smoke, drink alcohol, or use illegal drugs. Some items may interact with your medicine. What should I watch for while using this medicine? Your condition will be monitored carefully while you are receiving this medicine. This medicine may increase the side effects of 5-fluorouracil, 5-FU. Tell your doctor or health care professional if you have diarrhea or mouth sores that do not get better or that get worse. What side effects may I notice from receiving this medicine? Side effects that you should report to your doctor or health care professional as soon as possible: -allergic reactions like skin rash, itching or hives, swelling of the face, lips, or tongue -breathing problems -fever, infection -mouth sores -unusual bleeding or bruising -unusually weak or tired Side effects that usually do not require medical attention (report to your doctor or health care professional if they continue or  are bothersome): -constipation or diarrhea -loss of appetite -nausea, vomiting This list may not describe all possible side effects. Call your doctor for medical advice about side effects. You may report side effects to FDA at 1-800-FDA-1088. Where should I keep my medicine? This drug is given in a hospital or clinic and will not be stored at home. NOTE: This sheet is a summary. It may not cover all possible information. If you have questions about this medicine, talk to your doctor, pharmacist, or health care provider.  2017 Elsevier/Gold Standard (2007-06-24 16:50:29)   Fluorouracil, 5-FU injection What is this medicine? FLUOROURACIL, 5-FU (flure oh YOOR a sil) is a chemotherapy drug. It slows the growth of cancer cells. This medicine is used to treat many types of cancer like breast cancer, colon or rectal cancer, pancreatic cancer, and stomach cancer. This medicine may be used for other purposes; ask your health care provider or pharmacist if you have questions. COMMON BRAND NAME(S): Adrucil What should I tell my health care provider before I take this medicine? They need to know if you have any of these conditions: -blood disorders -dihydropyrimidine dehydrogenase (DPD) deficiency -infection (especially a virus infection such as chickenpox, cold sores, or herpes) -kidney disease -liver disease -malnourished, poor nutrition -recent  or ongoing radiation therapy -an unusual or allergic reaction to fluorouracil, other chemotherapy, other medicines, foods, dyes, or preservatives -pregnant or trying to get pregnant -breast-feeding How should I use this medicine? This drug is given as an infusion or injection into a vein. It is administered in a hospital or clinic by a specially trained health care professional. Talk to your pediatrician regarding the use of this medicine in children. Special care may be needed. Overdosage: If you think you have taken too much of this medicine contact a  poison control center or emergency room at once. NOTE: This medicine is only for you. Do not share this medicine with others. What if I miss a dose? It is important not to miss your dose. Call your doctor or health care professional if you are unable to keep an appointment. What may interact with this medicine? -allopurinol -cimetidine -dapsone -digoxin -hydroxyurea -leucovorin -levamisole -medicines for seizures like ethotoin, fosphenytoin, phenytoin -medicines to increase blood counts like filgrastim, pegfilgrastim, sargramostim -medicines that treat or prevent blood clots like warfarin, enoxaparin, and dalteparin -methotrexate -metronidazole -pyrimethamine -some other chemotherapy drugs like busulfan, cisplatin, estramustine, vinblastine -trimethoprim -trimetrexate -vaccines Talk to your doctor or health care professional before taking any of these medicines: -acetaminophen -aspirin -ibuprofen -ketoprofen -naproxen This list may not describe all possible interactions. Give your health care provider a list of all the medicines, herbs, non-prescription drugs, or dietary supplements you use. Also tell them if you smoke, drink alcohol, or use illegal drugs. Some items may interact with your medicine. What should I watch for while using this medicine? Visit your doctor for checks on your progress. This drug may make you feel generally unwell. This is not uncommon, as chemotherapy can affect healthy cells as well as cancer cells. Report any side effects. Continue your course of treatment even though you feel ill unless your doctor tells you to stop. In some cases, you may be given additional medicines to help with side effects. Follow all directions for their use. Call your doctor or health care professional for advice if you get a fever, chills or sore throat, or other symptoms of a cold or flu. Do not treat yourself. This drug decreases your body's ability to fight infections. Try to  avoid being around people who are sick. This medicine may increase your risk to bruise or bleed. Call your doctor or health care professional if you notice any unusual bleeding. Be careful brushing and flossing your teeth or using a toothpick because you may get an infection or bleed more easily. If you have any dental work done, tell your dentist you are receiving this medicine. Avoid taking products that contain aspirin, acetaminophen, ibuprofen, naproxen, or ketoprofen unless instructed by your doctor. These medicines may hide a fever. Do not become pregnant while taking this medicine. Women should inform their doctor if they wish to become pregnant or think they might be pregnant. There is a potential for serious side effects to an unborn child. Talk to your health care professional or pharmacist for more information. Do not breast-feed an infant while taking this medicine. Men should inform their doctor if they wish to father a child. This medicine may lower sperm counts. Do not treat diarrhea with over the counter products. Contact your doctor if you have diarrhea that lasts more than 2 days or if it is severe and watery. This medicine can make you more sensitive to the sun. Keep out of the sun. If you cannot avoid being in the   sun, wear protective clothing and use sunscreen. Do not use sun lamps or tanning beds/booths. What side effects may I notice from receiving this medicine? Side effects that you should report to your doctor or health care professional as soon as possible: -allergic reactions like skin rash, itching or hives, swelling of the face, lips, or tongue -low blood counts - this medicine may decrease the number of white blood cells, red blood cells and platelets. You may be at increased risk for infections and bleeding. -signs of infection - fever or chills, cough, sore throat, pain or difficulty passing urine -signs of decreased platelets or bleeding - bruising, pinpoint red spots  on the skin, black, tarry stools, blood in the urine -signs of decreased red blood cells - unusually weak or tired, fainting spells, lightheadedness -breathing problems -changes in vision -chest pain -mouth sores -nausea and vomiting -pain, swelling, redness at site where injected -pain, tingling, numbness in the hands or feet -redness, swelling, or sores on hands or feet -stomach pain -unusual bleeding Side effects that usually do not require medical attention (report to your doctor or health care professional if they continue or are bothersome): -changes in finger or toe nails -diarrhea -dry or itchy skin -hair loss -headache -loss of appetite -sensitivity of eyes to the light -stomach upset -unusually teary eyes This list may not describe all possible side effects. Call your doctor for medical advice about side effects. You may report side effects to FDA at 1-800-FDA-1088. Where should I keep my medicine? This drug is given in a hospital or clinic and will not be stored at home. NOTE: This sheet is a summary. It may not cover all possible information. If you have questions about this medicine, talk to your doctor, pharmacist, or health care provider.  2017 Elsevier/Gold Standard (2007-04-23 13:53:16)

## 2015-12-14 ENCOUNTER — Ambulatory Visit (HOSPITAL_BASED_OUTPATIENT_CLINIC_OR_DEPARTMENT_OTHER): Payer: Medicare Other

## 2015-12-14 VITALS — BP 154/77 | HR 77 | Temp 98.5°F | Resp 18

## 2015-12-14 DIAGNOSIS — C250 Malignant neoplasm of head of pancreas: Secondary | ICD-10-CM

## 2015-12-14 DIAGNOSIS — Z452 Encounter for adjustment and management of vascular access device: Secondary | ICD-10-CM | POA: Diagnosis not present

## 2015-12-14 LAB — CANCER ANTIGEN 19-9

## 2015-12-14 MED ORDER — SODIUM CHLORIDE 0.9% FLUSH
10.0000 mL | INTRAVENOUS | Status: DC | PRN
  Administered 2015-12-14: 10 mL
  Filled 2015-12-14: qty 10

## 2015-12-14 MED ORDER — HEPARIN SOD (PORK) LOCK FLUSH 100 UNIT/ML IV SOLN
500.0000 [IU] | Freq: Once | INTRAVENOUS | Status: AC | PRN
  Administered 2015-12-14: 500 [IU]
  Filled 2015-12-14: qty 5

## 2015-12-15 ENCOUNTER — Telehealth: Payer: Self-pay | Admitting: General Practice

## 2015-12-15 NOTE — Telephone Encounter (Signed)
Left msg regarding Jan 10, 2016 appts.

## 2015-12-23 ENCOUNTER — Telehealth: Payer: Self-pay | Admitting: Oncology

## 2015-12-23 NOTE — Telephone Encounter (Signed)
Added f/u to 12/26 appointments. Left message for patient re add on and new time for 12/26 @ 11:45 am.

## 2015-12-26 ENCOUNTER — Other Ambulatory Visit: Payer: Self-pay | Admitting: Oncology

## 2015-12-27 ENCOUNTER — Ambulatory Visit (HOSPITAL_BASED_OUTPATIENT_CLINIC_OR_DEPARTMENT_OTHER): Payer: Medicare Other

## 2015-12-27 ENCOUNTER — Ambulatory Visit: Payer: Medicare Other

## 2015-12-27 ENCOUNTER — Ambulatory Visit (HOSPITAL_BASED_OUTPATIENT_CLINIC_OR_DEPARTMENT_OTHER): Payer: Medicare Other | Admitting: Oncology

## 2015-12-27 ENCOUNTER — Other Ambulatory Visit (HOSPITAL_BASED_OUTPATIENT_CLINIC_OR_DEPARTMENT_OTHER): Payer: Medicare Other

## 2015-12-27 VITALS — BP 137/76 | HR 88 | Temp 98.4°F | Resp 18 | Ht 75.0 in | Wt 196.5 lb

## 2015-12-27 DIAGNOSIS — D701 Agranulocytosis secondary to cancer chemotherapy: Secondary | ICD-10-CM | POA: Diagnosis not present

## 2015-12-27 DIAGNOSIS — C250 Malignant neoplasm of head of pancreas: Secondary | ICD-10-CM | POA: Diagnosis not present

## 2015-12-27 DIAGNOSIS — Z5111 Encounter for antineoplastic chemotherapy: Secondary | ICD-10-CM

## 2015-12-27 DIAGNOSIS — I10 Essential (primary) hypertension: Secondary | ICD-10-CM

## 2015-12-27 DIAGNOSIS — R634 Abnormal weight loss: Secondary | ICD-10-CM

## 2015-12-27 DIAGNOSIS — Z95828 Presence of other vascular implants and grafts: Secondary | ICD-10-CM

## 2015-12-27 DIAGNOSIS — R630 Anorexia: Secondary | ICD-10-CM | POA: Diagnosis not present

## 2015-12-27 LAB — CBC WITH DIFFERENTIAL/PLATELET
BASO%: 0.4 % (ref 0.0–2.0)
Basophils Absolute: 0 10*3/uL (ref 0.0–0.1)
EOS ABS: 0 10*3/uL (ref 0.0–0.5)
EOS%: 1.5 % (ref 0.0–7.0)
HCT: 40.8 % (ref 38.4–49.9)
HEMOGLOBIN: 13.6 g/dL (ref 13.0–17.1)
LYMPH%: 45.6 % (ref 14.0–49.0)
MCH: 32.1 pg (ref 27.2–33.4)
MCHC: 33.3 g/dL (ref 32.0–36.0)
MCV: 96.5 fL (ref 79.3–98.0)
MONO#: 0.3 10*3/uL (ref 0.1–0.9)
MONO%: 10.9 % (ref 0.0–14.0)
NEUT%: 41.6 % (ref 39.0–75.0)
NEUTROS ABS: 1.3 10*3/uL — AB (ref 1.5–6.5)
PLATELETS: 150 10*3/uL (ref 140–400)
RBC: 4.23 10*6/uL (ref 4.20–5.82)
RDW: 14.5 % (ref 11.0–14.6)
WBC: 3 10*3/uL — AB (ref 4.0–10.3)
lymph#: 1.4 10*3/uL (ref 0.9–3.3)

## 2015-12-27 LAB — COMPREHENSIVE METABOLIC PANEL
ALBUMIN: 3.4 g/dL — AB (ref 3.5–5.0)
ALK PHOS: 101 U/L (ref 40–150)
ALT: 14 U/L (ref 0–55)
AST: 19 U/L (ref 5–34)
Anion Gap: 10 mEq/L (ref 3–11)
BILIRUBIN TOTAL: 1.09 mg/dL (ref 0.20–1.20)
BUN: 8.1 mg/dL (ref 7.0–26.0)
CO2: 25 meq/L (ref 22–29)
CREATININE: 0.8 mg/dL (ref 0.7–1.3)
Calcium: 9.4 mg/dL (ref 8.4–10.4)
Chloride: 108 mEq/L (ref 98–109)
GLUCOSE: 98 mg/dL (ref 70–140)
Potassium: 3.4 mEq/L — ABNORMAL LOW (ref 3.5–5.1)
SODIUM: 143 meq/L (ref 136–145)
TOTAL PROTEIN: 7.1 g/dL (ref 6.4–8.3)

## 2015-12-27 MED ORDER — PALONOSETRON HCL INJECTION 0.25 MG/5ML
INTRAVENOUS | Status: AC
Start: 2015-12-27 — End: 2015-12-27
  Filled 2015-12-27: qty 5

## 2015-12-27 MED ORDER — OXALIPLATIN CHEMO INJECTION 100 MG/20ML
85.0000 mg/m2 | Freq: Once | INTRAVENOUS | Status: AC
  Administered 2015-12-27: 185 mg via INTRAVENOUS
  Filled 2015-12-27: qty 37

## 2015-12-27 MED ORDER — SODIUM CHLORIDE 0.9 % IV SOLN
2400.0000 mg/m2 | INTRAVENOUS | Status: DC
  Administered 2015-12-27: 5250 mg via INTRAVENOUS
  Filled 2015-12-27: qty 105

## 2015-12-27 MED ORDER — LEUCOVORIN CALCIUM INJECTION 350 MG
400.0000 mg/m2 | Freq: Once | INTRAVENOUS | Status: AC
  Administered 2015-12-27: 876 mg via INTRAVENOUS
  Filled 2015-12-27: qty 43.8

## 2015-12-27 MED ORDER — DEXTROSE 5 % IV SOLN
Freq: Once | INTRAVENOUS | Status: AC
  Administered 2015-12-27: 14:00:00 via INTRAVENOUS

## 2015-12-27 MED ORDER — SODIUM CHLORIDE 0.9% FLUSH
10.0000 mL | INTRAVENOUS | Status: DC | PRN
  Filled 2015-12-27: qty 10

## 2015-12-27 MED ORDER — SODIUM CHLORIDE 0.9% FLUSH
10.0000 mL | INTRAVENOUS | Status: DC | PRN
  Administered 2015-12-27: 10 mL via INTRAVENOUS
  Filled 2015-12-27: qty 10

## 2015-12-27 MED ORDER — FLUOROURACIL CHEMO INJECTION 2.5 GM/50ML
400.0000 mg/m2 | Freq: Once | INTRAVENOUS | Status: AC
  Administered 2015-12-27: 900 mg via INTRAVENOUS
  Filled 2015-12-27: qty 18

## 2015-12-27 MED ORDER — DEXAMETHASONE SODIUM PHOSPHATE 10 MG/ML IJ SOLN
10.0000 mg | Freq: Once | INTRAMUSCULAR | Status: AC
  Administered 2015-12-27: 10 mg via INTRAVENOUS

## 2015-12-27 MED ORDER — DEXAMETHASONE SODIUM PHOSPHATE 10 MG/ML IJ SOLN
INTRAMUSCULAR | Status: AC
  Filled 2015-12-27: qty 1

## 2015-12-27 MED ORDER — PALONOSETRON HCL INJECTION 0.25 MG/5ML
0.2500 mg | Freq: Once | INTRAVENOUS | Status: AC
  Administered 2015-12-27: 0.25 mg via INTRAVENOUS

## 2015-12-27 NOTE — Progress Notes (Signed)
  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 He completed a first cycle of FOLFOX on 12/12/2015. No nausea/vomiting, mouth sores, or diarrhea. He had "salivary gland "discomfort for several days following chemotherapy. No neuropathy symptoms. He reports anorexia. No pain.  Objective:  Vital signs in last 24 hours:  Blood pressure 137/76, pulse 88, temperature 98.4 F (36.9 C), temperature source Oral, resp. rate 18, height 6\' 3"  (1.905 m), weight 196 lb 8 oz (89.1 kg), SpO2 100 %.    HEENT: No thrush or ulcers Resp: Lungs clear bilaterally Cardio: Regular rate and rhythm GI: No hepatomegaly, no mass, nontender Vascular: No leg edema   Portacath/PICC-without erythema  Lab Results:  Lab Results  Component Value Date   WBC 3.0 (L) 12/27/2015   HGB 13.6 12/27/2015   HCT 40.8 12/27/2015   MCV 96.5 12/27/2015   PLT 150 12/27/2015   NEUTROABS 1.3 (L) 12/27/2015     Medications: I have reviewed the patient's current medications.  Assessment/Plan: 1. Pancreas cancer-presenting with obstructive jaundice ? CT 08/18/2015 with intra-and extrahepatic Dillard dilatation and dilatation of the pancreas duct, vague fullness at the head of the pancreas ? EUS 10/05/2015 confirmed a pancreas head mass with no evidence of vascular invasion and no peripancreatic adenopathy (uT2,uN0) sees ? EUS biopsy of the pancreas head mass on 10/05/2015 confirmed adenocarcinoma ? CT [facility] 11/16/2015-hypoenhancing mass in the head and uncinate process of the pancreas with increased markedpancreatic ductal dilatation and greater than 180 encasement of the superior mesenteric artery and superior mesenteric vein above the level of the first tributary. ? Cycle 1 FOLFOX 12/12/2015 ? Cycle 2 FOLFOX 12/26/2013-Neulasta added  2. Obstructive jaundice secondary to #1-status postplacement of a bile duct  stent  3. Hypertension  4.    Anorexia/weight loss secondary to #1-we will arrange for an appointment with the Cancer center nutritionist. He will begin a nutrition supplement.    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 He tolerated chemotherapy well. He has mild neutropenia today. We discussed the risk/benefit of delaying chemotherapy versus proceeding with the addition of Neulasta. We reviewed the potential toxicities associated with Neulasta. The plan is to complete cycle 2 FOLFOX today with Neulasta support.  We will arrange for an appointment with the Cancer center nutritionist. The plan is to complete 5-6 cycles of chemotherapy prior to a restaging CT evaluation. We will add irinotecan beginning with cycle 3 if he continues to tolerate the chemotherapy well.  Betsy Coder, MD  12/27/2015  1:19 PM

## 2015-12-27 NOTE — Patient Instructions (Signed)
[facility] Discharge Instructions for Patients Receiving Chemotherapy  Today you received the following chemotherapy agents: Oxaliplatin, leucovorin, Adrucil   To help prevent nausea and vomiting after your treatment, we encourage you to take your nausea medication as directed.   If you develop nausea and vomiting that is not controlled by your nausea medication, call the clinic.   BELOW ARE SYMPTOMS THAT SHOULD BE REPORTED IMMEDIATELY:  *FEVER GREATER THAN 100.5 F  *CHILLS WITH OR WITHOUT FEVER  NAUSEA AND VOMITING THAT IS NOT CONTROLLED WITH YOUR NAUSEA MEDICATION  *UNUSUAL SHORTNESS OF BREATH  *UNUSUAL BRUISING OR BLEEDING  TENDERNESS IN MOUTH AND THROAT WITH OR WITHOUT PRESENCE OF ULCERS  *URINARY PROBLEMS  *BOWEL PROBLEMS  UNUSUAL RASH Items with * indicate a potential emergency and should be followed up as soon as possible.  Feel free to call the clinic you have any questions or concerns. The clinic phone number is [phone].  Please show the [place] at check-in to the Emergency Department and triage nurse.

## 2015-12-27 NOTE — Progress Notes (Signed)
OK to treat with ANC-1.3 per Dr. Sherrill.  

## 2015-12-27 NOTE — Progress Notes (Signed)
12/27/2015 Per Dr. Benay Spice ok to treat with ANC 1.3

## 2015-12-27 NOTE — Patient Instructions (Signed)

## 2015-12-29 ENCOUNTER — Ambulatory Visit (HOSPITAL_BASED_OUTPATIENT_CLINIC_OR_DEPARTMENT_OTHER): Payer: Medicare Other

## 2015-12-29 VITALS — BP 139/70 | HR 83 | Temp 98.2°F | Resp 18

## 2015-12-29 DIAGNOSIS — Z452 Encounter for adjustment and management of vascular access device: Secondary | ICD-10-CM | POA: Diagnosis not present

## 2015-12-29 DIAGNOSIS — C250 Malignant neoplasm of head of pancreas: Secondary | ICD-10-CM | POA: Diagnosis not present

## 2015-12-29 MED ORDER — HEPARIN SOD (PORK) LOCK FLUSH 100 UNIT/ML IV SOLN
500.0000 [IU] | Freq: Once | INTRAVENOUS | Status: AC | PRN
  Administered 2015-12-29: 500 [IU]
  Filled 2015-12-29: qty 5

## 2015-12-29 MED ORDER — SODIUM CHLORIDE 0.9% FLUSH
10.0000 mL | INTRAVENOUS | Status: DC | PRN
  Administered 2015-12-29: 10 mL
  Filled 2015-12-29: qty 10

## 2016-01-03 ENCOUNTER — Telehealth: Payer: Self-pay | Admitting: Oncology

## 2016-01-03 NOTE — Telephone Encounter (Signed)
Left message re 1/9 appointment. Patient to get new schedule 1/9.

## 2016-01-08 ENCOUNTER — Other Ambulatory Visit: Payer: Self-pay | Admitting: Oncology

## 2016-01-10 ENCOUNTER — Other Ambulatory Visit (HOSPITAL_BASED_OUTPATIENT_CLINIC_OR_DEPARTMENT_OTHER): Payer: Medicare Other

## 2016-01-10 ENCOUNTER — Ambulatory Visit (HOSPITAL_BASED_OUTPATIENT_CLINIC_OR_DEPARTMENT_OTHER): Payer: Medicare Other

## 2016-01-10 ENCOUNTER — Ambulatory Visit: Payer: Medicare Other

## 2016-01-10 ENCOUNTER — Ambulatory Visit (HOSPITAL_BASED_OUTPATIENT_CLINIC_OR_DEPARTMENT_OTHER): Payer: Medicare Other | Admitting: Nurse Practitioner

## 2016-01-10 ENCOUNTER — Other Ambulatory Visit: Payer: Self-pay | Admitting: *Deleted

## 2016-01-10 VITALS — BP 106/61 | HR 98 | Temp 98.4°F | Resp 18 | Wt 192.2 lb

## 2016-01-10 DIAGNOSIS — C250 Malignant neoplasm of head of pancreas: Secondary | ICD-10-CM

## 2016-01-10 DIAGNOSIS — D701 Agranulocytosis secondary to cancer chemotherapy: Secondary | ICD-10-CM | POA: Diagnosis not present

## 2016-01-10 DIAGNOSIS — I10 Essential (primary) hypertension: Secondary | ICD-10-CM | POA: Diagnosis not present

## 2016-01-10 LAB — COMPREHENSIVE METABOLIC PANEL
ALT: 17 U/L (ref 0–55)
ANION GAP: 10 meq/L (ref 3–11)
AST: 23 U/L (ref 5–34)
Albumin: 3.2 g/dL — ABNORMAL LOW (ref 3.5–5.0)
Alkaline Phosphatase: 111 U/L (ref 40–150)
BILIRUBIN TOTAL: 1.2 mg/dL (ref 0.20–1.20)
BUN: 11.1 mg/dL (ref 7.0–26.0)
CO2: 24 meq/L (ref 22–29)
CREATININE: 0.9 mg/dL (ref 0.7–1.3)
Calcium: 9.4 mg/dL (ref 8.4–10.4)
Chloride: 107 mEq/L (ref 98–109)
EGFR: >90 mL/min/{1.73_m2} (ref >90)
Glucose: 98 mg/dl (ref 70–140)
Potassium: 3.7 mEq/L (ref 3.5–5.1)
Sodium: 141 mEq/L (ref 136–145)
TOTAL PROTEIN: 6.7 g/dL (ref 6.4–8.3)

## 2016-01-10 LAB — CBC WITH DIFFERENTIAL/PLATELET
BASO%: 0 % (ref 0.0–2.0)
Basophils Absolute: 0 10*3/uL (ref 0.0–0.1)
EOS%: 0.6 % (ref 0.0–7.0)
Eosinophils Absolute: 0 10*3/uL (ref 0.0–0.5)
HEMATOCRIT: 38.7 % (ref 38.4–49.9)
HGB: 13.1 g/dL (ref 13.0–17.1)
LYMPH#: 1.1 10*3/uL (ref 0.9–3.3)
LYMPH%: 64.9 % — AB (ref 14.0–49.0)
MCH: 31.6 pg (ref 27.2–33.4)
MCHC: 33.9 g/dL (ref 32.0–36.0)
MCV: 93.3 fL (ref 79.3–98.0)
MONO#: 0.2 10*3/uL (ref 0.1–0.9)
MONO%: 10.3 % (ref 0.0–14.0)
NEUT%: 24.2 % — ABNORMAL LOW (ref 39.0–75.0)
NEUTROS ABS: 0.4 10*3/uL — AB (ref 1.5–6.5)
PLATELETS: 93 10*3/uL — AB (ref 140–400)
RBC: 4.15 10*6/uL — AB (ref 4.20–5.82)
RDW: 15.4 % — AB (ref 11.0–14.6)
WBC: 1.7 10*3/uL — AB (ref 4.0–10.3)

## 2016-01-10 MED ORDER — SODIUM CHLORIDE 0.9 % IV SOLN
INTRAVENOUS | Status: AC
  Administered 2016-01-10: 11:00:00 via INTRAVENOUS

## 2016-01-10 MED ORDER — SODIUM CHLORIDE 0.9% FLUSH
10.0000 mL | INTRAVENOUS | Status: AC | PRN
  Administered 2016-01-10: 10 mL
  Filled 2016-01-10: qty 10

## 2016-01-10 MED ORDER — HEPARIN SOD (PORK) LOCK FLUSH 100 UNIT/ML IV SOLN
500.0000 [IU] | INTRAVENOUS | Status: AC | PRN
  Administered 2016-01-10: 500 [IU]
  Filled 2016-01-10: qty 5

## 2016-01-10 NOTE — Patient Instructions (Signed)

## 2016-01-10 NOTE — Patient Instructions (Signed)
Dehydration, Adult Dehydration is a condition in which there is not enough fluid or water in the body. This happens when you lose more fluids than you take in. Important organs, such as the kidneys, brain, and heart, cannot function without a proper amount of fluids. Any loss of fluids from the body can lead to dehydration. Dehydration can range from mild to severe. This condition should be treated right away to prevent it from becoming severe. What are the causes? This condition may be caused by:  Vomiting.  Diarrhea.  Excessive sweating, such as from heat exposure or exercise.  Not drinking enough fluid, especially:  When ill.  While doing activity that requires a lot of energy.  Excessive urination.  Fever.  Infection.  Certain medicines, such as medicines that cause the body to lose excess fluid (diuretics).  Inability to access safe drinking water.  Reduced physical ability to get adequate water and food. What increases the risk? This condition is more likely to develop in people:  Who have a poorly controlled long-term (chronic) illness, such as diabetes, heart disease, or kidney disease.  Who are age 65 or older.  Who are disabled.  Who live in a place with high altitude.  Who play endurance sports. What are the signs or symptoms? Symptoms of mild dehydration may include:   Thirst.  Dry lips.  Slightly dry mouth.  Dry, warm skin.  Dizziness. Symptoms of moderate dehydration may include:   Very dry mouth.  Muscle cramps.  Dark urine. Urine may be the color of tea.  Decreased urine production.  Decreased tear production.  Heartbeat that is irregular or faster than normal (palpitations).  Headache.  Light-headedness, especially when you stand up from a sitting position.  Fainting (syncope). Symptoms of severe dehydration may include:   Changes in skin, such as:  Cold and clammy skin.  Blotchy (mottled) or pale skin.  Skin that does  not quickly return to normal after being lightly pinched and released (poor skin turgor).  Changes in body fluids, such as:  Extreme thirst.  No tear production.  Inability to sweat when body temperature is high, such as in hot weather.  Very little urine production.  Changes in vital signs, such as:  Weak pulse.  Pulse that is more than 100 beats a minute when sitting still.  Rapid breathing.  Low blood pressure.  Other changes, such as:  Sunken eyes.  Cold hands and feet.  Confusion.  Lack of energy (lethargy).  Difficulty waking up from sleep.  Short-term weight loss.  Unconsciousness. How is this diagnosed? This condition is diagnosed based on your symptoms and a physical exam. Blood and urine tests may be done to help confirm the diagnosis. How is this treated? Treatment for this condition depends on the severity. Mild or moderate dehydration can often be treated at home. Treatment should be started right away. Do not wait until dehydration becomes severe. Severe dehydration is an emergency and it needs to be treated in a hospital. Treatment for mild dehydration may include:   Drinking more fluids.  Replacing salts and minerals in your blood (electrolytes) that you may have lost. Treatment for moderate dehydration may include:   Drinking an oral rehydration solution (ORS). This is a drink that helps you replace fluids and electrolytes (rehydrate). It can be found at pharmacies and retail stores. Treatment for severe dehydration may include:   Receiving fluids through an IV tube.  Receiving an electrolyte solution through a feeding tube that is   passed through your nose and into your stomach (nasogastric tube, or NG tube).  Correcting any abnormalities in electrolytes.  Treating the underlying cause of dehydration. Follow these instructions at home:  If directed by your health care provider, drink an ORS:  Make an ORS by following instructions on the  package.  Start by drinking small amounts, about  cup (120 mL) every 5-10 minutes.  Slowly increase how much you drink until you have taken the amount recommended by your health care provider.  Drink enough clear fluid to keep your urine clear or pale yellow. If you were told to drink an ORS, finish the ORS first, then start slowly drinking other clear fluids. Drink fluids such as:  Water. Do not drink only water. Doing that can lead to having too little salt (sodium) in the body (hyponatremia).  Ice chips.  Fruit juice that you have added water to (diluted fruit juice).  Low-calorie sports drinks.  Avoid:  Alcohol.  Drinks that contain a lot of sugar. These include high-calorie sports drinks, fruit juice that is not diluted, and soda.  Caffeine.  Foods that are greasy or contain a lot of fat or sugar.  Take over-the-counter and prescription medicines only as told by your health care provider.  Do not take sodium tablets. This can lead to having too much sodium in the body (hypernatremia).  Eat foods that contain a healthy balance of electrolytes, such as bananas, oranges, potatoes, tomatoes, and spinach.  Keep all follow-up visits as told by your health care provider. This is important. Contact a health care provider if:  You have abdominal pain that:  Gets worse.  Stays in one area (localizes).  You have a rash.  You have a stiff neck.  You are more irritable than usual.  You are sleepier or more difficult to wake up than usual.  You feel weak or dizzy.  You feel very thirsty.  You have urinated only a small amount of very dark urine over 6-8 hours. Get help right away if:  You have symptoms of severe dehydration.  You cannot drink fluids without vomiting.  Your symptoms get worse with treatment.  You have a fever.  You have a severe headache.  You have vomiting or diarrhea that:  Gets worse.  Does not go away.  You have blood or green matter  (bile) in your vomit.  You have blood in your stool. This may cause stool to look black and tarry.  You have not urinated in 6-8 hours.  You faint.  Your heart rate while sitting still is over 100 beats a minute.  You have trouble breathing. This information is not intended to replace advice given to you by your health care provider. Make sure you discuss any questions you have with your health care provider. Document Released: 12/18/2004 Document Revised: 07/15/2015 Document Reviewed: 02/11/2015 Elsevier Interactive Patient Education  2017 [employer]  

## 2016-01-10 NOTE — Progress Notes (Addendum)
[place] OFFICE PROGRESS NOTE   Diagnosis:  Pancreas cancer  INTERVAL HISTORY:   Mr. Kawecki returns as scheduled. He completed cycle 2 FOLFOX 12/27/2015. He does not recall receiving the Neulasta injection on the day of pump discontinuation. No nausea or vomiting. No mouth sores. No diarrhea. Cold sensitivity lasted 5 or 6 days. He feels tired. He is experiencing lightheadedness with position change. No fever. No chills. No shortness of breath. No cough. His wife reports fluid intake has been suboptimal.  Objective:  Vital signs in last 24 hours:  Blood pressure 106/61, pulse 98, temperature 98.4 F (36.9 C), temperature source Oral, resp. rate 18, weight 192 lb 3.2 oz (87.2 kg), SpO2 99 %.    HEENT: No thrush or ulcers. Resp: Lungs clear bilaterally. Cardio: Regular rate and rhythm. GI: Abdomen soft and nontender. No hepatomegaly. No mass. Vascular: No leg edema. Port-A-Cath without erythema.  Lab Results:  Lab Results  Component Value Date   WBC 1.7 (L) 01/10/2016   HGB 13.1 01/10/2016   HCT 38.7 01/10/2016   MCV 93.3 01/10/2016   PLT 93 (L) 01/10/2016   NEUTROABS 0.4 (LL) 01/10/2016    Imaging:  No results found.  Medications: I have reviewed the patient's current medications.  Assessment/Plan: 1. Pancreas cancer-presenting with obstructive jaundice ? CT 08/18/2015 with intra-and extrahepatic Dillard dilatation and dilatation of the pancreas duct, vague fullness at the head of the pancreas ? EUS 10/05/2015 confirmed a pancreas head mass with no evidence of vascular invasion and no peripancreatic adenopathy (uT2,uN0) sees ? EUS biopsy of the pancreas head mass on 10/05/2015 confirmed adenocarcinoma ? CT chest/abdomen at [facility] 11/16/2015-hypoenhancing mass in the head and uncinate process of the pancreas with increased markedpancreatic ductal dilatation and greater than 180 encasement of the superior mesenteric artery and superior mesenteric vein  above the level of the first tributary. ? Cycle 1 FOLFOX 12/12/2015 ? Cycle 2 FOLFOX 12/26/2013  2. Obstructive jaundice secondary to #1-status postplacement of a bile duct stent  3. Hypertension  4.    Anorexia/weight loss secondary to #1-we will arrange for an appointment with the Cancer center nutritionist. He will begin a nutrition supplement.  5.    Neutropenia secondary to chemotherapy 01/10/2016   Disposition:Mr. Wehri has completed 2 cycles of FOLFOX. It appears he did not receive Neulasta following cycle 2 on the day of pump discontinuation. On labs today he is neutropenic. We are holding today's treatment. We discussed rescheduling cycle 3 until next week. He prefers to return for cycle 3 in 2 weeks.  He is having lightheadedness with position change. He will hold his blood pressure medication. We will administer a liter of IV fluids in the office today. He will increase fluid intake orally.  He will return for a follow-up visit and cycle 3 FOLFOX in 2 weeks. He will receive Neulasta on the day of pump discontinuation. He will contact the office in the interim with any problems. We specifically discussed neutropenic and thrombocytopenic precautions.  Patient seen with Dr. Benay Spice. 25 minutes were spent face-to-face at today's visit with the majority of that time involved in counseling/coordination of care.  Ned Card ANP/GNP-BC   01/10/2016  10:26 AM  This was a shared visit with Ned Card. Mr. Laba has completed 2 cycles of FOLFOX. He is neutropenic today. He did not receive Neulasta as ordered with cycle 2. Chemotherapy will be held today. His performance status remains borderline. This is likely secondary to the locally advanced pancreas cancer  in addition to toxicity from chemotherapy. He knows to contact us for a fever. We encouraged him to increase his diet.  Julieanne Manson, M.D.

## 2016-01-11 ENCOUNTER — Telehealth: Payer: Self-pay | Admitting: Oncology

## 2016-01-11 LAB — CANCER ANTIGEN 19-9: CA 19-9: 1031 U/mL — ABNORMAL HIGH (ref 0–35)

## 2016-01-11 NOTE — Telephone Encounter (Signed)
Per 1/9 los - cxd 1/11 appointments. Left message for patient and confirmed next appointment for 1/23.

## 2016-01-12 ENCOUNTER — Ambulatory Visit: Payer: Medicare Other

## 2016-01-12 ENCOUNTER — Encounter: Payer: Medicare Other | Admitting: Nutrition

## 2016-01-16 ENCOUNTER — Encounter: Payer: Self-pay | Admitting: Oncology

## 2016-01-16 NOTE — Progress Notes (Signed)
Called pt and left a message that FMLA paperwork was ready for p/u at reception desk

## 2016-01-22 ENCOUNTER — Other Ambulatory Visit: Payer: Self-pay | Admitting: Oncology

## 2016-01-24 ENCOUNTER — Ambulatory Visit (HOSPITAL_BASED_OUTPATIENT_CLINIC_OR_DEPARTMENT_OTHER): Payer: Medicare Other

## 2016-01-24 ENCOUNTER — Ambulatory Visit (HOSPITAL_BASED_OUTPATIENT_CLINIC_OR_DEPARTMENT_OTHER): Payer: Medicare Other | Admitting: Oncology

## 2016-01-24 ENCOUNTER — Other Ambulatory Visit (HOSPITAL_BASED_OUTPATIENT_CLINIC_OR_DEPARTMENT_OTHER): Payer: Medicare Other

## 2016-01-24 ENCOUNTER — Ambulatory Visit: Payer: Medicare Other

## 2016-01-24 VITALS — BP 135/79 | HR 92 | Temp 97.6°F | Resp 18 | Ht 75.0 in | Wt 196.1 lb

## 2016-01-24 DIAGNOSIS — C250 Malignant neoplasm of head of pancreas: Secondary | ICD-10-CM

## 2016-01-24 DIAGNOSIS — I10 Essential (primary) hypertension: Secondary | ICD-10-CM

## 2016-01-24 DIAGNOSIS — Z5111 Encounter for antineoplastic chemotherapy: Secondary | ICD-10-CM | POA: Diagnosis not present

## 2016-01-24 DIAGNOSIS — D701 Agranulocytosis secondary to cancer chemotherapy: Secondary | ICD-10-CM

## 2016-01-24 LAB — COMPREHENSIVE METABOLIC PANEL
ALBUMIN: 2.9 g/dL — AB (ref 3.5–5.0)
ALK PHOS: 119 U/L (ref 40–150)
ALT: 9 U/L (ref 0–55)
ANION GAP: 10 meq/L (ref 3–11)
AST: 17 U/L (ref 5–34)
BILIRUBIN TOTAL: 0.88 mg/dL (ref 0.20–1.20)
BUN: 9.8 mg/dL (ref 7.0–26.0)
CALCIUM: 9.3 mg/dL (ref 8.4–10.4)
CO2: 25 mEq/L (ref 22–29)
CREATININE: 0.8 mg/dL (ref 0.7–1.3)
Chloride: 108 mEq/L (ref 98–109)
EGFR: >90 mL/min/{1.73_m2} (ref >90)
Glucose: 92 mg/dl (ref 70–140)
Potassium: 3.8 mEq/L (ref 3.5–5.1)
Sodium: 143 mEq/L (ref 136–145)
TOTAL PROTEIN: 6.7 g/dL (ref 6.4–8.3)

## 2016-01-24 LAB — CBC WITH DIFFERENTIAL/PLATELET
BASO%: 0.6 % (ref 0.0–2.0)
Basophils Absolute: 0 10*3/uL (ref 0.0–0.1)
EOS%: 0.2 % (ref 0.0–7.0)
Eosinophils Absolute: 0 10*3/uL (ref 0.0–0.5)
HEMATOCRIT: 39.4 % (ref 38.4–49.9)
HEMOGLOBIN: 13.3 g/dL (ref 13.0–17.1)
LYMPH#: 1.6 10*3/uL (ref 0.9–3.3)
LYMPH%: 27.7 % (ref 14.0–49.0)
MCH: 32.5 pg (ref 27.2–33.4)
MCHC: 33.7 g/dL (ref 32.0–36.0)
MCV: 96.4 fL (ref 79.3–98.0)
MONO#: 0.6 10*3/uL (ref 0.1–0.9)
MONO%: 11.4 % (ref 0.0–14.0)
NEUT%: 60.1 % (ref 39.0–75.0)
NEUTROS ABS: 3.4 10*3/uL (ref 1.5–6.5)
PLATELETS: 179 10*3/uL (ref 140–400)
RBC: 4.09 10*6/uL — ABNORMAL LOW (ref 4.20–5.82)
RDW: 16.5 % — AB (ref 11.0–14.6)
WBC: 5.6 10*3/uL (ref 4.0–10.3)

## 2016-01-24 MED ORDER — SODIUM CHLORIDE 0.9 % IV SOLN
Freq: Once | INTRAVENOUS | Status: AC
  Administered 2016-01-24: 11:00:00 via INTRAVENOUS
  Filled 2016-01-24: qty 5

## 2016-01-24 MED ORDER — SODIUM CHLORIDE 0.9% FLUSH
10.0000 mL | INTRAVENOUS | Status: DC | PRN
  Filled 2016-01-24: qty 10

## 2016-01-24 MED ORDER — SODIUM CHLORIDE 0.9 % IV SOLN
2400.0000 mg/m2 | INTRAVENOUS | Status: DC
  Administered 2016-01-24: 5250 mg via INTRAVENOUS
  Filled 2016-01-24: qty 105

## 2016-01-24 MED ORDER — OXALIPLATIN CHEMO INJECTION 100 MG/20ML
85.0000 mg/m2 | Freq: Once | INTRAVENOUS | Status: AC
  Administered 2016-01-24: 185 mg via INTRAVENOUS
  Filled 2016-01-24: qty 37

## 2016-01-24 MED ORDER — DEXTROSE 5 % IV SOLN
Freq: Once | INTRAVENOUS | Status: AC
  Administered 2016-01-24: 11:00:00 via INTRAVENOUS

## 2016-01-24 MED ORDER — PALONOSETRON HCL INJECTION 0.25 MG/5ML
0.2500 mg | Freq: Once | INTRAVENOUS | Status: AC
  Administered 2016-01-24: 0.25 mg via INTRAVENOUS

## 2016-01-24 MED ORDER — PALONOSETRON HCL INJECTION 0.25 MG/5ML
INTRAVENOUS | Status: AC
  Filled 2016-01-24: qty 5

## 2016-01-24 MED ORDER — LEUCOVORIN CALCIUM INJECTION 350 MG
400.0000 mg/m2 | Freq: Once | INTRAMUSCULAR | Status: AC
  Administered 2016-01-24: 876 mg via INTRAVENOUS
  Filled 2016-01-24: qty 43.8

## 2016-01-24 MED ORDER — FLUOROURACIL CHEMO INJECTION 2.5 GM/50ML
400.0000 mg/m2 | Freq: Once | INTRAVENOUS | Status: AC
  Administered 2016-01-24: 900 mg via INTRAVENOUS
  Filled 2016-01-24: qty 18

## 2016-01-24 MED ORDER — HEPARIN SOD (PORK) LOCK FLUSH 100 UNIT/ML IV SOLN
500.0000 [IU] | Freq: Once | INTRAVENOUS | Status: DC | PRN
  Filled 2016-01-24: qty 5

## 2016-01-24 NOTE — Progress Notes (Signed)
Nutrition Assessment   Reason for Assessment:   Patient of Dr. Benay Spice, concern for weight loss and poor appetite  ASSESSMENT:  76 year old male with pancreatic cancer.  Patient seen in infusion this am. Planning neoadjuvant chemotherapy prior to surgery. Past medical history of HTN, right hip revision, prostate cancer  Patient reports for the past 2-3 weeks appetite is much improved and has a desire to eat. Prior to that decreased appetite and weight loss per pt. Patient eating soup during visit this am and reports has already eaten [place] sandwich earlier.  Patient reports that typically for breakfast may have pancakes with bacon or sausage or toast and jelly along with juice, for lunch will eat sandwich, and for dinner will eat soup and crackers, fish filets. Reports that he has not tried ensure/boost supplement drinks.  Medications: Vit D, folic acid, compazine  Labs: reviewed  Anthropometrics:   Height: 75 inches  Weight: 196 pounds, increased from 192 pounds on 1-9 UBW: 210-215 pounds this summer BMI: 24  7% weight loss in the last 5 months, not significant  NUTRITION DIAGNOSIS: Unintentional weight loss prior to 2-3 weeks ago related to cancer and cancer related treatments as evidenced by weight loss of 4 pounds from 12/26 to 1/9.  This is currently resolving with weight gain and increased intake   MALNUTRITION DIAGNOSIS: none at this time   INTERVENTION:   Discussed strategies to increase calories and protein to continue to gain weight.  Handout provided and teach back used.  Discussed oral nutrition supplements to provide additional calories and protein.      MONITORING, EVALUATION, GOAL: Patient will continue to consume adequate calories and protein to prevent further weight loss   NEXT VISIT:  As needed  Etana Beets B. Zenia Resides, [place], [place] (pager)

## 2016-01-24 NOTE — Patient Instructions (Signed)
[facility] Discharge Instructions for Patients Receiving Chemotherapy  Today you received the following chemotherapy agents: Oxaliplatin, Leucovorin and Adrucil.   To help prevent nausea and vomiting after your treatment, we encourage you to take your nausea medication as directed.  If you develop nausea and vomiting that is not controlled by your nausea medication, call the clinic.   BELOW ARE SYMPTOMS THAT SHOULD BE REPORTED IMMEDIATELY:  *FEVER GREATER THAN 100.5 F  *CHILLS WITH OR WITHOUT FEVER  NAUSEA AND VOMITING THAT IS NOT CONTROLLED WITH YOUR NAUSEA MEDICATION  *UNUSUAL SHORTNESS OF BREATH  *UNUSUAL BRUISING OR BLEEDING  TENDERNESS IN MOUTH AND THROAT WITH OR WITHOUT PRESENCE OF ULCERS  *URINARY PROBLEMS  *BOWEL PROBLEMS  UNUSUAL RASH Items with * indicate a potential emergency and should be followed up as soon as possible.  Feel free to call the clinic you have any questions or concerns. The clinic phone number is [phone].  Please show the CHEMO ALERT CARD at check-in to the Emergency Department and triage nurse.   

## 2016-01-24 NOTE — Progress Notes (Signed)
Per paper copy CMET from laboratory technician, all labs are WNL.   Blood return noted before Adrucil push and before Adrucil pump connection.

## 2016-01-24 NOTE — Progress Notes (Signed)
  [place] OFFICE PROGRESS NOTE   Diagnosis: Pancreas cancer  INTERVAL HISTORY:   Dustin Lucas returns as scheduled. He was last treated with chemotherapy 12/27/2015. Cycle 3 FOLFOX was held secondary to neutropenia. He reports an improved appetite. No pain. No neuropathy symptoms. He continues to have intermittent dizziness.  Objective:  Vital signs in last 24 hours:  Blood pressure 135/79, pulse 92, temperature 97.6 F (36.4 C), temperature source Oral, resp. rate 18, height 6\' 3"  (1.905 m), weight 196 lb 1.6 oz (89 kg), SpO2 100 %.    HEENT: Mild thrush over the tongue and left buccal mucosa. No ulcers Resp: Lungs clear bilaterally Cardio: Regular rate and rhythm GI: No hepatomegaly, no mass, nontender Vascular: No leg edema  Skin: Mild hyperpigmentation of the hands   Portacath/PICC-without erythema  Lab Results:  Lab Results  Component Value Date   WBC 5.6 01/24/2016   HGB 13.3 01/24/2016   HCT 39.4 01/24/2016   MCV 96.4 01/24/2016   PLT 179 01/24/2016   NEUTROABS 3.4 01/24/2016    Medications: I have reviewed the patient's current medications.  Assessment/Plan: 1. Pancreas cancer-presenting with obstructive jaundice ? CT 08/18/2015 with intra-and extrahepatic Dillard dilatation and dilatation of the pancreas duct, vague fullness at the head of the pancreas ? EUS 10/05/2015 confirmed a pancreas head mass with no evidence of vascular invasion and no peripancreatic adenopathy (uT2,uN0) sees ? EUS biopsy of the pancreas head mass on 10/05/2015 confirmed adenocarcinoma ? CT chest[facility]domen at [facility] 11/16/2015-hypoenhancing mass in the head and uncinate process of the pancreas with increased markedpancreatic ductal dilatation and greater than 180 encasement of the superior mesenteric artery and superior mesenteric vein above the level of the first tributary. ? Cycle 1 FOLFOX 12/12/2015 ? Cycle 2 FOLFOX 12/27/2015 ? Cycle 3 FOLFOX 01/24/2016-Neulasta  added  2. Obstructive jaundice secondary to #1-status postplacement of a bile duct stent  3. Hypertension  4. Anorexia/weight loss secondary to #1  5.     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 The plan is to proceed with cycle 3 FOLFOX today. He will receive Neulasta support with this cycle. We will follow-up on the CA 19-9 from today. He will return for an office visit and chemotherapy in 2 weeks.   Betsy Coder, MD  01/24/2016  10:05 AM

## 2016-01-25 LAB — CANCER ANTIGEN 19-9

## 2016-01-26 ENCOUNTER — Ambulatory Visit (HOSPITAL_BASED_OUTPATIENT_CLINIC_OR_DEPARTMENT_OTHER): Payer: Medicare Other

## 2016-01-26 ENCOUNTER — Ambulatory Visit: Payer: Medicare Other

## 2016-01-26 VITALS — BP 129/76 | HR 80 | Temp 98.9°F | Resp 18

## 2016-01-26 DIAGNOSIS — D701 Agranulocytosis secondary to cancer chemotherapy: Secondary | ICD-10-CM

## 2016-01-26 DIAGNOSIS — Z452 Encounter for adjustment and management of vascular access device: Secondary | ICD-10-CM

## 2016-01-26 DIAGNOSIS — C250 Malignant neoplasm of head of pancreas: Secondary | ICD-10-CM | POA: Diagnosis not present

## 2016-01-26 MED ORDER — SODIUM CHLORIDE 0.9% FLUSH
10.0000 mL | INTRAVENOUS | Status: DC | PRN
  Administered 2016-01-26: 10 mL
  Filled 2016-01-26: qty 10

## 2016-01-26 MED ORDER — PEGFILGRASTIM INJECTION 6 MG/0.6ML ~~LOC~~
6.0000 mg | PREFILLED_SYRINGE | Freq: Once | SUBCUTANEOUS | Status: AC
  Administered 2016-01-26: 6 mg via SUBCUTANEOUS
  Filled 2016-01-26: qty 0.6

## 2016-01-26 MED ORDER — HEPARIN SOD (PORK) LOCK FLUSH 100 UNIT/ML IV SOLN
500.0000 [IU] | Freq: Once | INTRAVENOUS | Status: AC | PRN
  Administered 2016-01-26: 500 [IU]
  Filled 2016-01-26: qty 5

## 2016-02-03 ENCOUNTER — Telehealth: Payer: Self-pay | Admitting: Oncology

## 2016-02-03 NOTE — Telephone Encounter (Signed)
Made 4 attempt to call Mr Dustin Lucas and got a busy signal each time but I was calling to confirm 2/6 and 2/8 appointment

## 2016-02-05 ENCOUNTER — Other Ambulatory Visit: Payer: Self-pay | Admitting: Oncology

## 2016-02-07 ENCOUNTER — Ambulatory Visit (HOSPITAL_BASED_OUTPATIENT_CLINIC_OR_DEPARTMENT_OTHER): Payer: Medicare Other | Admitting: Oncology

## 2016-02-07 ENCOUNTER — Ambulatory Visit: Payer: Medicare Other

## 2016-02-07 ENCOUNTER — Other Ambulatory Visit (HOSPITAL_BASED_OUTPATIENT_CLINIC_OR_DEPARTMENT_OTHER): Payer: Medicare Other

## 2016-02-07 VITALS — BP 140/69 | HR 79 | Temp 98.7°F | Resp 18 | Ht 75.0 in | Wt 187.9 lb

## 2016-02-07 DIAGNOSIS — Z452 Encounter for adjustment and management of vascular access device: Secondary | ICD-10-CM | POA: Diagnosis not present

## 2016-02-07 DIAGNOSIS — C250 Malignant neoplasm of head of pancreas: Secondary | ICD-10-CM

## 2016-02-07 DIAGNOSIS — I10 Essential (primary) hypertension: Secondary | ICD-10-CM

## 2016-02-07 DIAGNOSIS — Z95828 Presence of other vascular implants and grafts: Secondary | ICD-10-CM

## 2016-02-07 LAB — CBC WITH DIFFERENTIAL/PLATELET
BASO%: 0.2 % (ref 0.0–2.0)
BASOS ABS: 0 10*3/uL (ref 0.0–0.1)
EOS ABS: 0 10*3/uL (ref 0.0–0.5)
EOS%: 0.3 % (ref 0.0–7.0)
HCT: 37.5 % — ABNORMAL LOW (ref 38.4–49.9)
HGB: 12.3 g/dL — ABNORMAL LOW (ref 13.0–17.1)
LYMPH%: 12.9 % — ABNORMAL LOW (ref 14.0–49.0)
MCH: 31.5 pg (ref 27.2–33.4)
MCHC: 32.8 g/dL (ref 32.0–36.0)
MCV: 96.2 fL (ref 79.3–98.0)
MONO#: 1.1 10*3/uL — AB (ref 0.1–0.9)
MONO%: 10.8 % (ref 0.0–14.0)
NEUT%: 75.8 % — AB (ref 39.0–75.0)
NEUTROS ABS: 7.4 10*3/uL — AB (ref 1.5–6.5)
PLATELETS: 124 10*3/uL — AB (ref 140–400)
RBC: 3.9 10*6/uL — ABNORMAL LOW (ref 4.20–5.82)
RDW: 16.1 % — ABNORMAL HIGH (ref 11.0–14.6)
WBC: 9.7 10*3/uL (ref 4.0–10.3)
lymph#: 1.3 10*3/uL (ref 0.9–3.3)

## 2016-02-07 LAB — COMPREHENSIVE METABOLIC PANEL
ALT: 9 U/L (ref 0–55)
ANION GAP: 10 meq/L (ref 3–11)
AST: 16 U/L (ref 5–34)
Albumin: 2.7 g/dL — ABNORMAL LOW (ref 3.5–5.0)
Alkaline Phosphatase: 117 U/L (ref 40–150)
BILIRUBIN TOTAL: 0.94 mg/dL (ref 0.20–1.20)
BUN: 9.2 mg/dL (ref 7.0–26.0)
CO2: 25 meq/L (ref 22–29)
Calcium: 9.2 mg/dL (ref 8.4–10.4)
Chloride: 106 mEq/L (ref 98–109)
Creatinine: 0.8 mg/dL (ref 0.7–1.3)
Glucose: 110 mg/dl (ref 70–140)
POTASSIUM: 3.1 meq/L — AB (ref 3.5–5.1)
Sodium: 141 mEq/L (ref 136–145)
TOTAL PROTEIN: 6.5 g/dL (ref 6.4–8.3)

## 2016-02-07 MED ORDER — SODIUM CHLORIDE 0.9% FLUSH
10.0000 mL | INTRAVENOUS | Status: DC | PRN
  Administered 2016-02-07: 10 mL via INTRAVENOUS
  Filled 2016-02-07: qty 10

## 2016-02-07 MED ORDER — HEPARIN SOD (PORK) LOCK FLUSH 100 UNIT/ML IV SOLN
500.0000 [IU] | Freq: Once | INTRAVENOUS | Status: AC
  Administered 2016-02-07: 500 [IU] via INTRAVENOUS
  Filled 2016-02-07: qty 5

## 2016-02-07 NOTE — Progress Notes (Signed)
  [place] OFFICE PROGRESS NOTE   Diagnosis: Pancreas cancer  INTERVAL HISTORY:   Dustin Lucas returns as scheduled. He completed cycle 3 FOLFOX beginning 01/24/2016. He received Neulasta on 01/26/2016. He denies nausea/vomiting, mouth sores, diarrhea, and neuropathy symptoms. He complains of profound malaise following this cycle of chemotherapy. He continues to have anorexia. No pain. The malaise improved faster following previous cycles of chemotherapy. He becomes fatigued when performing activities and is home. No dyspnea.    Objective:  Vital signs in last 24 hours:  Blood pressure 140/69, pulse 79, temperature 98.7 F (37.1 C), temperature source Oral, resp. rate 18, height 6\' 3"  (1.905 m), weight 187 lb 14.4 oz (85.2 kg), SpO2 96 %.    HEENT: No thrush or ulcers Resp: Lungs clear bilaterally Cardio: Regular rate and rhythm GI: No hepatomegaly, no mass, nontender Vascular: No leg edema Neuro: Moderate decrease in vibratory sense at the fingertips bilaterally     Portacath/PICC-without erythema  Lab Results:  Lab Results  Component Value Date   WBC 9.7 02/07/2016   HGB 12.3 (L) 02/07/2016   HCT 37.5 (L) 02/07/2016   MCV 96.2 02/07/2016   PLT 124 (L) 02/07/2016   NEUTROABS 7.4 (H) 02/07/2016   CA 19-9 on 01/24/2016-1086   Medications: I have reviewed the patient's current medications.  1. Pancreas cancer-presenting with obstructive jaundice ? CT 08/18/2015 with intra-and extrahepatic Dillard dilatation and dilatation of the pancreas duct, vague fullness at the head of the pancreas ? EUS 10/05/2015 confirmed a pancreas head mass with no evidence of vascular invasion and no peripancreatic adenopathy (uT2,uN0) sees ? EUS biopsy of the pancreas head mass on 10/05/2015 confirmed adenocarcinoma ? CT chest[facility]domen at [facility] 11/16/2015-hypoenhancing mass in the head and uncinate process of the pancreas with increased markedpancreatic ductal dilatation and  greater than 180 encasement of the superior mesenteric artery and superior mesenteric vein above the level of the first tributary. ? Cycle 1 FOLFOX 12/12/2015 ? Cycle 2 FOLFOX 12/27/2015 ? Cycle 3 FOLFOX 01/24/2016-Neulasta added  2. Obstructive jaundice secondary to #1-status postplacement of a bile duct stent  3. Hypertension  4. Anorexia/weight loss secondary to #1  5.  history of Neutropenia secondary to chemotherapy, Neulasta added with cycle 3 FOLFOX  6.    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 The most recent cycle of chemotherapy was complicated by malaise that has persisted. We discussed the indication for proceeding with chemotherapy today. He has lost over the past 2 weeks. I do not recommend proceeding with chemotherapy today. He is in agreement. He will return for an office visit with the plan to complete cycle 4 FOLFOX in one week. He will be scheduled for a restaging evaluation after cycle 4.  I encouraged him to increase his diet and activity as tolerated.  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  Betsy Coder, MD  02/07/2016  2:06 PM

## 2016-02-07 NOTE — Patient Instructions (Signed)

## 2016-02-08 LAB — CANCER ANTIGEN 19-9: CAN 19-9: 466 U/mL — AB (ref 0–35)

## 2016-02-11 ENCOUNTER — Telehealth: Payer: Self-pay | Admitting: Oncology

## 2016-02-11 ENCOUNTER — Telehealth: Payer: Self-pay | Admitting: Nurse Practitioner

## 2016-02-11 NOTE — Telephone Encounter (Signed)
Appointments scheduled per 2/6 LOS. Patient notified. °

## 2016-02-11 NOTE — Telephone Encounter (Signed)
Called patient to inform him of his next scheduled appointments and mailed out new schedule.

## 2016-02-14 ENCOUNTER — Ambulatory Visit (HOSPITAL_BASED_OUTPATIENT_CLINIC_OR_DEPARTMENT_OTHER): Payer: Medicare Other

## 2016-02-14 ENCOUNTER — Telehealth: Payer: Self-pay

## 2016-02-14 ENCOUNTER — Ambulatory Visit (HOSPITAL_BASED_OUTPATIENT_CLINIC_OR_DEPARTMENT_OTHER): Payer: Medicare Other | Admitting: Nurse Practitioner

## 2016-02-14 ENCOUNTER — Telehealth: Payer: Self-pay | Admitting: Oncology

## 2016-02-14 ENCOUNTER — Other Ambulatory Visit (HOSPITAL_BASED_OUTPATIENT_CLINIC_OR_DEPARTMENT_OTHER): Payer: Medicare Other

## 2016-02-14 VITALS — BP 146/79 | HR 87 | Temp 98.8°F | Resp 18 | Ht 75.0 in | Wt 187.8 lb

## 2016-02-14 DIAGNOSIS — Z95828 Presence of other vascular implants and grafts: Secondary | ICD-10-CM

## 2016-02-14 DIAGNOSIS — I10 Essential (primary) hypertension: Secondary | ICD-10-CM

## 2016-02-14 DIAGNOSIS — C250 Malignant neoplasm of head of pancreas: Secondary | ICD-10-CM

## 2016-02-14 DIAGNOSIS — Z5111 Encounter for antineoplastic chemotherapy: Secondary | ICD-10-CM

## 2016-02-14 DIAGNOSIS — D701 Agranulocytosis secondary to cancer chemotherapy: Secondary | ICD-10-CM

## 2016-02-14 DIAGNOSIS — Z452 Encounter for adjustment and management of vascular access device: Secondary | ICD-10-CM | POA: Diagnosis not present

## 2016-02-14 LAB — CBC WITH DIFFERENTIAL/PLATELET
BASO%: 0.2 % (ref 0.0–2.0)
BASOS ABS: 0 10*3/uL (ref 0.0–0.1)
EOS%: 0.2 % (ref 0.0–7.0)
Eosinophils Absolute: 0 10*3/uL (ref 0.0–0.5)
HEMATOCRIT: 35.4 % — AB (ref 38.4–49.9)
HEMOGLOBIN: 11.8 g/dL — AB (ref 13.0–17.1)
LYMPH#: 1.2 10*3/uL (ref 0.9–3.3)
LYMPH%: 14.1 % (ref 14.0–49.0)
MCH: 31.6 pg (ref 27.2–33.4)
MCHC: 33.3 g/dL (ref 32.0–36.0)
MCV: 94.9 fL (ref 79.3–98.0)
MONO#: 1.1 10*3/uL — ABNORMAL HIGH (ref 0.1–0.9)
MONO%: 12.2 % (ref 0.0–14.0)
NEUT#: 6.4 10*3/uL (ref 1.5–6.5)
NEUT%: 73.3 % (ref 39.0–75.0)
Platelets: 200 10*3/uL (ref 140–400)
RBC: 3.73 10*6/uL — ABNORMAL LOW (ref 4.20–5.82)
RDW: 16.6 % — AB (ref 11.0–14.6)
WBC: 8.7 10*3/uL (ref 4.0–10.3)

## 2016-02-14 LAB — COMPREHENSIVE METABOLIC PANEL
ALT: 17 U/L (ref 0–55)
AST: 26 U/L (ref 5–34)
Albumin: 2.5 g/dL — ABNORMAL LOW (ref 3.5–5.0)
Alkaline Phosphatase: 107 U/L (ref 40–150)
Anion Gap: 11 mEq/L (ref 3–11)
BUN: 10.5 mg/dL (ref 7.0–26.0)
CHLORIDE: 105 meq/L (ref 98–109)
CO2: 26 mEq/L (ref 22–29)
CREATININE: 0.8 mg/dL (ref 0.7–1.3)
Calcium: 9.3 mg/dL (ref 8.4–10.4)
EGFR: >90 mL/min/{1.73_m2} (ref >90)
GLUCOSE: 107 mg/dL (ref 70–140)
POTASSIUM: 3.7 meq/L (ref 3.5–5.1)
SODIUM: 142 meq/L (ref 136–145)
Total Bilirubin: 0.69 mg/dL (ref 0.20–1.20)
Total Protein: 6.5 g/dL (ref 6.4–8.3)

## 2016-02-14 MED ORDER — SODIUM CHLORIDE 0.9 % IJ SOLN
10.0000 mL | Freq: Once | INTRAMUSCULAR | Status: AC
  Administered 2016-02-14: 10 mL via INTRAVENOUS
  Filled 2016-02-14: qty 10

## 2016-02-14 MED ORDER — SODIUM CHLORIDE 0.9 % IV SOLN
2400.0000 mg/m2 | INTRAVENOUS | Status: DC
  Administered 2016-02-14: 5250 mg via INTRAVENOUS
  Filled 2016-02-14: qty 105

## 2016-02-14 MED ORDER — SODIUM CHLORIDE 0.9% FLUSH
10.0000 mL | INTRAVENOUS | Status: DC | PRN
  Administered 2016-02-14: 10 mL via INTRAVENOUS
  Filled 2016-02-14: qty 10

## 2016-02-14 MED ORDER — PALONOSETRON HCL INJECTION 0.25 MG/5ML
INTRAVENOUS | Status: AC
  Filled 2016-02-14: qty 5

## 2016-02-14 MED ORDER — DEXTROSE 5 % IV SOLN
Freq: Once | INTRAVENOUS | Status: AC
  Administered 2016-02-14: 13:00:00 via INTRAVENOUS

## 2016-02-14 MED ORDER — FLUOROURACIL CHEMO INJECTION 2.5 GM/50ML
400.0000 mg/m2 | Freq: Once | INTRAVENOUS | Status: AC
  Administered 2016-02-14: 900 mg via INTRAVENOUS
  Filled 2016-02-14: qty 18

## 2016-02-14 MED ORDER — OXALIPLATIN CHEMO INJECTION 100 MG/20ML
85.0000 mg/m2 | Freq: Once | INTRAVENOUS | Status: AC
  Administered 2016-02-14: 185 mg via INTRAVENOUS
  Filled 2016-02-14: qty 37

## 2016-02-14 MED ORDER — PALONOSETRON HCL INJECTION 0.25 MG/5ML
0.2500 mg | Freq: Once | INTRAVENOUS | Status: AC
  Administered 2016-02-14: 0.25 mg via INTRAVENOUS

## 2016-02-14 MED ORDER — LEUCOVORIN CALCIUM INJECTION 350 MG
400.0000 mg/m2 | Freq: Once | INTRAVENOUS | Status: AC
  Administered 2016-02-14: 876 mg via INTRAVENOUS
  Filled 2016-02-14: qty 43.8

## 2016-02-14 MED ORDER — SODIUM CHLORIDE 0.9 % IV SOLN
Freq: Once | INTRAVENOUS | Status: AC
  Administered 2016-02-14: 12:00:00 via INTRAVENOUS
  Filled 2016-02-14: qty 5

## 2016-02-14 NOTE — Telephone Encounter (Signed)
Appointments scheduled per 02/14/16 los. Patient will pick up schedule on 02/16/16.

## 2016-02-14 NOTE — Patient Instructions (Signed)
[facility] Discharge Instructions for Patients Receiving Chemotherapy  Today you received the following chemotherapy agents: Oxaliplatin, Leucovorin, Adrucil  To help prevent nausea and vomiting after your treatment, we encourage you to take your nausea medication as prescribed.   If you develop nausea and vomiting that is not controlled by your nausea medication, call the clinic.   BELOW ARE SYMPTOMS THAT SHOULD BE REPORTED IMMEDIATELY:  *FEVER GREATER THAN 100.5 F  *CHILLS WITH OR WITHOUT FEVER  NAUSEA AND VOMITING THAT IS NOT CONTROLLED WITH YOUR NAUSEA MEDICATION  *UNUSUAL SHORTNESS OF BREATH  *UNUSUAL BRUISING OR BLEEDING  TENDERNESS IN MOUTH AND THROAT WITH OR WITHOUT PRESENCE OF ULCERS  *URINARY PROBLEMS  *BOWEL PROBLEMS  UNUSUAL RASH Items with * indicate a potential emergency and should be followed up as soon as possible.  Feel free to call the clinic you have any questions or concerns. The clinic phone number is [phone].  Please show the CHEMO ALERT CARD at check-in to the Emergency Department and triage nurse.   

## 2016-02-14 NOTE — Progress Notes (Signed)
  [place] OFFICE PROGRESS NOTE   Diagnosis:  Pancreas cancer  INTERVAL HISTORY:   Mr. Dustin Lucas returns as scheduled. He has completed 3 cycles of FOLFOX. Cycle 4 was held last week due to weight loss, malaise. He overall is feeling better. Appetite varies. He has noted some improvement in his energy level. He reports his weight is stable. He denies nausea/vomiting. No mouth sores. No diarrhea. No numbness or tingling in his hands or feet.  Objective:  Vital signs in last 24 hours:  Blood pressure (!) 146/79, pulse 87, temperature 98.8 F (37.1 C), temperature source Oral, resp. rate 18, height 6\' 3"  (1.905 m), weight 187 lb 12.8 oz (85.2 kg), SpO2 100 %.    HEENT: No thrush or ulcers. Resp: Lungs clear bilaterally. Cardio: Regular rate and rhythm. GI: Abdomen soft and nontender. No hepatomegaly. Vascular: No leg edema. Neuro: Vibratory sense moderately decreased over the fingertips per tuning fork exam. Port-A-Cath without erythema.    Lab Results:  Lab Results  Component Value Date   WBC 8.7 02/14/2016   HGB 11.8 (L) 02/14/2016   HCT 35.4 (L) 02/14/2016   MCV 94.9 02/14/2016   PLT 200 02/14/2016   NEUTROABS 6.4 02/14/2016    Imaging:  No results found.  Medications: I have reviewed the patient's current medications.  Assessment/Plan: 1. Pancreas cancer-presenting with obstructive jaundice ? CT 08/18/2015 with intra-and extrahepatic Dillard dilatation and dilatation of the pancreas duct, vague fullness at the head of the pancreas ? EUS 10/05/2015 confirmed a pancreas head mass with no evidence of vascular invasion and no peripancreatic adenopathy (uT2,uN0) sees ? EUS biopsy of the pancreas head mass on 10/05/2015 confirmed adenocarcinoma ? CT [facility]en [facility] 11/16/2015-hypoenhancing mass in the head and uncinate process of the pancreas with increased markedpancreatic ductal dilatation and greater than 180 encasement of the superior mesenteric  artery and superior mesenteric vein above the level of the first tributary. ? Cycle 1 FOLFOX 12/12/2015 ? Cycle 2 FOLFOX 12/27/2015 ? Cycle 3 FOLFOX 01/24/2016-Neulasta added ? Cycle 4 FOLFOX 02/14/2016  2. Obstructive jaundice secondary to #1-status postplacement of a bile duct stent  3. Hypertension  4. Anorexia/weight loss secondary to #1  5. 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 Appetite and energy level are some improved. The plan is to proceed with cycle 4 FOLFOX today as scheduled. We will contact medical oncology and surgery at [facility] for follow-up appointments. He will return for a follow-up visit here in 3 weeks.  Plan reviewed with Dr. Benay Spice.    Ned Card ANP/GNP-BC   02/14/2016  10:47 AM

## 2016-02-14 NOTE — Telephone Encounter (Signed)
Called Dr. Cherlynn Perches office to schedule follow up for restaging after 4th cycle of folfox (will be completed today) Spoke with Levada Dy who scheduled pt for feb 28th at 930 am. Pt aware.

## 2016-02-16 ENCOUNTER — Ambulatory Visit: Payer: Medicare Other

## 2016-02-16 ENCOUNTER — Ambulatory Visit (HOSPITAL_BASED_OUTPATIENT_CLINIC_OR_DEPARTMENT_OTHER): Payer: Medicare Other

## 2016-02-16 VITALS — BP 111/62 | HR 90 | Temp 98.2°F | Resp 18

## 2016-02-16 DIAGNOSIS — Z452 Encounter for adjustment and management of vascular access device: Secondary | ICD-10-CM

## 2016-02-16 DIAGNOSIS — D701 Agranulocytosis secondary to cancer chemotherapy: Secondary | ICD-10-CM

## 2016-02-16 DIAGNOSIS — C250 Malignant neoplasm of head of pancreas: Secondary | ICD-10-CM | POA: Diagnosis not present

## 2016-02-16 MED ORDER — HEPARIN SOD (PORK) LOCK FLUSH 100 UNIT/ML IV SOLN
500.0000 [IU] | Freq: Once | INTRAVENOUS | Status: AC | PRN
  Administered 2016-02-16: 500 [IU]
  Filled 2016-02-16: qty 5

## 2016-02-16 MED ORDER — PEGFILGRASTIM INJECTION 6 MG/0.6ML ~~LOC~~
6.0000 mg | PREFILLED_SYRINGE | Freq: Once | SUBCUTANEOUS | Status: AC
  Administered 2016-02-16: 6 mg via SUBCUTANEOUS
  Filled 2016-02-16: qty 0.6

## 2016-02-16 MED ORDER — SODIUM CHLORIDE 0.9% FLUSH
10.0000 mL | INTRAVENOUS | Status: DC | PRN
  Administered 2016-02-16: 10 mL
  Filled 2016-02-16: qty 10

## 2016-02-28 ENCOUNTER — Telehealth: Payer: Self-pay | Admitting: *Deleted

## 2016-02-28 DIAGNOSIS — C250 Malignant neoplasm of head of pancreas: Secondary | ICD-10-CM

## 2016-02-28 DIAGNOSIS — R350 Frequency of micturition: Secondary | ICD-10-CM

## 2016-02-28 NOTE — Telephone Encounter (Signed)
Discussed pt's call with Dr. Benay Spice: Bring pt in for labs. Returned call to pt, appt given for lab 2/28 @ 11. He has not checked temperature today. Instructed him to wait in lobby for result.

## 2016-02-28 NOTE — Telephone Encounter (Signed)
"  I need to talk with Dr. Benay Spice about frequent urination started a few days ago.  Also extreme fatigue when I try to do anything.  The fatigue started before the urination.  I drink boost because I do not eat a lot.  I do drink water and juice.  Since t his morning at 0700, I've urinated fourteen times.  No fever.  Urine is dark.  Return number [phone]."

## 2016-02-29 ENCOUNTER — Ambulatory Visit: Payer: Medicare Other

## 2016-03-01 NOTE — Telephone Encounter (Signed)
Pt called to reschedule missed lab appt. Reports he forgot. His daughter is off tomorrow and can bring him then. Appt given for 0900. He denies fever. Reports urinary frequency and occasional mild dysuria.

## 2016-03-02 ENCOUNTER — Other Ambulatory Visit: Payer: Medicare Other

## 2016-03-06 ENCOUNTER — Telehealth: Payer: Self-pay | Admitting: *Deleted

## 2016-03-06 ENCOUNTER — Other Ambulatory Visit: Payer: Medicare Other

## 2016-03-06 ENCOUNTER — Ambulatory Visit: Payer: Medicare Other | Admitting: Oncology

## 2016-03-06 ENCOUNTER — Telehealth: Payer: Self-pay | Admitting: Oncology

## 2016-03-06 NOTE — Telephone Encounter (Signed)
Missed appt. Called pt, he forgot about appointment. He reports he feels better today, is eating and drinking. Requests to reschedule to next week.  Message to schedulers for appt.

## 2016-03-06 NOTE — Telephone Encounter (Signed)
lvm to inform pt of 3/13 appt at 10am per LOS

## 2016-03-13 ENCOUNTER — Other Ambulatory Visit (HOSPITAL_BASED_OUTPATIENT_CLINIC_OR_DEPARTMENT_OTHER): Payer: Medicare Other

## 2016-03-13 ENCOUNTER — Telehealth: Payer: Self-pay | Admitting: Nurse Practitioner

## 2016-03-13 ENCOUNTER — Ambulatory Visit (HOSPITAL_BASED_OUTPATIENT_CLINIC_OR_DEPARTMENT_OTHER): Payer: Medicare Other

## 2016-03-13 ENCOUNTER — Ambulatory Visit (HOSPITAL_BASED_OUTPATIENT_CLINIC_OR_DEPARTMENT_OTHER): Payer: Medicare Other | Admitting: Nurse Practitioner

## 2016-03-13 VITALS — BP 100/68 | HR 96 | Temp 98.8°F | Resp 17 | Ht 75.0 in | Wt 178.0 lb

## 2016-03-13 DIAGNOSIS — Z5111 Encounter for antineoplastic chemotherapy: Secondary | ICD-10-CM

## 2016-03-13 DIAGNOSIS — R300 Dysuria: Secondary | ICD-10-CM

## 2016-03-13 DIAGNOSIS — D701 Agranulocytosis secondary to cancer chemotherapy: Secondary | ICD-10-CM | POA: Diagnosis not present

## 2016-03-13 DIAGNOSIS — C250 Malignant neoplasm of head of pancreas: Secondary | ICD-10-CM

## 2016-03-13 DIAGNOSIS — I10 Essential (primary) hypertension: Secondary | ICD-10-CM

## 2016-03-13 DIAGNOSIS — Z95828 Presence of other vascular implants and grafts: Secondary | ICD-10-CM

## 2016-03-13 DIAGNOSIS — Z452 Encounter for adjustment and management of vascular access device: Secondary | ICD-10-CM

## 2016-03-13 LAB — URINALYSIS, MICROSCOPIC - CHCC
Bilirubin (Urine): NEGATIVE
Glucose: NEGATIVE mg/dL
KETONES: NEGATIVE mg/dL
Nitrite: NEGATIVE
PH: 6 (ref 4.6–8.0)
PROTEIN: 30 mg/dL
SPECIFIC GRAVITY, URINE: 1.015 (ref 1.003–1.035)
Urobilinogen, UR: 0.2 mg/dL (ref 0.2–1)

## 2016-03-13 LAB — COMPREHENSIVE METABOLIC PANEL
ALBUMIN: 2.7 g/dL — AB (ref 3.5–5.0)
ALK PHOS: 104 U/L (ref 40–150)
ALT: 8 U/L (ref 0–55)
AST: 21 U/L (ref 5–34)
Anion Gap: 9 mEq/L (ref 3–11)
BILIRUBIN TOTAL: 0.73 mg/dL (ref 0.20–1.20)
BUN: 8.2 mg/dL (ref 7.0–26.0)
CALCIUM: 9.3 mg/dL (ref 8.4–10.4)
CO2: 25 mEq/L (ref 22–29)
CREATININE: 0.9 mg/dL (ref 0.7–1.3)
Chloride: 108 mEq/L (ref 98–109)
EGFR: >90 mL/min/{1.73_m2} (ref >90)
Glucose: 96 mg/dl (ref 70–140)
Potassium: 3.7 mEq/L (ref 3.5–5.1)
Sodium: 142 mEq/L (ref 136–145)
TOTAL PROTEIN: 6.7 g/dL (ref 6.4–8.3)

## 2016-03-13 LAB — CBC WITH DIFFERENTIAL/PLATELET
BASO%: 0.6 % (ref 0.0–2.0)
Basophils Absolute: 0 10*3/uL (ref 0.0–0.1)
EOS%: 0.5 % (ref 0.0–7.0)
Eosinophils Absolute: 0 10*3/uL (ref 0.0–0.5)
HEMATOCRIT: 34.4 % — AB (ref 38.4–49.9)
HEMOGLOBIN: 11.4 g/dL — AB (ref 13.0–17.1)
LYMPH#: 2 10*3/uL (ref 0.9–3.3)
LYMPH%: 35.5 % (ref 14.0–49.0)
MCH: 32 pg (ref 27.2–33.4)
MCHC: 33.1 g/dL (ref 32.0–36.0)
MCV: 96.6 fL (ref 79.3–98.0)
MONO#: 0.9 10*3/uL (ref 0.1–0.9)
MONO%: 15.6 % — ABNORMAL HIGH (ref 0.0–14.0)
NEUT#: 2.7 10*3/uL (ref 1.5–6.5)
NEUT%: 47.8 % (ref 39.0–75.0)
PLATELETS: 180 10*3/uL (ref 140–400)
RBC: 3.56 10*6/uL — ABNORMAL LOW (ref 4.20–5.82)
RDW: 18 % — AB (ref 11.0–14.6)
WBC: 5.7 10*3/uL (ref 4.0–10.3)

## 2016-03-13 MED ORDER — DEXTROSE 5 % IV SOLN: Freq: Once | INTRAVENOUS | Status: DC

## 2016-03-13 MED ORDER — LEUCOVORIN CALCIUM INJECTION 350 MG
400.0000 mg/m2 | Freq: Once | INTRAVENOUS | Status: AC
  Administered 2016-03-13: 876 mg via INTRAVENOUS
  Filled 2016-03-13: qty 43.8

## 2016-03-13 MED ORDER — SODIUM CHLORIDE 0.9 % IV SOLN
INTRAVENOUS | Status: DC
  Administered 2016-03-13: 14:00:00 via INTRAVENOUS

## 2016-03-13 MED ORDER — SODIUM CHLORIDE 0.9 % IV SOLN
2400.0000 mg/m2 | INTRAVENOUS | Status: DC
  Administered 2016-03-13: 5250 mg via INTRAVENOUS
  Filled 2016-03-13: qty 105

## 2016-03-13 MED ORDER — SODIUM CHLORIDE 0.9% FLUSH
10.0000 mL | INTRAVENOUS | Status: DC | PRN
  Administered 2016-03-13: 10 mL via INTRAVENOUS
  Filled 2016-03-13: qty 10

## 2016-03-13 MED ORDER — FLUOROURACIL CHEMO INJECTION 2.5 GM/50ML
400.0000 mg/m2 | Freq: Once | INTRAVENOUS | Status: AC
  Administered 2016-03-13: 900 mg via INTRAVENOUS
  Filled 2016-03-13: qty 18

## 2016-03-13 NOTE — Patient Instructions (Signed)
[facility] Discharge Instructions for Patients Receiving Chemotherapy  Today you received the following chemotherapy agents: Leucovorin, Adrucil.  To help prevent nausea and vomiting after your treatment, we encourage you to take your nausea medication as prescribed.   If you develop nausea and vomiting that is not controlled by your nausea medication, call the clinic.   BELOW ARE SYMPTOMS THAT SHOULD BE REPORTED IMMEDIATELY:  *FEVER GREATER THAN 100.5 F  *CHILLS WITH OR WITHOUT FEVER  NAUSEA AND VOMITING THAT IS NOT CONTROLLED WITH YOUR NAUSEA MEDICATION  *UNUSUAL SHORTNESS OF BREATH  *UNUSUAL BRUISING OR BLEEDING  TENDERNESS IN MOUTH AND THROAT WITH OR WITHOUT PRESENCE OF ULCERS  *URINARY PROBLEMS  *BOWEL PROBLEMS  UNUSUAL RASH Items with * indicate a potential emergency and should be followed up as soon as possible.  Feel free to call the clinic you have any questions or concerns. The clinic phone number is [phone].  Please show the [place] at check-in to the Emergency Department and triage nurse.

## 2016-03-13 NOTE — Patient Instructions (Signed)
Implanted Port Home Guide An implanted port is a type of central line that is placed under the skin. Central lines are used to provide IV access when treatment or nutrition needs to be given through a person's veins. Implanted ports are used for long-term IV access. An implanted port may be placed because:  You need IV medicine that would be irritating to the small veins in your hands or arms.  You need long-term IV medicines, such as antibiotics.  You need IV nutrition for a long period.  You need frequent blood draws for lab tests.  You need dialysis.  Implanted ports are usually placed in the chest area, but they can also be placed in the upper arm, the abdomen, or the leg. An implanted port has two main parts:  Reservoir. The reservoir is round and will appear as a small, raised area under your skin. The reservoir is the part where a needle is inserted to give medicines or draw blood.  Catheter. The catheter is a thin, flexible tube that extends from the reservoir. The catheter is placed into a large vein. Medicine that is inserted into the reservoir goes into the catheter and then into the vein.  How will I care for my incision site? Do not get the incision site wet. Bathe or shower as directed by your health care provider. How is my port accessed? Special steps must be taken to access the port:  Before the port is accessed, a numbing cream can be placed on the skin. This helps numb the skin over the port site.  Your health care provider uses a sterile technique to access the port. ? Your health care provider must put on a mask and sterile gloves. ? The skin over your port is cleaned carefully with an antiseptic and allowed to dry. ? The port is gently pinched between sterile gloves, and a needle is inserted into the port.  Only "non-coring" port needles should be used to access the port. Once the port is accessed, a blood return should be checked. This helps ensure that the port  is in the vein and is not clogged.  If your port needs to remain accessed for a constant infusion, a clear (transparent) bandage will be placed over the needle site. The bandage and needle will need to be changed every week, or as directed by your health care provider.  Keep the bandage covering the needle clean and dry. Do not get it wet. Follow your health care provider's instructions on how to take a shower or bath while the port is accessed.  If your port does not need to stay accessed, no bandage is needed over the port.  What is flushing? Flushing helps keep the port from getting clogged. Follow your health care provider's instructions on how and when to flush the port. Ports are usually flushed with saline solution or a medicine called heparin. The need for flushing will depend on how the port is used.  If the port is used for intermittent medicines or blood draws, the port will need to be flushed: ? After medicines have been given. ? After blood has been drawn. ? As part of routine maintenance.  If a constant infusion is running, the port may not need to be flushed.  How long will my port stay implanted? The port can stay in for as long as your health care provider thinks it is needed. When it is time for the port to come out, surgery will be   done to remove it. The procedure is similar to the one performed when the port was put in. When should I seek immediate medical care? When you have an implanted port, you should seek immediate medical care if:  You notice a bad smell coming from the incision site.  You have swelling, redness, or drainage at the incision site.  You have more swelling or pain at the port site or the surrounding area.  You have a fever that is not controlled with medicine.  This information is not intended to replace advice given to you by your health care provider. Make sure you discuss any questions you have with your health care provider. Document  Released: 12/18/2004 Document Revised: 05/26/2015 Document Reviewed: 08/25/2012 Elsevier Interactive Patient Education  2017 [employer]  

## 2016-03-13 NOTE — Telephone Encounter (Signed)
Appointments scheduled per 3/13 LOS. Patient given AVS report and calendars with future scheduled appointments.  °

## 2016-03-13 NOTE — Progress Notes (Signed)
Per Judson Roch, LPN, stop taking exforge. Patient verbalized understanding.   Wylene Simmer, BSN, RN 03/13/2016 2:15 PM

## 2016-03-13 NOTE — Progress Notes (Addendum)
[facility] OFFICE PROGRESS NOTE   Diagnosis:  Pancreas cancer  INTERVAL HISTORY:   Dustin Lucas returns as scheduled. He completed cycle 4 FOLFOX 02/14/2016. He has periodic weak spells. Appetite has improved over the past week. He denies nausea/vomiting. No mouth sores. On 03/02/2016 he had multiple loose stools. Bowel habits have since returned to baseline. He completed a course of Levaquin yesterday for a urinary tract infection. He continues to note frequency and burning.  Objective:  Vital signs in last 24 hours:  Blood pressure 100/68, pulse 96, temperature 98.8 F (37.1 C), temperature source Oral, resp. rate 17, height 6\' 3"  (1.905 m), weight 178 lb (80.7 kg), SpO2 99 %.    HEENT: No thrush or ulcers. Resp: Lungs clear bilaterally. Cardio: Regular rate and rhythm. GI: Abdomen is soft and nontender. No hepatomegaly. Vascular: No leg edema. Neuro: Alert and oriented.  Port-A-Cath without erythema.  Lab Results:  Lab Results  Component Value Date   WBC 5.7 03/13/2016   HGB 11.4 (L) 03/13/2016   HCT 34.4 (L) 03/13/2016   MCV 96.6 03/13/2016   PLT 180 03/13/2016   NEUTROABS 2.7 03/13/2016    Imaging:  No results found.  Medications: I have reviewed the patient's current medications.  Assessment/Plan: 1. Pancreas cancer-presenting with obstructive jaundice ? CT 08/18/2015 with intra-and extrahepatic Dillard dilatation and dilatation of the pancreas duct, vague fullness at the head of the pancreas ? EUS 10/05/2015 confirmed a pancreas head mass with no evidence of vascular invasion and no peripancreatic adenopathy (uT2,uN0) sees ? EUS biopsy of the pancreas head mass on 10/05/2015 confirmed adenocarcinoma ? CT chest/abdomen at [facility] 11/16/2015-hypoenhancing mass in the head and uncinate process of the pancreas with increased markedpancreatic ductal dilatation and greater than 180 encasement of the superior mesenteric artery and superior mesenteric  vein above the level of the first tributary. ? Cycle 1 FOLFOX 12/12/2015 ? Cycle 2 FOLFOX 12/27/2015 ? Cycle 3 FOLFOX 01/24/2016-Neulasta added ? Cycle 4 FOLFOX 02/14/2016 ? Restaging CT scans 03/02/2016-decreasing size of pancreatic head/uncinate process mass with decreased encasement of the SMV (less than 180) and decreased pancreatic ductal dilatation. There remains greater than 180 of encasement of the SMA which is patent. Unchanged position of colon bile duct stent with decreasing right-sided pneumobilia. Stable 6 mm right upper lobe nodule. ? Cycle 5 FOLFOX 03/13/2016 (oxaliplatin on hold due to poor tolerance)  2. Obstructive jaundice secondary to #1-status postplacement of a bile duct stent  3. Hypertension  4. Anorexia/weight loss secondary to #1  5. 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 Habib appears unchanged. He has completed 4 cycles of FOLFOX. Recent restaging CT scans at [facility] showed improvement in the pancreatic head/uncinate process mass. Dr.McRee's office note indicates he is responding to chemotherapy but remains unresectable. Dr. Benay Spice has spoken with Dr. Aleatha Borer. The plan is to continue FOLFOX with elimination of oxaliplatin due to poor tolerance. He will receive cycle 5 FOLFOX today as scheduled holding oxaliplatin.  He recently completed a course of Levaquin for a urinary tract infection. He continues to have frequency and dysuria. We will repeat a urinalysis and culture today.  He will return for a follow-up visit and cycle 6 in 2 weeks. He will contact the office in the interim with any problems.  Patient seen with Dr. Benay Spice. 25 minutes were spent face-to-face at today's visit with the majority of  that time involved in counseling/coordination of care.    Ned Card ANP/GNP-BC   03/13/2016  10:49 AM This was  a shared visit with Ned Card. I discussed the case with Dr. Aleatha Borer last week. The restaging CT reveals a decrease in the size of the pancreas mass, but the mass remains unresectable. He continues to have a poor performance status, but his appetite has improved over the past few weeks. The plan is to continue 5 fluorouracil chemotherapy. Hopefully his energy level will improve on this regimen. We will consider adding back dose reduced oxaliplatin if his performance status improves over the next few cycles.  We repeated a urinalysis today to follow-up on the urinary tract infection.  Julieanne Manson, M.D.

## 2016-03-14 ENCOUNTER — Telehealth: Payer: Self-pay | Admitting: *Deleted

## 2016-03-14 LAB — CANCER ANTIGEN 19-9: CA 19-9: 93 U/mL — ABNORMAL HIGH (ref 0–35)

## 2016-03-14 MED ORDER — SULFAMETHOXAZOLE-TRIMETHOPRIM 800-160 MG PO TABS: 1.0000 | ORAL_TABLET | Freq: Two times a day (BID) | ORAL | 0 refills | Status: DC

## 2016-03-14 NOTE — Telephone Encounter (Signed)
Message received from patient to obtain u/a results from 03/13/16.  Call placed back to patient and patient notified per order of Dr. Benay Spice to begin Bactrim DS one tablet twice a day for 5 days.  Patient appreciative of call back and has no questions at this time.

## 2016-03-15 ENCOUNTER — Ambulatory Visit (HOSPITAL_BASED_OUTPATIENT_CLINIC_OR_DEPARTMENT_OTHER): Payer: Medicare Other

## 2016-03-15 ENCOUNTER — Other Ambulatory Visit: Payer: Self-pay | Admitting: *Deleted

## 2016-03-15 VITALS — BP 93/50 | HR 100 | Temp 98.2°F | Resp 18

## 2016-03-15 DIAGNOSIS — Z452 Encounter for adjustment and management of vascular access device: Secondary | ICD-10-CM | POA: Diagnosis not present

## 2016-03-15 DIAGNOSIS — C250 Malignant neoplasm of head of pancreas: Secondary | ICD-10-CM

## 2016-03-15 DIAGNOSIS — R300 Dysuria: Secondary | ICD-10-CM

## 2016-03-15 LAB — CBC WITH DIFFERENTIAL/PLATELET
BASO%: 2.3 % — ABNORMAL HIGH (ref 0.0–2.0)
BASOS ABS: 0.1 10*3/uL (ref 0.0–0.1)
EOS ABS: 0 10*3/uL (ref 0.0–0.5)
EOS%: 0.9 % (ref 0.0–7.0)
HEMATOCRIT: 35 % — AB (ref 38.4–49.9)
HEMOGLOBIN: 11.5 g/dL — AB (ref 13.0–17.1)
LYMPH#: 1.1 10*3/uL (ref 0.9–3.3)
LYMPH%: 28.2 % (ref 14.0–49.0)
MCH: 31.6 pg (ref 27.2–33.4)
MCHC: 32.9 g/dL (ref 32.0–36.0)
MCV: 96.2 fL (ref 79.3–98.0)
MONO#: 0.3 10*3/uL (ref 0.1–0.9)
MONO%: 8.9 % (ref 0.0–14.0)
NEUT#: 2.3 10*3/uL (ref 1.5–6.5)
NEUT%: 59.7 % (ref 39.0–75.0)
PLATELETS: 171 10*3/uL (ref 140–400)
RBC: 3.64 10*6/uL — ABNORMAL LOW (ref 4.20–5.82)
RDW: 17.5 % — AB (ref 11.0–14.6)
WBC: 3.9 10*3/uL — ABNORMAL LOW (ref 4.0–10.3)

## 2016-03-15 LAB — COMPREHENSIVE METABOLIC PANEL
ALBUMIN: 2.6 g/dL — AB (ref 3.5–5.0)
ALT: 9 U/L (ref 0–55)
AST: 20 U/L (ref 5–34)
Alkaline Phosphatase: 97 U/L (ref 40–150)
Anion Gap: 9 mEq/L (ref 3–11)
BUN: 8.4 mg/dL (ref 7.0–26.0)
CALCIUM: 9.1 mg/dL (ref 8.4–10.4)
CHLORIDE: 106 meq/L (ref 98–109)
CO2: 27 mEq/L (ref 22–29)
CREATININE: 1 mg/dL (ref 0.7–1.3)
EGFR: 85 mL/min/{1.73_m2} — ABNORMAL LOW (ref >90)
Glucose: 103 mg/dl (ref 70–140)
Potassium: 3.9 mEq/L (ref 3.5–5.1)
Sodium: 141 mEq/L (ref 136–145)
Total Bilirubin: 0.77 mg/dL (ref 0.20–1.20)
Total Protein: 6.3 g/dL — ABNORMAL LOW (ref 6.4–8.3)

## 2016-03-15 LAB — URINALYSIS, MICROSCOPIC - CHCC
BILIRUBIN (URINE): NEGATIVE
GLUCOSE UR CHCC: NEGATIVE mg/dL
Ketones: NEGATIVE mg/dL
NITRITE: NEGATIVE
PH: 6.5 (ref 4.6–8.0)
PROTEIN: 30 mg/dL
Specific Gravity, Urine: 1.01 (ref 1.003–1.035)
Urobilinogen, UR: 0.2 mg/dL (ref 0.2–1)

## 2016-03-15 MED ORDER — SODIUM CHLORIDE 0.9% FLUSH
10.0000 mL | INTRAVENOUS | Status: DC | PRN
  Administered 2016-03-15: 10 mL
  Filled 2016-03-15: qty 10

## 2016-03-15 MED ORDER — HEPARIN SOD (PORK) LOCK FLUSH 100 UNIT/ML IV SOLN
500.0000 [IU] | Freq: Once | INTRAVENOUS | Status: AC | PRN
  Administered 2016-03-15: 500 [IU]
  Filled 2016-03-15: qty 5

## 2016-03-15 NOTE — Progress Notes (Signed)
Pt stated "I fell in the laundry room after becoming dizzy. It happened suddenly." Pt denied hurting himself. Stated he just stopped taking bp meds. bp 93/50 bp in left arm when vts were taken. Advised pt to check bp frequently and when getting up to rise slowly. Pt understood. Porsche Cates LPN

## 2016-03-16 LAB — URINE CULTURE

## 2016-03-25 ENCOUNTER — Other Ambulatory Visit: Payer: Self-pay | Admitting: Oncology

## 2016-03-27 ENCOUNTER — Other Ambulatory Visit (HOSPITAL_BASED_OUTPATIENT_CLINIC_OR_DEPARTMENT_OTHER): Payer: Medicare Other

## 2016-03-27 ENCOUNTER — Ambulatory Visit (HOSPITAL_BASED_OUTPATIENT_CLINIC_OR_DEPARTMENT_OTHER): Payer: Medicare Other

## 2016-03-27 ENCOUNTER — Ambulatory Visit (HOSPITAL_BASED_OUTPATIENT_CLINIC_OR_DEPARTMENT_OTHER): Payer: Medicare Other | Admitting: Oncology

## 2016-03-27 ENCOUNTER — Other Ambulatory Visit: Payer: Medicare Other

## 2016-03-27 VITALS — BP 151/77 | HR 101 | Temp 97.8°F | Resp 20 | Ht 75.0 in | Wt 180.7 lb

## 2016-03-27 DIAGNOSIS — I10 Essential (primary) hypertension: Secondary | ICD-10-CM

## 2016-03-27 DIAGNOSIS — C250 Malignant neoplasm of head of pancreas: Secondary | ICD-10-CM

## 2016-03-27 DIAGNOSIS — Z5111 Encounter for antineoplastic chemotherapy: Secondary | ICD-10-CM

## 2016-03-27 DIAGNOSIS — Z452 Encounter for adjustment and management of vascular access device: Secondary | ICD-10-CM | POA: Diagnosis not present

## 2016-03-27 DIAGNOSIS — Z95828 Presence of other vascular implants and grafts: Secondary | ICD-10-CM

## 2016-03-27 LAB — COMPREHENSIVE METABOLIC PANEL
ALBUMIN: 2.8 g/dL — AB (ref 3.5–5.0)
ALK PHOS: 113 U/L (ref 40–150)
ALT: 8 U/L (ref 0–55)
AST: 16 U/L (ref 5–34)
Anion Gap: 10 mEq/L (ref 3–11)
BILIRUBIN TOTAL: 1.04 mg/dL (ref 0.20–1.20)
BUN: 10.3 mg/dL (ref 7.0–26.0)
CALCIUM: 9 mg/dL (ref 8.4–10.4)
CO2: 24 mEq/L (ref 22–29)
Chloride: 110 mEq/L — ABNORMAL HIGH (ref 98–109)
Creatinine: 0.8 mg/dL (ref 0.7–1.3)
Glucose: 90 mg/dl (ref 70–140)
POTASSIUM: 3.8 meq/L (ref 3.5–5.1)
SODIUM: 143 meq/L (ref 136–145)
TOTAL PROTEIN: 6.4 g/dL (ref 6.4–8.3)

## 2016-03-27 LAB — CBC WITH DIFFERENTIAL/PLATELET
BASO%: 1 % (ref 0.0–2.0)
BASOS ABS: 0 10*3/uL (ref 0.0–0.1)
EOS%: 2.9 % (ref 0.0–7.0)
Eosinophils Absolute: 0.1 10*3/uL (ref 0.0–0.5)
HCT: 33.8 % — ABNORMAL LOW (ref 38.4–49.9)
HGB: 11.3 g/dL — ABNORMAL LOW (ref 13.0–17.1)
LYMPH%: 39.5 % (ref 14.0–49.0)
MCH: 32.8 pg (ref 27.2–33.4)
MCHC: 33.4 g/dL (ref 32.0–36.0)
MCV: 98.1 fL — ABNORMAL HIGH (ref 79.3–98.0)
MONO#: 0.4 10*3/uL (ref 0.1–0.9)
MONO%: 10.9 % (ref 0.0–14.0)
NEUT#: 1.8 10*3/uL (ref 1.5–6.5)
NEUT%: 45.7 % (ref 39.0–75.0)
Platelets: 132 10*3/uL — ABNORMAL LOW (ref 140–400)
RBC: 3.45 10*6/uL — ABNORMAL LOW (ref 4.20–5.82)
RDW: 18.3 % — AB (ref 11.0–14.6)
WBC: 4 10*3/uL (ref 4.0–10.3)
lymph#: 1.6 10*3/uL (ref 0.9–3.3)

## 2016-03-27 MED ORDER — SODIUM CHLORIDE 0.9% FLUSH
10.0000 mL | INTRAVENOUS | Status: DC | PRN
  Filled 2016-03-27: qty 10

## 2016-03-27 MED ORDER — HEPARIN SOD (PORK) LOCK FLUSH 100 UNIT/ML IV SOLN
500.0000 [IU] | Freq: Once | INTRAVENOUS | Status: DC | PRN
  Filled 2016-03-27: qty 5

## 2016-03-27 MED ORDER — LEUCOVORIN CALCIUM INJECTION 350 MG
400.0000 mg/m2 | Freq: Once | INTRAVENOUS | Status: AC
  Administered 2016-03-27: 876 mg via INTRAVENOUS
  Filled 2016-03-27: qty 43.8

## 2016-03-27 MED ORDER — FLUOROURACIL CHEMO INJECTION 2.5 GM/50ML
400.0000 mg/m2 | Freq: Once | INTRAVENOUS | Status: AC
  Administered 2016-03-27: 900 mg via INTRAVENOUS
  Filled 2016-03-27: qty 18

## 2016-03-27 MED ORDER — SODIUM CHLORIDE 0.9% FLUSH
10.0000 mL | INTRAVENOUS | Status: DC | PRN
  Administered 2016-03-27: 10 mL via INTRAVENOUS
  Filled 2016-03-27: qty 10

## 2016-03-27 MED ORDER — SODIUM CHLORIDE 0.9 % IV SOLN
2400.0000 mg/m2 | INTRAVENOUS | Status: DC
  Administered 2016-03-27: 5250 mg via INTRAVENOUS
  Filled 2016-03-27: qty 105

## 2016-03-27 NOTE — Progress Notes (Signed)
  [place] OFFICE PROGRESS NOTE   Diagnosis: Pancreas cancer  INTERVAL HISTORY:   Dustin Lucas returns as scheduled. He completed a cycle of 5-fluorouracil beginning 03/13/2016. No mouth sores, nausea, or diarrhea. He reports feeling much better. His appetite has improved. He is ambulating in the home. No pain.  Objective:  Vital signs in last 24 hours:  Blood pressure (!) 151/77, pulse (!) 101, temperature 97.8 F (36.6 C), temperature source Oral, resp. rate 20, height 6\' 3"  (1.905 m), weight 180 lb 11.2 oz (82 kg), SpO2 100 %.    HEENT: No thrush or ulcers Resp: Lungs clear bilaterally Cardio: Regular rate and rhythm with premature beats GI: No hepatosplenomegaly, nontender, no mass Vascular: No leg edema  Portacath/PICC-without erythema  Lab Results:  Lab Results  Component Value Date   WBC 4.0 03/27/2016   HGB 11.3 (L) 03/27/2016   HCT 33.8 (L) 03/27/2016   MCV 98.1 (H) 03/27/2016   PLT 132 (L) 03/27/2016   NEUTROABS 1.8 03/27/2016   CA 19-9 on 03/13/2016:  93   Imaging:  No results found.  Medications: I have reviewed the patient's current medications.  Assessment/Plan: 1. Pancreas cancer-presenting with obstructive jaundice  CT 08/18/2015 with intra-and extrahepatic Dillard dilatation and dilatation of the pancreas duct, vague fullness at the head of the pancreas  EUS 10/05/2015 confirmed a pancreas head mass with no evidence of vascular invasion and no peripancreatic adenopathy (uT2,uN0)  EUS biopsy of the pancreas head mass on 10/05/2015 confirmed adenocarcinoma  CT chest[facility]abdomen at [facility] 11/16/2015-hypoenhancing mass in the head and uncinate process of the pancreas with increased markedpancreatic ductal dilatation and greater than 180 encasement of the superior mesenteric artery and superior mesenteric vein above the level of the first tributary.  Cycle 1 FOLFOX 12/12/2015  Cycle 2 FOLFOX 12/27/2015  Cycle 3 FOLFOX  01/24/2016-Neulasta added  Cycle 4 FOLFOX 02/14/2016  Restaging CT scans 03/02/2016-decreasing size of pancreatic head/uncinate process mass with decreased encasement of the SMV (less than 180) and decreased pancreatic ductal dilatation. There remains greater than 180 of encasement of the SMA which is patent. Unchanged position of colon bile duct stent with decreasing right-sided pneumobilia. Stable 6 mm right upper lobe nodule.  Cycle 5 FOLFOX 03/13/2016 (oxaliplatin on hold due to poor tolerance)  Cycle 6 FOLFOX 03/27/2016 (oxaliplatin held)  2. Obstructive jaundice secondary to #1-status postplacement of a bile duct stent  3. Hypertension  4. Anorexia/weight loss secondary to #1  5. 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 His performance status appears much improved. The CA 19-9 was lower 2 weeks ago. He will complete another cycle of 5-fluorouracil today. He will return for an office visit and chemotherapy in 2 weeks.  Betsy Coder, MD  03/27/2016  9:32 AM

## 2016-03-27 NOTE — Patient Instructions (Signed)
[place] Discharge Instructions for Patients Receiving Chemotherapy  Today you received the following chemotherapy agents:  Fluorouracil, Leucovorin,  To help prevent nausea and vomiting after your treatment, we encourage you to take your nausea medication as prescribed.   If you develop nausea and vomiting that is not controlled by your nausea medication, call the clinic.   BELOW ARE SYMPTOMS THAT SHOULD BE REPORTED IMMEDIATELY:  *FEVER GREATER THAN 100.5 F  *CHILLS WITH OR WITHOUT FEVER  NAUSEA AND VOMITING THAT IS NOT CONTROLLED WITH YOUR NAUSEA MEDICATION  *UNUSUAL SHORTNESS OF BREATH  *UNUSUAL BRUISING OR BLEEDING  TENDERNESS IN MOUTH AND THROAT WITH OR WITHOUT PRESENCE OF ULCERS  *URINARY PROBLEMS  *BOWEL PROBLEMS  UNUSUAL RASH Items with * indicate a potential emergency and should be followed up as soon as possible.  Feel free to call the clinic you have any questions or concerns. The clinic phone number is [phone].  Please show the [place] at check-in to the Emergency Department and triage nurse.

## 2016-03-28 LAB — CANCER ANTIGEN 19-9: CA 19-9: 66 U/mL — ABNORMAL HIGH (ref 0–35)

## 2016-03-29 ENCOUNTER — Ambulatory Visit (HOSPITAL_BASED_OUTPATIENT_CLINIC_OR_DEPARTMENT_OTHER): Payer: Medicare Other

## 2016-03-29 VITALS — BP 113/70 | HR 91 | Temp 98.5°F | Resp 18

## 2016-03-29 DIAGNOSIS — Z452 Encounter for adjustment and management of vascular access device: Secondary | ICD-10-CM | POA: Diagnosis not present

## 2016-03-29 DIAGNOSIS — C250 Malignant neoplasm of head of pancreas: Secondary | ICD-10-CM | POA: Diagnosis not present

## 2016-03-29 MED ORDER — HEPARIN SOD (PORK) LOCK FLUSH 100 UNIT/ML IV SOLN
500.0000 [IU] | Freq: Once | INTRAVENOUS | Status: AC | PRN
  Administered 2016-03-29: 500 [IU]
  Filled 2016-03-29: qty 5

## 2016-03-29 MED ORDER — SODIUM CHLORIDE 0.9% FLUSH
10.0000 mL | INTRAVENOUS | Status: DC | PRN
  Administered 2016-03-29: 10 mL
  Filled 2016-03-29: qty 10

## 2016-03-29 NOTE — Patient Instructions (Signed)
Implanted Port Home Guide An implanted port is a type of central line that is placed under the skin. Central lines are used to provide IV access when treatment or nutrition needs to be given through a person's veins. Implanted ports are used for long-term IV access. An implanted port may be placed because:  You need IV medicine that would be irritating to the small veins in your hands or arms.  You need long-term IV medicines, such as antibiotics.  You need IV nutrition for a long period.  You need frequent blood draws for lab tests.  You need dialysis.  Implanted ports are usually placed in the chest area, but they can also be placed in the upper arm, the abdomen, or the leg. An implanted port has two main parts:  Reservoir. The reservoir is round and will appear as a small, raised area under your skin. The reservoir is the part where a needle is inserted to give medicines or draw blood.  Catheter. The catheter is a thin, flexible tube that extends from the reservoir. The catheter is placed into a large vein. Medicine that is inserted into the reservoir goes into the catheter and then into the vein.  How will I care for my incision site? Do not get the incision site wet. Bathe or shower as directed by your health care provider. How is my port accessed? Special steps must be taken to access the port:  Before the port is accessed, a numbing cream can be placed on the skin. This helps numb the skin over the port site.  Your health care provider uses a sterile technique to access the port. ? Your health care provider must put on a mask and sterile gloves. ? The skin over your port is cleaned carefully with an antiseptic and allowed to dry. ? The port is gently pinched between sterile gloves, and a needle is inserted into the port.  Only "non-coring" port needles should be used to access the port. Once the port is accessed, a blood return should be checked. This helps ensure that the port  is in the vein and is not clogged.  If your port needs to remain accessed for a constant infusion, a clear (transparent) bandage will be placed over the needle site. The bandage and needle will need to be changed every week, or as directed by your health care provider.  Keep the bandage covering the needle clean and dry. Do not get it wet. Follow your health care provider's instructions on how to take a shower or bath while the port is accessed.  If your port does not need to stay accessed, no bandage is needed over the port.  What is flushing? Flushing helps keep the port from getting clogged. Follow your health care provider's instructions on how and when to flush the port. Ports are usually flushed with saline solution or a medicine called heparin. The need for flushing will depend on how the port is used.  If the port is used for intermittent medicines or blood draws, the port will need to be flushed: ? After medicines have been given. ? After blood has been drawn. ? As part of routine maintenance.  If a constant infusion is running, the port may not need to be flushed.  How long will my port stay implanted? The port can stay in for as long as your health care provider thinks it is needed. When it is time for the port to come out, surgery will be   done to remove it. The procedure is similar to the one performed when the port was put in. When should I seek immediate medical care? When you have an implanted port, you should seek immediate medical care if:  You notice a bad smell coming from the incision site.  You have swelling, redness, or drainage at the incision site.  You have more swelling or pain at the port site or the surrounding area.  You have a fever that is not controlled with medicine.  This information is not intended to replace advice given to you by your health care provider. Make sure you discuss any questions you have with your health care provider. Document  Released: 12/18/2004 Document Revised: 05/26/2015 Document Reviewed: 08/25/2012 Elsevier Interactive Patient Education  2017 [employer]  

## 2016-03-30 ENCOUNTER — Telehealth: Payer: Self-pay | Admitting: Oncology

## 2016-03-30 NOTE — Telephone Encounter (Signed)
lvm to inform pt of 4/10 appts per LOS

## 2016-04-07 ENCOUNTER — Other Ambulatory Visit: Payer: Self-pay | Admitting: Oncology

## 2016-04-10 ENCOUNTER — Ambulatory Visit (HOSPITAL_BASED_OUTPATIENT_CLINIC_OR_DEPARTMENT_OTHER): Payer: Medicare Other | Admitting: Nurse Practitioner

## 2016-04-10 ENCOUNTER — Other Ambulatory Visit (HOSPITAL_BASED_OUTPATIENT_CLINIC_OR_DEPARTMENT_OTHER): Payer: Medicare Other

## 2016-04-10 ENCOUNTER — Other Ambulatory Visit: Payer: Medicare Other

## 2016-04-10 ENCOUNTER — Ambulatory Visit (HOSPITAL_BASED_OUTPATIENT_CLINIC_OR_DEPARTMENT_OTHER): Payer: Medicare Other

## 2016-04-10 VITALS — BP 129/86 | HR 91 | Temp 98.4°F | Resp 18

## 2016-04-10 DIAGNOSIS — C250 Malignant neoplasm of head of pancreas: Secondary | ICD-10-CM | POA: Diagnosis not present

## 2016-04-10 DIAGNOSIS — Z95828 Presence of other vascular implants and grafts: Secondary | ICD-10-CM

## 2016-04-10 DIAGNOSIS — D709 Neutropenia, unspecified: Secondary | ICD-10-CM | POA: Diagnosis not present

## 2016-04-10 DIAGNOSIS — Z452 Encounter for adjustment and management of vascular access device: Secondary | ICD-10-CM | POA: Diagnosis not present

## 2016-04-10 DIAGNOSIS — Z5111 Encounter for antineoplastic chemotherapy: Secondary | ICD-10-CM | POA: Diagnosis not present

## 2016-04-10 DIAGNOSIS — I10 Essential (primary) hypertension: Secondary | ICD-10-CM | POA: Diagnosis not present

## 2016-04-10 LAB — CBC WITH DIFFERENTIAL/PLATELET
BASO%: 0.3 % (ref 0.0–2.0)
Basophils Absolute: 0 10*3/uL (ref 0.0–0.1)
EOS%: 1.2 % (ref 0.0–7.0)
Eosinophils Absolute: 0 10*3/uL (ref 0.0–0.5)
HEMATOCRIT: 34.8 % — AB (ref 38.4–49.9)
HEMOGLOBIN: 11.4 g/dL — AB (ref 13.0–17.1)
LYMPH%: 54.7 % — ABNORMAL HIGH (ref 14.0–49.0)
MCH: 32.3 pg (ref 27.2–33.4)
MCHC: 32.8 g/dL (ref 32.0–36.0)
MCV: 98.6 fL — ABNORMAL HIGH (ref 79.3–98.0)
MONO#: 0.3 10*3/uL (ref 0.1–0.9)
MONO%: 8.1 % (ref 0.0–14.0)
NEUT%: 35.7 % — ABNORMAL LOW (ref 39.0–75.0)
NEUTROS ABS: 1.2 10*3/uL — AB (ref 1.5–6.5)
Platelets: 139 10*3/uL — ABNORMAL LOW (ref 140–400)
RBC: 3.53 10*6/uL — ABNORMAL LOW (ref 4.20–5.82)
RDW: 18 % — ABNORMAL HIGH (ref 11.0–14.6)
WBC: 3.4 10*3/uL — ABNORMAL LOW (ref 4.0–10.3)
lymph#: 1.9 10*3/uL (ref 0.9–3.3)

## 2016-04-10 LAB — COMPREHENSIVE METABOLIC PANEL
ALBUMIN: 3 g/dL — AB (ref 3.5–5.0)
ALK PHOS: 115 U/L (ref 40–150)
ALT: 6 U/L (ref 0–55)
AST: 16 U/L (ref 5–34)
Anion Gap: 10 mEq/L (ref 3–11)
BILIRUBIN TOTAL: 1.31 mg/dL — AB (ref 0.20–1.20)
BUN: 9 mg/dL (ref 7.0–26.0)
CO2: 25 mEq/L (ref 22–29)
CREATININE: 0.8 mg/dL (ref 0.7–1.3)
Calcium: 9.1 mg/dL (ref 8.4–10.4)
Chloride: 107 mEq/L (ref 98–109)
EGFR: >90 mL/min/{1.73_m2} (ref >90)
Glucose: 89 mg/dl (ref 70–140)
Potassium: 3.8 mEq/L (ref 3.5–5.1)
Sodium: 143 mEq/L (ref 136–145)
TOTAL PROTEIN: 6.5 g/dL (ref 6.4–8.3)

## 2016-04-10 MED ORDER — FLUOROURACIL CHEMO INJECTION 2.5 GM/50ML
400.0000 mg/m2 | Freq: Once | INTRAVENOUS | Status: AC
  Administered 2016-04-10: 900 mg via INTRAVENOUS
  Filled 2016-04-10: qty 18

## 2016-04-10 MED ORDER — SODIUM CHLORIDE 0.9% FLUSH
10.0000 mL | INTRAVENOUS | Status: DC | PRN
  Administered 2016-04-10: 10 mL via INTRAVENOUS
  Filled 2016-04-10: qty 10

## 2016-04-10 MED ORDER — SODIUM CHLORIDE 0.9 % IV SOLN
2400.0000 mg/m2 | INTRAVENOUS | Status: DC
  Administered 2016-04-10: 5250 mg via INTRAVENOUS
  Filled 2016-04-10: qty 105

## 2016-04-10 MED ORDER — LEUCOVORIN CALCIUM INJECTION 350 MG
400.0000 mg/m2 | Freq: Once | INTRAVENOUS | Status: AC
  Administered 2016-04-10: 876 mg via INTRAVENOUS
  Filled 2016-04-10: qty 43.8

## 2016-04-10 NOTE — Progress Notes (Signed)
Per Ned Card, ok to treat with ANC of 1.2.

## 2016-04-10 NOTE — Progress Notes (Signed)
  [place] OFFICE PROGRESS NOTE   Diagnosis:  Pancreas cancer  INTERVAL HISTORY:   Dustin Lucas returns as scheduled. He completed another cycle of 5-fluorouracil 03/27/2016. He denies nausea/vomiting. No mouth sores. Intermittent loose stools. No hand or foot pain or redness. He denies abdominal pain. Appetite continues to be improved.  Objective:  Vital signs in last 24 hours:  There were no vitals taken for this visit.    HEENT: No thrush or ulcers. Resp: Lungs clear bilaterally. Cardio: Regular rate and rhythm. GI: Abdomen soft and nontender. No hepatomegaly. Vascular: No leg edema. Skin: Palms with hyperpigmentation. No erythema. Port-A-Cath without erythema.    Lab Results:  Lab Results  Component Value Date   WBC 3.4 (L) 04/10/2016   HGB 11.4 (L) 04/10/2016   HCT 34.8 (L) 04/10/2016   MCV 98.6 (H) 04/10/2016   PLT 139 (L) 04/10/2016   NEUTROABS 1.2 (L) 04/10/2016    Imaging:  No results found.  Medications: I have reviewed the patient's current medications.  Assessment/Plan: 1. Pancreas cancer-presenting with obstructive jaundice  CT 08/18/2015 with intra-and extrahepatic Dillard dilatation and dilatation of the pancreas duct, vague fullness at the head of the pancreas  EUS 10/05/2015 confirmed a pancreas head mass with no evidence of vascular invasion and no peripancreatic adenopathy (uT2,uN0)  EUS biopsy of the pancreas head mass on 10/05/2015 confirmed adenocarcinoma  CT chest[facility]abdomen at [facility] 11/16/2015-hypoenhancing mass in the head and uncinate process of the pancreas with increased markedpancreatic ductal dilatation and greater than 180 encasement of the superior mesenteric artery and superior mesenteric vein above the level of the first tributary.  Cycle 1 FOLFOX 12/12/2015  Cycle 2 FOLFOX 12/27/2015  Cycle 3 FOLFOX 01/24/2016-Neulasta added  Cycle 4 FOLFOX 02/14/2016  Restaging CT scans 03/02/2016-decreasing size of  pancreatic head/uncinate process mass with decreased encasement of the SMV (less than 180) and decreased pancreatic ductal dilatation. There remains greater than 180 of encasement of the SMA which is patent. Unchanged position of colon bile duct stent with decreasing right-sided pneumobilia. Stable 6 mm right upper lobe nodule.  Cycle 5 FOLFOX 03/13/2016 (oxaliplatin on hold due to poor tolerance)  Cycle 6 FOLFOX 03/27/2016 (oxaliplatin held)  Cycle 7 FOLFOX 04/10/2016 (oxaliplatin held)  2. Obstructive jaundice secondary to #1-status postplacement of a bile duct stent  3. Hypertension  4. Anorexia/weight loss secondary to #1  5. 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 His performance status continues to be improved. Proceed with cycle 7 FOLFOX today as scheduled.   We discussed the mild neutropenia on labs today. He understands to contact the office with fever, chills, other signs of infection.   He will return for a follow-up visit and cycle 8 in 2 weeks.    Ned Card ANP/GNP-BC   04/10/2016  2:53 PM

## 2016-04-10 NOTE — Patient Instructions (Signed)
[place] Discharge Instructions for Patients Receiving Chemotherapy  Today you received the following chemotherapy agents:  Fluorouracil, Leucovorin,  To help prevent nausea and vomiting after your treatment, we encourage you to take your nausea medication as prescribed.   If you develop nausea and vomiting that is not controlled by your nausea medication, call the clinic.   BELOW ARE SYMPTOMS THAT SHOULD BE REPORTED IMMEDIATELY:  *FEVER GREATER THAN 100.5 F  *CHILLS WITH OR WITHOUT FEVER  NAUSEA AND VOMITING THAT IS NOT CONTROLLED WITH YOUR NAUSEA MEDICATION  *UNUSUAL SHORTNESS OF BREATH  *UNUSUAL BRUISING OR BLEEDING  TENDERNESS IN MOUTH AND THROAT WITH OR WITHOUT PRESENCE OF ULCERS  *URINARY PROBLEMS  *BOWEL PROBLEMS  UNUSUAL RASH Items with * indicate a potential emergency and should be followed up as soon as possible.  Feel free to call the clinic you have any questions or concerns. The clinic phone number is [phone].  Please show the [place] at check-in to the Emergency Department and triage nurse.

## 2016-04-11 LAB — CANCER ANTIGEN 19-9: CA 19-9: 70 U/mL — ABNORMAL HIGH (ref 0–35)

## 2016-04-12 ENCOUNTER — Ambulatory Visit (HOSPITAL_BASED_OUTPATIENT_CLINIC_OR_DEPARTMENT_OTHER): Payer: Medicare Other

## 2016-04-12 VITALS — BP 141/74 | HR 90 | Temp 99.3°F | Resp 18

## 2016-04-12 DIAGNOSIS — Z452 Encounter for adjustment and management of vascular access device: Secondary | ICD-10-CM | POA: Diagnosis not present

## 2016-04-12 DIAGNOSIS — C250 Malignant neoplasm of head of pancreas: Secondary | ICD-10-CM

## 2016-04-12 MED ORDER — HEPARIN SOD (PORK) LOCK FLUSH 100 UNIT/ML IV SOLN
500.0000 [IU] | Freq: Once | INTRAVENOUS | Status: AC | PRN
  Administered 2016-04-12: 500 [IU]
  Filled 2016-04-12: qty 5

## 2016-04-12 MED ORDER — SODIUM CHLORIDE 0.9% FLUSH
10.0000 mL | INTRAVENOUS | Status: DC | PRN
  Administered 2016-04-12: 10 mL
  Filled 2016-04-12: qty 10

## 2016-04-12 NOTE — Progress Notes (Signed)
Pt temp was 99.3 today. Pt stated he has not run a fever and denies having chills.  Discussed with pt the importance of taking his temperature daily. Pt verbalized that he has a thermometer at home and will take temp daily and will call if temp reached 100.5.

## 2016-04-12 NOTE — Patient Instructions (Signed)
[place] Discharge Instructions for Patients Receiving Chemotherapy  Today you received the following chemotherapy agents:  Fluorouracil, Leucovorin,  To help prevent nausea and vomiting after your treatment, we encourage you to take your nausea medication as prescribed.   If you develop nausea and vomiting that is not controlled by your nausea medication, call the clinic.   BELOW ARE SYMPTOMS THAT SHOULD BE REPORTED IMMEDIATELY:  *FEVER GREATER THAN 100.5 F  *CHILLS WITH OR WITHOUT FEVER  NAUSEA AND VOMITING THAT IS NOT CONTROLLED WITH YOUR NAUSEA MEDICATION  *UNUSUAL SHORTNESS OF BREATH  *UNUSUAL BRUISING OR BLEEDING  TENDERNESS IN MOUTH AND THROAT WITH OR WITHOUT PRESENCE OF ULCERS  *URINARY PROBLEMS  *BOWEL PROBLEMS  UNUSUAL RASH Items with * indicate a potential emergency and should be followed up as soon as possible.  Feel free to call the clinic you have any questions or concerns. The clinic phone number is [phone].  Please show the [place] at check-in to the Emergency Department and triage nurse.   Implanted [facility] Guide An implanted port is a type of central line that is placed under the skin. Central lines are used to provide IV access when treatment or nutrition needs to be given through a person's veins. Implanted ports are used for long-term IV access. An implanted port may be placed because:  You need IV medicine that would be irritating to the small veins in your hands or arms.  You need long-term IV medicines, such as antibiotics.  You need IV nutrition for a long period.  You need frequent blood draws for lab tests.  You need dialysis. Implanted ports are usually placed in the chest area, but they can also be placed in the upper arm, the abdomen, or the leg. An implanted port has two main parts:  Reservoir. The reservoir is round and will appear as a small, raised area under your skin. The reservoir is the  part where a needle is inserted to give medicines or draw blood.  Catheter. The catheter is a thin, flexible tube that extends from the reservoir. The catheter is placed into a large vein. Medicine that is inserted into the reservoir goes into the catheter and then into the vein. How will I care for my incision site? Do not get the incision site wet. Bathe or shower as directed by your health care provider. How is my port accessed? Special steps must be taken to access the port:  Before the port is accessed, a numbing cream can be placed on the skin. This helps numb the skin over the port site.  Your health care provider uses a sterile technique to access the port.  Your health care provider must put on a mask and sterile gloves.  The skin over your port is cleaned carefully with an antiseptic and allowed to dry.  The port is gently pinched between sterile gloves, and a needle is inserted into the port.  Only "non-coring" port needles should be used to access the port. Once the port is accessed, a blood return should be checked. This helps ensure that the port is in the vein and is not clogged.  If your port needs to remain accessed for a constant infusion, a clear (transparent) bandage will be placed over the needle site. The bandage and needle will need to be changed every week, or as directed by your health care provider.  Keep the bandage covering the needle clean and dry. Do not  get it wet. Follow your health care provider's instructions on how to take a shower or bath while the port is accessed.  If your port does not need to stay accessed, no bandage is needed over the port. What is flushing? Flushing helps keep the port from getting clogged. Follow your health care provider's instructions on how and when to flush the port. Ports are usually flushed with saline solution or a medicine called heparin. The need for flushing will depend on how the port is used.  If the port is used for  intermittent medicines or blood draws, the port will need to be flushed:  After medicines have been given.  After blood has been drawn.  As part of routine maintenance.  If a constant infusion is running, the port may not need to be flushed. How long will my port stay implanted? The port can stay in for as long as your health care provider thinks it is needed. When it is time for the port to come out, surgery will be done to remove it. The procedure is similar to the one performed when the port was put in. When should I seek immediate medical care? When you have an implanted port, you should seek immediate medical care if:  You notice a bad smell coming from the incision site.  You have swelling, redness, or drainage at the incision site.  You have more swelling or pain at the port site or the surrounding area.  You have a fever that is not controlled with medicine. This information is not intended to replace advice given to you by your health care provider. Make sure you discuss any questions you have with your health care provider. Document Released: 12/18/2004 Document Revised: 05/26/2015 Document Reviewed: 08/25/2012 Elsevier Interactive Patient Education  2017 [employer].

## 2016-04-22 ENCOUNTER — Other Ambulatory Visit: Payer: Self-pay | Admitting: Oncology

## 2016-04-24 ENCOUNTER — Ambulatory Visit (HOSPITAL_BASED_OUTPATIENT_CLINIC_OR_DEPARTMENT_OTHER): Payer: Medicare Other

## 2016-04-24 ENCOUNTER — Telehealth: Payer: Self-pay | Admitting: Oncology

## 2016-04-24 ENCOUNTER — Other Ambulatory Visit (HOSPITAL_BASED_OUTPATIENT_CLINIC_OR_DEPARTMENT_OTHER): Payer: Medicare Other

## 2016-04-24 ENCOUNTER — Ambulatory Visit (HOSPITAL_BASED_OUTPATIENT_CLINIC_OR_DEPARTMENT_OTHER): Payer: Medicare Other | Admitting: Nurse Practitioner

## 2016-04-24 VITALS — BP 133/81 | HR 92 | Temp 98.0°F | Resp 17 | Wt 181.1 lb

## 2016-04-24 DIAGNOSIS — Z452 Encounter for adjustment and management of vascular access device: Secondary | ICD-10-CM | POA: Diagnosis not present

## 2016-04-24 DIAGNOSIS — Z5111 Encounter for antineoplastic chemotherapy: Secondary | ICD-10-CM

## 2016-04-24 DIAGNOSIS — I10 Essential (primary) hypertension: Secondary | ICD-10-CM | POA: Diagnosis not present

## 2016-04-24 DIAGNOSIS — D701 Agranulocytosis secondary to cancer chemotherapy: Secondary | ICD-10-CM

## 2016-04-24 DIAGNOSIS — C250 Malignant neoplasm of head of pancreas: Secondary | ICD-10-CM

## 2016-04-24 DIAGNOSIS — Z95828 Presence of other vascular implants and grafts: Secondary | ICD-10-CM

## 2016-04-24 LAB — COMPREHENSIVE METABOLIC PANEL
ALBUMIN: 3 g/dL — AB (ref 3.5–5.0)
ALK PHOS: 117 U/L (ref 40–150)
ALT: 7 U/L (ref 0–55)
AST: 17 U/L (ref 5–34)
Anion Gap: 10 mEq/L (ref 3–11)
BILIRUBIN TOTAL: 1.31 mg/dL — AB (ref 0.20–1.20)
BUN: 9.8 mg/dL (ref 7.0–26.0)
CALCIUM: 9.3 mg/dL (ref 8.4–10.4)
CO2: 26 mEq/L (ref 22–29)
Chloride: 107 mEq/L (ref 98–109)
Creatinine: 1 mg/dL (ref 0.7–1.3)
EGFR: 89 mL/min/{1.73_m2} — AB (ref >90)
Glucose: 91 mg/dl (ref 70–140)
Potassium: 3.9 mEq/L (ref 3.5–5.1)
Sodium: 142 mEq/L (ref 136–145)
Total Protein: 6.4 g/dL (ref 6.4–8.3)

## 2016-04-24 LAB — CBC WITH DIFFERENTIAL/PLATELET
BASO%: 0.5 % (ref 0.0–2.0)
Basophils Absolute: 0 10*3/uL (ref 0.0–0.1)
EOS ABS: 0 10*3/uL (ref 0.0–0.5)
EOS%: 1.4 % (ref 0.0–7.0)
HEMATOCRIT: 36.3 % — AB (ref 38.4–49.9)
HEMOGLOBIN: 11.9 g/dL — AB (ref 13.0–17.1)
LYMPH#: 1.6 10*3/uL (ref 0.9–3.3)
LYMPH%: 45 % (ref 14.0–49.0)
MCH: 32.9 pg (ref 27.2–33.4)
MCHC: 32.7 g/dL (ref 32.0–36.0)
MCV: 100.9 fL — AB (ref 79.3–98.0)
MONO#: 0.4 10*3/uL (ref 0.1–0.9)
MONO%: 11.5 % (ref 0.0–14.0)
NEUT#: 1.4 10*3/uL — ABNORMAL LOW (ref 1.5–6.5)
NEUT%: 41.6 % (ref 39.0–75.0)
PLATELETS: 131 10*3/uL — AB (ref 140–400)
RBC: 3.6 10*6/uL — ABNORMAL LOW (ref 4.20–5.82)
RDW: 18.8 % — ABNORMAL HIGH (ref 11.0–14.6)
WBC: 3.5 10*3/uL — ABNORMAL LOW (ref 4.0–10.3)

## 2016-04-24 MED ORDER — SODIUM CHLORIDE 0.9 % IV SOLN
Freq: Once | INTRAVENOUS | Status: AC
  Administered 2016-04-24: 14:00:00 via INTRAVENOUS

## 2016-04-24 MED ORDER — DEXTROSE 5 % IV SOLN
400.0000 mg/m2 | Freq: Once | INTRAVENOUS | Status: AC
  Administered 2016-04-24: 876 mg via INTRAVENOUS
  Filled 2016-04-24: qty 43.8

## 2016-04-24 MED ORDER — SODIUM CHLORIDE 0.9 % IV SOLN
2400.0000 mg/m2 | INTRAVENOUS | Status: DC
  Administered 2016-04-24: 5250 mg via INTRAVENOUS
  Filled 2016-04-24: qty 105

## 2016-04-24 MED ORDER — SODIUM CHLORIDE 0.9% FLUSH
10.0000 mL | INTRAVENOUS | Status: DC | PRN
  Administered 2016-04-24: 10 mL via INTRAVENOUS
  Filled 2016-04-24: qty 10

## 2016-04-24 MED ORDER — FLUOROURACIL CHEMO INJECTION 2.5 GM/50ML
400.0000 mg/m2 | Freq: Once | INTRAVENOUS | Status: AC
  Administered 2016-04-24: 900 mg via INTRAVENOUS
  Filled 2016-04-24: qty 18

## 2016-04-24 NOTE — Patient Instructions (Signed)
[facility] Discharge Instructions for Patients Receiving Chemotherapy  Today you received the following chemotherapy agents: Leucovorin and 5FU.  To help prevent nausea and vomiting after your treatment, we encourage you to take your nausea medication: as directed.   If you develop nausea and vomiting that is not controlled by your nausea medication, call the clinic.   BELOW ARE SYMPTOMS THAT SHOULD BE REPORTED IMMEDIATELY:  *FEVER GREATER THAN 100.5 F  *CHILLS WITH OR WITHOUT FEVER  NAUSEA AND VOMITING THAT IS NOT CONTROLLED WITH YOUR NAUSEA MEDICATION  *UNUSUAL SHORTNESS OF BREATH  *UNUSUAL BRUISING OR BLEEDING  TENDERNESS IN MOUTH AND THROAT WITH OR WITHOUT PRESENCE OF ULCERS  *URINARY PROBLEMS  *BOWEL PROBLEMS  UNUSUAL RASH Items with * indicate a potential emergency and should be followed up as soon as possible.  Feel free to call the clinic you have any questions or concerns. The clinic phone number is [phone].  Please show the CHEMO ALERT CARD at check-in to the Emergency Department and triage nurse.   

## 2016-04-24 NOTE — Progress Notes (Signed)
  [place] OFFICE PROGRESS NOTE   Diagnosis:  Pancreas cancer  INTERVAL HISTORY:   Dustin Lucas returns as scheduled. He completed another cycle of 5-fluorouracil 04/10/2016. He denies nausea/vomiting. No mouth sores. He had a "bout" of diarrhea on 04/20/2016. He took Imodium with good relief. Appetite continues to be improved. He reports weight is stable.  Objective:  Vital signs in last 24 hours:  Blood pressure 133/81, pulse 92, temperature 98 F (36.7 C), resp. rate 17, weight 181 lb 1 oz (82.1 kg), SpO2 100 %.    HEENT: No thrush or ulcers. Resp: Lungs clear bilaterally. Cardio: Regular rate and rhythm. GI: Abdomen soft and nontender. No hepatomegaly. No mass. Vascular: No leg edema.  Skin: Palms with hyperpigmentation. No erythema or skin breakdown. Port-A-Cath without erythema.    Lab Results:  Lab Results  Component Value Date   WBC 3.5 (L) 04/24/2016   HGB 11.9 (L) 04/24/2016   HCT 36.3 (L) 04/24/2016   MCV 100.9 (H) 04/24/2016   PLT 131 (L) 04/24/2016   NEUTROABS 1.4 (L) 04/24/2016    Imaging:  No results found.  Medications: I have reviewed the patient's current medications.  Assessment/Plan: 1. Pancreas cancer-presenting with obstructive jaundice  CT 08/18/2015 with intra-and extrahepatic Dillard dilatation and dilatation of the pancreas duct, vague fullness at the head of the pancreas  EUS 10/05/2015 confirmed a pancreas head mass with no evidence of vascular invasion and no peripancreatic adenopathy (uT2,uN0)  EUS biopsy of the pancreas head mass on 10/05/2015 confirmed adenocarcinoma  CT chest[facility]abdomen at [facility] 11/16/2015-hypoenhancing mass in the head and uncinate process of the pancreas with increased markedpancreatic ductal dilatation and greater than 180 encasement of the superior mesenteric artery and superior mesenteric vein above the level of the first tributary.  Cycle 1 FOLFOX 12/12/2015  Cycle 2 FOLFOX  12/27/2015  Cycle 3 FOLFOX 01/24/2016-Neulasta added  Cycle 4 FOLFOX 02/14/2016  Restaging CT scans 03/02/2016-decreasing size of pancreatic head/uncinate process mass with decreased encasement of the SMV (less than 180) and decreased pancreatic ductal dilatation. There remains greater than 180 of encasement of the SMA which is patent. Unchanged position of colon bile duct stent with decreasing right-sided pneumobilia. Stable 6 mm right upper lobe nodule.  Cycle 5 FOLFOX 03/13/2016 (oxaliplatin on hold due to poor tolerance)  Cycle 6 FOLFOX 03/27/2016 (oxaliplatin held)  Cycle 7 FOLFOX 04/10/2016 (oxaliplatin held)  Cycle 8 FOLFOX 04/24/2016 (oxaliplatin held)  2. Obstructive jaundice secondary to #1-status postplacement of a bile duct stent  3. Hypertension  4. Anorexia/weight loss secondary to #1  5. 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 He has completed 7 cycles of FOLFOX. Plan to proceed with cycle 8 today as scheduled. He has follow-up with Dr. Aleatha Borer at [facility] next week. He will return for a follow-up visit here in 2 weeks. He will contact the office in the interim with any problems.    Ned Card ANP/GNP-BC   04/24/2016  12:56 PM

## 2016-04-24 NOTE — Progress Notes (Signed)
OK to treat with ANC-1.4 per L. Marcello Moores NP.

## 2016-04-24 NOTE — Telephone Encounter (Signed)
Gave patient AVS and calender per 4/24 los.  

## 2016-04-25 LAB — CANCER ANTIGEN 19-9: CA 19-9: 58 U/mL — ABNORMAL HIGH (ref 0–35)

## 2016-04-26 ENCOUNTER — Ambulatory Visit (HOSPITAL_BASED_OUTPATIENT_CLINIC_OR_DEPARTMENT_OTHER): Payer: Medicare Other

## 2016-04-26 VITALS — BP 142/60 | HR 80 | Temp 98.7°F | Resp 18

## 2016-04-26 DIAGNOSIS — Z452 Encounter for adjustment and management of vascular access device: Secondary | ICD-10-CM | POA: Diagnosis not present

## 2016-04-26 DIAGNOSIS — C250 Malignant neoplasm of head of pancreas: Secondary | ICD-10-CM

## 2016-04-26 MED ORDER — HEPARIN SOD (PORK) LOCK FLUSH 100 UNIT/ML IV SOLN
500.0000 [IU] | Freq: Once | INTRAVENOUS | Status: AC | PRN
  Administered 2016-04-26: 500 [IU]
  Filled 2016-04-26: qty 5

## 2016-04-26 MED ORDER — SODIUM CHLORIDE 0.9% FLUSH
10.0000 mL | INTRAVENOUS | Status: DC | PRN
  Administered 2016-04-26: 10 mL
  Filled 2016-04-26: qty 10

## 2016-05-05 ENCOUNTER — Other Ambulatory Visit: Payer: Self-pay | Admitting: Oncology

## 2016-05-08 ENCOUNTER — Ambulatory Visit: Payer: Medicare Other

## 2016-05-08 ENCOUNTER — Other Ambulatory Visit: Payer: Medicare Other

## 2016-05-08 ENCOUNTER — Ambulatory Visit: Payer: Medicare Other | Admitting: Oncology

## 2016-05-08 NOTE — Progress Notes (Signed)
Nutrition  Patient did not show up for appointments and infusion today so was not able to complete nutrition follow-up.  Will follow-up as able.  Aslin Farinas B. Zenia Resides, [place], [place] Registered Dietitian [phone] (pager)

## 2016-08-20 ENCOUNTER — Telehealth: Payer: Self-pay | Admitting: *Deleted

## 2016-08-20 NOTE — Telephone Encounter (Signed)
Left message on voicemail for pt to call office.  Does he want to schedule follow up here? Pt is seeing Dr. Aleatha Borer at [facility].  OK to follow up here as well or continue at [facility] only if pt prefers. Need to be sure his port is being flushed.

## 2016-08-24 NOTE — Telephone Encounter (Signed)
Called pt, he requests to be scheduled to see Dr. Benay Spice "sometime next week." He has not had port flush at [facility]. Message to schedulers for office/ port flush.

## 2016-08-27 ENCOUNTER — Ambulatory Visit: Payer: Medicare Other | Admitting: Nurse Practitioner

## 2016-08-27 ENCOUNTER — Telehealth: Payer: Self-pay | Admitting: Nurse Practitioner

## 2016-08-27 NOTE — Telephone Encounter (Signed)
Left voicemail for patient regarding his upcoming appts on 8/30.

## 2016-08-30 ENCOUNTER — Ambulatory Visit (HOSPITAL_BASED_OUTPATIENT_CLINIC_OR_DEPARTMENT_OTHER): Payer: Medicare Other

## 2016-08-30 ENCOUNTER — Telehealth: Payer: Self-pay | Admitting: Oncology

## 2016-08-30 ENCOUNTER — Ambulatory Visit (HOSPITAL_BASED_OUTPATIENT_CLINIC_OR_DEPARTMENT_OTHER): Payer: Medicare Other | Admitting: Nurse Practitioner

## 2016-08-30 VITALS — BP 142/72 | HR 77 | Temp 98.0°F | Resp 16

## 2016-08-30 VITALS — BP 140/80 | HR 74 | Temp 99.2°F | Resp 18 | Ht 75.0 in | Wt 179.5 lb

## 2016-08-30 DIAGNOSIS — C250 Malignant neoplasm of head of pancreas: Secondary | ICD-10-CM | POA: Diagnosis not present

## 2016-08-30 DIAGNOSIS — Z95828 Presence of other vascular implants and grafts: Secondary | ICD-10-CM

## 2016-08-30 DIAGNOSIS — Z452 Encounter for adjustment and management of vascular access device: Secondary | ICD-10-CM | POA: Diagnosis not present

## 2016-08-30 MED ORDER — HEPARIN SOD (PORK) LOCK FLUSH 100 UNIT/ML IV SOLN
500.0000 [IU] | Freq: Once | INTRAVENOUS | Status: AC | PRN
  Administered 2016-08-30: 500 [IU] via INTRAVENOUS
  Filled 2016-08-30: qty 5

## 2016-08-30 MED ORDER — SODIUM CHLORIDE 0.9% FLUSH
10.0000 mL | INTRAVENOUS | Status: DC | PRN
  Administered 2016-08-30: 10 mL via INTRAVENOUS
  Filled 2016-08-30: qty 10

## 2016-08-30 NOTE — Progress Notes (Signed)
[place] OFFICE PROGRESS NOTE   Diagnosis:  Pancreas cancer  INTERVAL HISTORY:   Mr. Bogan returns for a follow-up visit. He completed cycle 8 FOLFOX 04/24/2016. He did not return for additional follow-up visits here. MRI of the abdomen at [facility] on 05/23/2016 showed unchanged pancreatic head mass as compared to 05/02/2016 CT with similar encasement of the SMA and abutment/encasement of the SMV. No evidence of metastatic disease in the upper abdomen. He completed a course of radiation delivered over 5 fractions on 06/18/2016 at [facility]. He saw Dr. Aleatha Borer on 08/15/2016 with plans for continued surveillance with next MRI scheduled 10/30/2016.  He feels well. No areas of consistent pain. He has good appetite. No nausea. No diarrhea.  Objective:  Vital signs in last 24 hours:  Blood pressure 140/80, pulse 74, temperature 99.2 F (37.3 C), temperature source Oral, resp. rate 18, height 6\' 3"  (1.905 m), weight 179 lb 8 oz (81.4 kg), SpO2 100 %.    HEENT: Neck without mass. Lymphatics: No palpable cervical or supraclavicular lymph nodes. Resp: Lungs clear bilaterally. Cardio: Regular rate and rhythm. GI: Abdomen soft and nontender. No hepatomegaly. No mass. Vascular: No leg edema. Port-A-Cath is without erythema.  Lab Results:  Lab Results  Component Value Date   WBC 3.5 (L) 04/24/2016   HGB 11.9 (L) 04/24/2016   HCT 36.3 (L) 04/24/2016   MCV 100.9 (H) 04/24/2016   PLT 131 (L) 04/24/2016   NEUTROABS 1.4 (L) 04/24/2016    Imaging:  No results found.  Medications: I have reviewed the patient's current medications.  Assessment/Plan: 1. Pancreas cancer-presenting with obstructive jaundice  CT 08/18/2015 with intra-and extrahepatic Dillard dilatation and dilatation of the pancreas duct, vague fullness at the head of the pancreas  EUS 10/05/2015 confirmed a pancreas head mass with no evidence of vascular invasion and no peripancreatic adenopathy (uT2,uN0)  EUS  biopsy of the pancreas head mass on 10/05/2015 confirmed adenocarcinoma  CT chest[facility]abdomen at [facility] 11/16/2015-hypoenhancing mass in the head and uncinate process of the pancreas with increased markedpancreatic ductal dilatation and greater than 180 encasement of the superior mesenteric artery and superior mesenteric vein above the level of the first tributary.  Cycle 1 FOLFOX 12/12/2015  Cycle 2 FOLFOX 12/27/2015  Cycle 3 FOLFOX 01/24/2016-Neulasta added  Cycle 4 FOLFOX 02/14/2016  Restaging CT scans 03/02/2016-decreasing size of pancreatic head/uncinate process mass with decreased encasement of the SMV (less than 180) and decreased pancreatic ductal dilatation. There remains greater than 180 of encasement of the SMA which is patent. Unchanged position of colon bile duct stent with decreasing right-sided pneumobilia. Stable 6 mm right upper lobe nodule.  Cycle 5 FOLFOX 03/13/2016 (oxaliplatin on hold due to poor tolerance)  Cycle 6 FOLFOX 03/27/2016 (oxaliplatin held)  Cycle 7 FOLFOX 04/10/2016 (oxaliplatin held)  Cycle 8 FOLFOX 04/24/2016 (oxaliplatin held)  MRI of the abdomen at [facility] on 05/23/2016 showed unchanged pancreatic head mass as compared to 05/02/2016 CT with similar encasement of the SMA and abutment/encasement of the SMV. No evidence of metastatic disease in the upper abdomen.   He completed a course of radiation delivered over 5 fractions on 06/18/2016 at [facility].  2. Obstructive jaundice secondary to #1-status postplacement of a bile duct stent  3. Hypertension  4. Anorexia/weight loss secondary to #1  5. 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 Bruun appears unchanged. He completed 8 cycles of FOLFOX chemotherapy,  last cycle given 04/24/2016. Restaging MRI of the abdomen on 05/23/2016 at [facility] was stable. He completed a course  of radiation at [facility] in June. There is no clinical evidence of disease progression. Per [facility] records reviewed through [facility] the plan is for a follow-up MRI and CA-19-9 in October.  Port-A-Cath was flushed in our office today. He will return for the next flush in 6 weeks. We will see him in follow-up in early November. He will contact the office in the interim with any problems.  Plan reviewed with Dr. Benay Spice.    Ned Card ANP/GNP-BC   08/30/2016  2:08 PM

## 2016-08-30 NOTE — Telephone Encounter (Signed)
Gave patient avs and calendars for appts.  °

## 2016-08-30 NOTE — Patient Instructions (Signed)
Implanted Port Home Guide An implanted port is a type of central line that is placed under the skin. Central lines are used to provide IV access when treatment or nutrition needs to be given through a person's veins. Implanted ports are used for long-term IV access. An implanted port may be placed because:  You need IV medicine that would be irritating to the small veins in your hands or arms.  You need long-term IV medicines, such as antibiotics.  You need IV nutrition for a long period.  You need frequent blood draws for lab tests.  You need dialysis.  Implanted ports are usually placed in the chest area, but they can also be placed in the upper arm, the abdomen, or the leg. An implanted port has two main parts:  Reservoir. The reservoir is round and will appear as a small, raised area under your skin. The reservoir is the part where a needle is inserted to give medicines or draw blood.  Catheter. The catheter is a thin, flexible tube that extends from the reservoir. The catheter is placed into a large vein. Medicine that is inserted into the reservoir goes into the catheter and then into the vein.  How will I care for my incision site? Do not get the incision site wet. Bathe or shower as directed by your health care provider. How is my port accessed? Special steps must be taken to access the port:  Before the port is accessed, a numbing cream can be placed on the skin. This helps numb the skin over the port site.  Your health care provider uses a sterile technique to access the port. ? Your health care provider must put on a mask and sterile gloves. ? The skin over your port is cleaned carefully with an antiseptic and allowed to dry. ? The port is gently pinched between sterile gloves, and a needle is inserted into the port.  Only "non-coring" port needles should be used to access the port. Once the port is accessed, a blood return should be checked. This helps ensure that the port  is in the vein and is not clogged.  If your port needs to remain accessed for a constant infusion, a clear (transparent) bandage will be placed over the needle site. The bandage and needle will need to be changed every week, or as directed by your health care provider.  Keep the bandage covering the needle clean and dry. Do not get it wet. Follow your health care provider's instructions on how to take a shower or bath while the port is accessed.  If your port does not need to stay accessed, no bandage is needed over the port.  What is flushing? Flushing helps keep the port from getting clogged. Follow your health care provider's instructions on how and when to flush the port. Ports are usually flushed with saline solution or a medicine called heparin. The need for flushing will depend on how the port is used.  If the port is used for intermittent medicines or blood draws, the port will need to be flushed: ? After medicines have been given. ? After blood has been drawn. ? As part of routine maintenance.  If a constant infusion is running, the port may not need to be flushed.  How long will my port stay implanted? The port can stay in for as long as your health care provider thinks it is needed. When it is time for the port to come out, surgery will be   done to remove it. The procedure is similar to the one performed when the port was put in. When should I seek immediate medical care? When you have an implanted port, you should seek immediate medical care if:  You notice a bad smell coming from the incision site.  You have swelling, redness, or drainage at the incision site.  You have more swelling or pain at the port site or the surrounding area.  You have a fever that is not controlled with medicine.  This information is not intended to replace advice given to you by your health care provider. Make sure you discuss any questions you have with your health care provider. Document  Released: 12/18/2004 Document Revised: 05/26/2015 Document Reviewed: 08/25/2012 Elsevier Interactive Patient Education  2017 [employer]  

## 2016-10-17 ENCOUNTER — Ambulatory Visit (HOSPITAL_BASED_OUTPATIENT_CLINIC_OR_DEPARTMENT_OTHER): Payer: Medicare Other

## 2016-10-17 DIAGNOSIS — Z452 Encounter for adjustment and management of vascular access device: Secondary | ICD-10-CM | POA: Diagnosis not present

## 2016-10-17 DIAGNOSIS — C250 Malignant neoplasm of head of pancreas: Secondary | ICD-10-CM | POA: Diagnosis not present

## 2016-10-17 DIAGNOSIS — Z95828 Presence of other vascular implants and grafts: Secondary | ICD-10-CM

## 2016-10-17 MED ORDER — HEPARIN SOD (PORK) LOCK FLUSH 100 UNIT/ML IV SOLN
500.0000 [IU] | Freq: Once | INTRAVENOUS | Status: AC | PRN
  Administered 2016-10-17: 500 [IU] via INTRAVENOUS
  Filled 2016-10-17: qty 5

## 2016-10-17 MED ORDER — SODIUM CHLORIDE 0.9% FLUSH
10.0000 mL | INTRAVENOUS | Status: DC | PRN
  Administered 2016-10-17: 10 mL via INTRAVENOUS
  Filled 2016-10-17: qty 10

## 2016-11-01 ENCOUNTER — Ambulatory Visit (HOSPITAL_BASED_OUTPATIENT_CLINIC_OR_DEPARTMENT_OTHER): Payer: Medicare Other | Admitting: Oncology

## 2016-11-01 ENCOUNTER — Telehealth: Payer: Self-pay | Admitting: Oncology

## 2016-11-01 VITALS — BP 130/61 | HR 76 | Temp 97.1°F | Resp 18 | Ht 75.0 in | Wt 177.9 lb

## 2016-11-01 DIAGNOSIS — I10 Essential (primary) hypertension: Secondary | ICD-10-CM | POA: Diagnosis not present

## 2016-11-01 DIAGNOSIS — C250 Malignant neoplasm of head of pancreas: Secondary | ICD-10-CM

## 2016-11-01 DIAGNOSIS — Z8507 Personal history of malignant neoplasm of pancreas: Secondary | ICD-10-CM | POA: Diagnosis not present

## 2016-11-01 NOTE — Telephone Encounter (Signed)
Scheduled appt per 1/11 los - Gave patient AVS and calender per los.  

## 2016-11-01 NOTE — Progress Notes (Signed)
[place] OFFICE PROGRESS NOTE   Diagnosis: Pancreas cancer  INTERVAL HISTORY:   Mr. Amis returns as scheduled.  He reports a good appetite.  He continues to have right hip pain.  He reports urinary frequency.  He is followed by urology.  A restaging MRI of the abdomen at [facility] on 10/30/2016 revealed a slight increase in the size of the pancreas mass compared to 05/23/2016.  No other evidence of disease progression.   Objective:  Vital signs in last 24 hours:  Blood pressure 130/61, pulse 76, temperature (!) 97.1 F (36.2 C), temperature source Oral, resp. rate 18, height 6\' 3"  (1.905 m), weight 177 lb 14.4 oz (80.7 kg), SpO2 100 %.   Resp: Clear bilaterally Cardio: Regular rate and rhythm GI: Nontender, no mass, no hepatomegaly Vascular: Edema  Portacath/PICC-without erythema  Lab Results:  Lab Results  Component Value Date   WBC 3.5 (L) 04/24/2016   HGB 11.9 (L) 04/24/2016   HCT 36.3 (L) 04/24/2016   MCV 100.9 (H) 04/24/2016   PLT 131 (L) 04/24/2016   NEUTROABS 1.4 (L) 04/24/2016    CMP     Component Value Date/Time   NA 142 04/24/2016 1142   K 3.9 04/24/2016 1142   CL 105 12/08/2015 0750   CO2 26 04/24/2016 1142   GLUCOSE 91 04/24/2016 1142   BUN 9.8 04/24/2016 1142   CREATININE 1.0 04/24/2016 1142   CALCIUM 9.3 04/24/2016 1142   PROT 6.4 04/24/2016 1142   ALBUMIN 3.0 (L) 04/24/2016 1142   AST 17 04/24/2016 1142   ALT 7 04/24/2016 1142   ALKPHOS 117 04/24/2016 1142   BILITOT 1.31 (H) 04/24/2016 1142   GFRNONAA >60 12/08/2015 0750   GFRAA >60 12/08/2015 0750    CA 19-9 on 10/30/2016: 113  Medications: I have reviewed the patient's current medications.  Assessment/Plan: 1. Pancreas cancer-presenting with obstructive jaundice  CT 08/18/2015 with intra-and extrahepatic Dillard dilatation and dilatation of the pancreas duct, vague fullness at the head of the pancreas  EUS 10/05/2015 confirmed a pancreas head mass with no evidence of  vascular invasion and no peripancreatic adenopathy (uT2,uN0)  EUS biopsy of the pancreas head mass on 10/05/2015 confirmed adenocarcinoma  CT chest/abdomen at [facility] 11/16/2015-hypoenhancing mass in the head and uncinate process of the pancreas with increased markedpancreatic ductal dilatation and greater than 180 encasement of the superior mesenteric artery and superior mesenteric vein above the level of the first tributary.  Cycle 1 FOLFOX 12/12/2015  Cycle 2 FOLFOX 12/27/2015  Cycle 3 FOLFOX 01/24/2016-Neulasta added  Cycle 4 FOLFOX 02/14/2016  Restaging CT scans 03/02/2016-decreasing size of pancreatic head/uncinate process mass with decreased encasement of the SMV (less than 180) and decreased pancreatic ductal dilatation. There remains greater than 180 of encasement of the SMA which is patent. Unchanged position of colon bile duct stent with decreasing right-sided pneumobilia. Stable 6 mm right upper lobe nodule.  Cycle 5 FOLFOX 03/13/2016 (oxaliplatin on hold due to poor tolerance)  Cycle 6 FOLFOX 03/27/2016 (oxaliplatin held)  Cycle 7 FOLFOX 04/10/2016 (oxaliplatin held)  Cycle 8 FOLFOX 04/24/2016 (oxaliplatin held)  MRI of the abdomen at [facility] on 05/23/2016 showed unchanged pancreatic head mass as compared to 05/02/2016 CT with similar encasement of the SMA and abutment/encasement of the SMV. No evidence of metastatic disease in the upper abdomen.   He completed a course of radiation delivered over 5 fractions on 06/18/2016 at [facility].  MRI abdomen at [facility] 10/30/2016- valve increase in size of the pancreas head mass with  encasement of the SMA and SMV, no evidence of metastatic disease  2. Obstructive jaundice secondary to #1-status postplacement of a bile duct stent  3. Hypertension  4. Anorexia/weight loss secondary to #1  5. 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  No clinical evidence of disease progression.  He is scheduled to see Dr. Sigurd Sos in January.  He will return for a Port-A-Cath flush in 6 weeks and 12 weeks.  He will be scheduled for an office visit in 12 weeks.  15 minutes were spent with the patient today.  The majority of the time was used for counseling and coordination of care.  Donneta Romberg, MD  11/01/2016  11:48 AM

## 2016-11-15 ENCOUNTER — Encounter (HOSPITAL_COMMUNITY): Payer: Self-pay | Admitting: *Deleted

## 2016-11-15 ENCOUNTER — Emergency Department (HOSPITAL_COMMUNITY)
Admission: EM | Admit: 2016-11-15 | Discharge: 2016-11-15 | Disposition: A | Discharge status: Home / Self Care | Payer: Medicare Other | Attending: Emergency Medicine | Admitting: Emergency Medicine

## 2016-11-15 ENCOUNTER — Other Ambulatory Visit: Payer: Self-pay

## 2016-11-15 DIAGNOSIS — R339 Retention of urine, unspecified: Secondary | ICD-10-CM | POA: Diagnosis present

## 2016-11-15 DIAGNOSIS — Z7982 Long term (current) use of aspirin: Secondary | ICD-10-CM | POA: Insufficient documentation

## 2016-11-15 DIAGNOSIS — I10 Essential (primary) hypertension: Secondary | ICD-10-CM | POA: Diagnosis not present

## 2016-11-15 DIAGNOSIS — R338 Other retention of urine: Secondary | ICD-10-CM | POA: Diagnosis not present

## 2016-11-15 DIAGNOSIS — Z79899 Other long term (current) drug therapy: Secondary | ICD-10-CM | POA: Diagnosis not present

## 2016-11-15 MED ORDER — LIDOCAINE HCL 2 % EX GEL
1.0000 "application " | Freq: Once | CUTANEOUS | Status: DC
  Filled 2016-11-15: qty 11

## 2016-11-15 MED ORDER — LIDOCAINE HCL 2 % EX GEL
1.0000 "application " | Freq: Once | CUTANEOUS | Status: AC
  Administered 2016-11-15: 1 via URETHRAL

## 2016-11-15 NOTE — ED Provider Notes (Signed)
[facility] DEPT Provider Note   CSN: [id number] Arrival date & time: 11/15/16  2200     History   Chief Complaint Chief Complaint  Patient presents with  . Urinary Retention    HPI Dustin Lucas is a 76 y.o. male.  HPI The patient with history of prostate cancer, pancreatic mass comes in with chief complaint of urinary retention.  Patient had an episode of urinary retention recently and was discharged from the ER with a Foley catheter.  Patient went to the urologist earlier today and had his Foley catheter removed.  Since then patient has not passed a single drop of urine.  Patient has no abdominal discomfort at this time, but he did not want to wait until he starts having pain.   Past Medical History:  Diagnosis Date  . Arthritis   . Cancer ([place]) 7-8 yrs ago   prostate cancer, monitoring psa levels q 6 months  . Hypertension   . PONV (postoperative nausea and vomiting)    in past , none recent  . Urinary frequency     Patient Active Problem List   Diagnosis Date Noted  . Port catheter in place 12/12/2015  . 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 Essential hypertension, benign 08/21/2015  . Hypokalemia 08/21/2015  . Cholelithiasis without cholecystitis   . Jaundice 08/18/2015  . Abnormal LFTs 08/18/2015    Past Surgical History:  Procedure Laterality Date  . ERCP N/A 08/21/2015   Procedure: ENDOSCOPIC RETROGRADE CHOLANGIOPANCREATOGRAPHY (ERCP);  Surgeon: Clarene Essex, MD;  Location: [facility];  Service: Endoscopy;  Laterality: N/A;  . ERCP N/A 08/20/2015   Procedure: ENDOSCOPIC RETROGRADE CHOLANGIOPANCREATOGRAPHY (ERCP);  Surgeon: Carol Ada, MD;  Location: [facility];  Service: Endoscopy;  Laterality: N/A;  . ERCP N/A 10/05/2015   Procedure: ENDOSCOPIC RETROGRADE CHOLANGIOPANCREATOGRAPHY (ERCP);  Surgeon: Arta Silence, MD;  Location: [facility];  Service: Endoscopy;  Laterality: N/A;  Dr. Watt Climes to do ERCP  .  ESOPHAGOGASTRODUODENOSCOPY (EGD) WITH PROPOFOL N/A 08/29/2015   Procedure: ESOPHAGOGASTRODUODENOSCOPY (EGD) WITH PROPOFOL;  Surgeon: Arta Silence, MD;  Location: [facility];  Service: Endoscopy;  Laterality: N/A;  . EUS N/A 10/05/2015   Procedure: UPPER ENDOSCOPIC ULTRASOUND (EUS) RADIAL;  Surgeon: Arta Silence, MD;  Location: [facility];  Service: Endoscopy;  Laterality: N/A;  . FRACTURE SURGERY Left    plate in ankle  . IR GENERIC HISTORICAL  12/08/2015   IR US GUIDE VASC ACCESS RIGHT 12/08/2015 Corrie Mckusick, DO [facility]  . IR GENERIC HISTORICAL  12/08/2015   IR FLUORO GUIDE PORT INSERTION RIGHT 12/08/2015 Corrie Mckusick, DO [facility]  . JOINT REPLACEMENT     hip replacement  right  . TONSILLECTOMY    . TOTAL HIP REVISION Right 08/09/2014   Procedure: TOTAL HIP REVISION;  Surgeon: Frederik Pear, MD;  Location: [facility];  Service: Orthopedics;  Laterality: Right;  REVISION (ASR) RIGHT TOTAL HIP ARHROPLASTY**INNOMET OSTEOTONES  . UPPER ESOPHAGEAL ENDOSCOPIC ULTRASOUND (EUS) N/A 08/29/2015   Procedure: UPPER ESOPHAGEAL ENDOSCOPIC ULTRASOUND (EUS);  Surgeon: Arta Silence, MD;  Location: [facility];  Service: Endoscopy;  Laterality: N/A;       Home Medications    Prior to Admission medications   Medication Sig Start Date End Date Taking? Authorizing Provider  amLODipine-valsartan (EXFORGE) 5-320 MG per tablet Take 1 tablet by mouth daily. 07/20/14  Yes [provider]  aspirin 81 MG chewable tablet Chew 1 tablet (81 mg total) by mouth daily. Start taking 08/29/2015 08/29/15  Yes  Theodis Blaze, MD  cephALEXin [facility]) 500 MG capsule Take 500 mg 2 (two) times daily by mouth. 11/12/16  Yes [provider]  Cholecalciferol (VITAMIN D-3) 1000 UNITS CAPS Take 2,000 Units by mouth daily.    Yes [provider]  dutasteride (AVODART) 0.5 MG capsule Take 0.5 mg by mouth daily. 07/12/14  Yes [provider]  loperamide (IMODIUM A-D) 2 MG tablet Take 2 mg by  mouth 4 (four) times daily as needed for diarrhea or loose stools.   Yes [provider]  potassium chloride SA (K-DUR,KLOR-CON) 20 MEQ tablet Take 20 mEq by mouth daily.   Yes [provider]  tamsulosin (FLOMAX) 0.4 MG CAPS capsule Take 0.4 mg daily by mouth. 11/08/16  Yes [provider]  acetaminophen (TYLENOL) 325 MG tablet Take 2 tablets (650 mg total) by mouth every 6 (six) hours as needed for mild pain (or Fever >/= 101). Patient not taking: Reported on 02/14/2016 08/22/15   Theodis Blaze, MD  folic acid (FOLVITE) 1 MG tablet Take 1 mg by mouth daily.    [provider]  lidocaine-prilocaine (EMLA) cream Apply to portacath site 1 hour prior to use Patient not taking: Reported on 08/30/2016 11/29/15   Owens Shark, NP  prochlorperazine (COMPAZINE) 10 MG tablet Take 1 tablet (10 mg total) by mouth every 6 (six) hours as needed for nausea or vomiting. Patient not taking: Reported on 02/14/2016 11/29/15   Owens Shark, NP    Family History No family history on file.  Social History Social History   Tobacco Use  . Smoking status: Never Smoker  . Smokeless tobacco: Never Used  Substance Use Topics  . Alcohol use: No  . Drug use: No     Allergies   Shellfish allergy   Review of Systems Review of Systems  Constitutional: Negative for activity change and fever.  Gastrointestinal: Negative for abdominal pain.     Physical Exam Updated Vital Signs BP 110/72 (BP Location: Right Arm)   Pulse 78   Temp 98.7 F (37.1 C) (Oral)   Resp 20   Ht 6\' 3"  (1.905 m)   Wt 80.3 kg (177 lb)   SpO2 100%   BMI 22.12 kg/m   Physical Exam  Constitutional: He is oriented to person, place, and time. He appears well-developed.  HENT:  Head: Atraumatic.  Neck: Neck supple.  Cardiovascular: Normal rate.  Pulmonary/Chest: Effort normal.  Neurological: He is alert and oriented to person, place, and time.  Skin: Skin is warm.  Nursing note and vitals  reviewed.    ED Treatments / Results  Labs (all labs ordered are listed, but only abnormal results are displayed) Labs Reviewed - No data to display  EKG  EKG Interpretation None       Radiology No results found.  Procedures Procedures (including critical care time)  Medications Ordered in ED Medications  lidocaine (XYLOCAINE) 2 % jelly 1 application (1 application Urethral Given 11/15/16 2307)     Initial Impression / Assessment and Plan / ED Course  I have reviewed the triage vital signs and the nursing notes.  Pertinent labs & imaging results that were available during my care of the patient were reviewed by me and considered in my medical decision making (see chart for details).     Patient comes in with chief complaint of urinary retention. No new medications.  Patient has a of history of prostate cancer and pancreatic mass, so it is possible the  patient has mechanical lesion leading to obstructive uropathy. Bladder scan revealed patient had 400 cc of urine.  Foley catheter placed, and 325 mL's of urine output was measured immediately. Patient will be discharged.  He has been asked to see urology again for optimal workup.  Final Clinical Impressions(s) / ED Diagnoses   Final diagnoses:  Acute urinary retention    ED Discharge Orders    None       Varney Biles, MD 11/15/16 2348

## 2016-11-15 NOTE — Discharge Instructions (Signed)
Please see the urologist as requested to further determine the cause of the urinary retention.

## 2016-11-15 NOTE — ED Triage Notes (Signed)
Pt had foley cath removed this am at Urology and has not been able to void since. 441 ml with bladder scanner.

## 2016-12-05 ENCOUNTER — Other Ambulatory Visit: Payer: Self-pay | Admitting: Urology

## 2016-12-12 ENCOUNTER — Inpatient Hospital Stay (HOSPITAL_COMMUNITY): Admission: RE | Admit: 2016-12-12 | Payer: Medicare Other | Source: Ambulatory Visit

## 2016-12-13 NOTE — Patient Instructions (Signed)
Jakobee Brackins  12/13/2016   Your procedure is scheduled on: 12-18-16  Report to [facility] Main  Entrance Take [place]  elevators to 3rd floor to  [place] at [facility].    Call this number if you have problems the morning of surgery [phone]   Remember: ONLY 1 PERSON MAY GO WITH YOU TO SHORT STAY TO GET  READY MORNING OF YOUR SURGERY.  Do not eat food or drink liquids :After Midnight.     Take these medicines the morning of surgery with A SIP OF WATER: none                                You may not have any metal on your body including hair pins and              piercings  Do not wear jewelry, make-up, lotions, powders or perfumes, deodorant                         Men may shave face and neck.   Do not bring valuables to the hospital. [place].  Contacts, dentures or bridgework may not be worn into surgery.  Leave suitcase in the car. After surgery it may be brought to your room.                 Please read over the following fact sheets you were given: _____________________________________________________________________             [facility] - Preparing for Surgery Before surgery, you can play an important role.  Because skin is not sterile, your skin needs to be as free of germs as possible.  You can reduce the number of germs on your skin by washing with CHG (chlorahexidine gluconate) soap before surgery.  CHG is an antiseptic cleaner which kills germs and bonds with the skin to continue killing germs even after washing. Please DO NOT use if you have an allergy to CHG or antibacterial soaps.  If your skin becomes reddened/irritated stop using the CHG and inform your nurse when you arrive at Short Stay. Do not shave (including legs and underarms) for at least 48 hours prior to the first CHG shower.  You may shave your face/neck. Please follow these instructions carefully:  1.  Shower with CHG  Soap the night before surgery and the  morning of Surgery.  2.  If you choose to wash your hair, wash your hair first as usual with your  normal  shampoo.  3.  After you shampoo, rinse your hair and body thoroughly to remove the  shampoo.                           4.  Use CHG as you would any other liquid soap.  You can apply chg directly  to the skin and wash                       Gently with a scrungie or clean washcloth.  5.  Apply the CHG Soap to your body ONLY FROM THE NECK DOWN.   Do not use on face/ open  Wound or open sores. Avoid contact with eyes, ears mouth and genitals (private parts).                       Wash face,  Genitals (private parts) with your normal soap.             6.  Wash thoroughly, paying special attention to the area where your surgery  will be performed.  7.  Thoroughly rinse your body with warm water from the neck down.  8.  DO NOT shower/wash with your normal soap after using and rinsing off  the CHG Soap.                9.  Pat yourself dry with a clean towel.            10.  Wear clean pajamas.            11.  Place clean sheets on your bed the night of your first shower and do not  sleep with pets. Day of Surgery : Do not apply any lotions/deodorants the morning of surgery.  Please wear clean clothes to the hospital/surgery center.  FAILURE TO FOLLOW THESE INSTRUCTIONS MAY RESULT IN THE CANCELLATION OF YOUR SURGERY PATIENT SIGNATURE_________________________________  NURSE SIGNATURE__________________________________  ________________________________________________________________________

## 2016-12-13 NOTE — Progress Notes (Signed)
Ct chest 03-02-16 epic care everywhere

## 2016-12-14 ENCOUNTER — Encounter (HOSPITAL_COMMUNITY)
Admission: RE | Admit: 2016-12-14 | Discharge: 2016-12-14 | Disposition: A | Discharge status: Home / Self Care | Payer: Medicare Other | Source: Ambulatory Visit | Attending: Urology | Admitting: Urology

## 2016-12-14 ENCOUNTER — Other Ambulatory Visit: Payer: Self-pay

## 2016-12-14 ENCOUNTER — Ambulatory Visit (HOSPITAL_BASED_OUTPATIENT_CLINIC_OR_DEPARTMENT_OTHER): Payer: Medicare Other

## 2016-12-14 ENCOUNTER — Encounter (HOSPITAL_COMMUNITY): Payer: Self-pay

## 2016-12-14 VITALS — BP 145/76 | HR 86 | Temp 98.7°F

## 2016-12-14 DIAGNOSIS — Z01812 Encounter for preprocedural laboratory examination: Secondary | ICD-10-CM | POA: Diagnosis present

## 2016-12-14 DIAGNOSIS — I10 Essential (primary) hypertension: Secondary | ICD-10-CM | POA: Diagnosis not present

## 2016-12-14 DIAGNOSIS — Z01810 Encounter for preprocedural cardiovascular examination: Secondary | ICD-10-CM | POA: Diagnosis present

## 2016-12-14 DIAGNOSIS — Z8507 Personal history of malignant neoplasm of pancreas: Secondary | ICD-10-CM | POA: Diagnosis not present

## 2016-12-14 DIAGNOSIS — Z8546 Personal history of malignant neoplasm of prostate: Secondary | ICD-10-CM | POA: Insufficient documentation

## 2016-12-14 DIAGNOSIS — Z95828 Presence of other vascular implants and grafts: Secondary | ICD-10-CM

## 2016-12-14 DIAGNOSIS — I444 Left anterior fascicular block: Secondary | ICD-10-CM | POA: Diagnosis not present

## 2016-12-14 DIAGNOSIS — E876 Hypokalemia: Secondary | ICD-10-CM | POA: Diagnosis not present

## 2016-12-14 DIAGNOSIS — C250 Malignant neoplasm of head of pancreas: Secondary | ICD-10-CM

## 2016-12-14 DIAGNOSIS — Z452 Encounter for adjustment and management of vascular access device: Secondary | ICD-10-CM

## 2016-12-14 HISTORY — DX: Personal history of irradiation: Z92.3

## 2016-12-14 HISTORY — DX: Unspecified asthma, uncomplicated: J45.909

## 2016-12-14 LAB — COMPREHENSIVE METABOLIC PANEL
ALT: 6 U/L (ref 0–55)
AST: 15 U/L (ref 5–34)
Albumin: 3.3 g/dL — ABNORMAL LOW (ref 3.5–5.0)
Alkaline Phosphatase: 100 U/L (ref 40–150)
Anion Gap: 10 mEq/L (ref 3–11)
BUN: 10.3 mg/dL (ref 7.0–26.0)
CHLORIDE: 109 meq/L (ref 98–109)
CO2: 23 meq/L (ref 22–29)
CREATININE: 1 mg/dL (ref 0.7–1.3)
Calcium: 9.1 mg/dL (ref 8.4–10.4)
EGFR: >60 mL/min/{1.73_m2} (ref >60)
Glucose: 96 mg/dl (ref 70–140)
POTASSIUM: 3.6 meq/L (ref 3.5–5.1)
SODIUM: 142 meq/L (ref 136–145)
Total Bilirubin: 0.78 mg/dL (ref 0.20–1.20)
Total Protein: 6.8 g/dL (ref 6.4–8.3)

## 2016-12-14 LAB — CBC WITH DIFFERENTIAL/PLATELET
BASO%: 0 % (ref 0.0–2.0)
Basophils Absolute: 0 10*3/uL (ref 0.0–0.1)
EOS%: 1 % (ref 0.0–7.0)
Eosinophils Absolute: 0 10*3/uL (ref 0.0–0.5)
HEMATOCRIT: 38.9 % (ref 38.4–49.9)
HGB: 13 g/dL (ref 13.0–17.1)
LYMPH#: 1 10*3/uL (ref 0.9–3.3)
LYMPH%: 24.7 % (ref 14.0–49.0)
MCH: 31.9 pg (ref 27.2–33.4)
MCHC: 33.4 g/dL (ref 32.0–36.0)
MCV: 95.6 fL (ref 79.3–98.0)
MONO#: 0.4 10*3/uL (ref 0.1–0.9)
MONO%: 9.3 % (ref 0.0–14.0)
NEUT#: 2.6 10*3/uL (ref 1.5–6.5)
NEUT%: 65 % (ref 39.0–75.0)
Platelets: 159 10*3/uL (ref 140–400)
RBC: 4.07 10*6/uL — AB (ref 4.20–5.82)
RDW: 14.8 % — ABNORMAL HIGH (ref 11.0–14.6)
WBC: 4 10*3/uL (ref 4.0–10.3)

## 2016-12-14 MED ORDER — SODIUM CHLORIDE 0.9% FLUSH
10.0000 mL | INTRAVENOUS | Status: DC | PRN
  Administered 2016-12-14: 10 mL via INTRAVENOUS
  Filled 2016-12-14: qty 10

## 2016-12-14 MED ORDER — HEPARIN SOD (PORK) LOCK FLUSH 100 UNIT/ML IV SOLN
500.0000 [IU] | Freq: Once | INTRAVENOUS | Status: AC | PRN
  Administered 2016-12-14: 500 [IU] via INTRAVENOUS
  Filled 2016-12-14: qty 5

## 2016-12-15 LAB — CANCER ANTIGEN 19-9: CA 19-9: 85 U/mL — ABNORMAL HIGH (ref 0–35)

## 2016-12-16 LAB — URINE CULTURE

## 2016-12-17 NOTE — H&P (Signed)
CC: I have urinary retention.  HPI: Dustin Lucas is a 76 year-old male established patient who is here for urinary retention.  His current symptoms did not begin after he had a surgical procedure. He currently has an indwelling catheter. His urinary retention is being treated with foley catheter, flomax, and avodart.   12/05/16. Dustin Lucas returns today for cystoscopy for his history of retention with failed voiding trials despite avodart and tamsulosin for many years.   11/26/16: Dustin Lucas returns today in f/u. He has a history of retention and last had a voiding trial on 11/15/16. He was unable to void and had the foley replaced. He remains on avodart and tamsulosin.   11/08: The patient has not been able to follow-up due to unfortunate diagnosis of pancreatic cancer treated with radiation and chemotherapy coordinated through one of the oncologists here in [place] via [facility] per the patient. The patient stated the tumor noted was involved with an artery so resection was ill-advised. He currently states he is in remission however.   C/o frequency, urgency, weak stream, prolonged voiding, hesitancy, and straining worsening over the last several months. He'll sometimes have bladder and urethral pain with voiding. What prompted this visit is the progressively worsening nature of the above symptoms for the past few weeks. He denies fevers or hematuria.    11/15: Catheter placed at last office visit. I treated him with Keflex as well and began the patient on tamsulosin. He follows up today for voiding trial.     ALLERGIES: No Allergies    MEDICATIONS: Avodart  Cipro 250 mg tablet 1 tablet PO BID  Tamsulosin Hcl 0.4 mg capsule, ext release 24 hr 1 capsule PO Daily  Aspirin 81 MG TABS Oral  Exforge 5 mg-320 mg tablet Oral  Potassium 99 mg tablet  Vitamin D-3 CAPS Oral     GU PSH: No GU PSH      PSH Notes: Total Hip Replacement, Tonsillectomy   NON-GU PSH: Remove Tonsils - 2009 Total  Hip Replacement - 2017    GU PMH: Urinary Retention - 11/26/2016, - 11/15/2016, - 11/08/2016 Weak Urinary Stream - 11/08/2016 Prostate Cancer (Stable) - 07/27/2015, Prostate cancer, - 2017 Acute Cystitis/UTI, Acute cystitis without hematuria - 2017 BPH w/LUTS, Benign prostatic hyperplasia with urinary obstruction - 2017 Urge incontinence, Urge incontinence of urine - 2017 Urinary Frequency, Increased urinary frequency - 2016 Elevated PSA, Elevated prostate specific antigen (PSA) - 2015 ED due to arterial insufficiency, Erectile dysfunction due to arterial insufficiency - 2014 Gross hematuria, Gross Hematuria - 2014, Gross Hematuria, - 2014 Primary hypogonadism, 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, Testicular atrophy - 2014 Urinary Tract Inf, Unspec site, Pyuria - 2014    NON-GU PMH: Bacteriuria, He has dark urine with bacteria - 07/27/2015 Encounter for general adult medical examination without abnormal findings, Encounter for preventive health examination - 2017 Asthma, Asthma - 2014 Personal history of other diseases of the circulatory system, History of mitral valve prolapse - 2014, History of hypertension, - 2014 Personal history of other diseases of the nervous system and sense organs, History of sleep apnea - 2014 Malignant neoplasm of pancreas, unspecified    FAMILY HISTORY: Death In The Family Father - Runs In Family Death In The Family Mother - Runs In Family Family Health Status Number - Runs In Family No pertinent family history - Other   SOCIAL HISTORY: Marital Status: Married Preferred Language: English; Race: Black or African American Current Smoking Status: Patient has never smoked.  Has never  drank.  Does not drink caffeine.     Notes: Never A Smoker, Marital History - Currently Married, Alcohol Use, Occupation:, Tobacco Use, Caffeine Use   REVIEW OF SYSTEMS:    GU Review Male:   Patient denies hard to postpone urination, erection problems, stream starts and  stops, get up at night to urinate, trouble starting your stream, penile pain, burning/ pain with urination, leakage of urine, have to strain to urinate , and frequent urination.  Gastrointestinal (Upper):   Patient denies nausea, vomiting, and indigestion/ heartburn.  Gastrointestinal (Lower):   Patient denies diarrhea and constipation.  Constitutional:   Patient denies fever, night sweats, weight loss, and fatigue.  Skin:   Patient denies skin rash/ lesion and itching.  Eyes:   Patient denies blurred vision and double vision.  Ears/ Nose/ Throat:   Patient denies sore throat and sinus problems.  Hematologic/Lymphatic:   Patient denies swollen glands and easy bruising.  Cardiovascular:   Patient denies leg swelling and chest pains.  Respiratory:   Patient denies cough and shortness of breath.  Endocrine:   Patient denies excessive thirst.  Musculoskeletal:   Unable to walk. Patient denies back pain and joint pain.  Neurological:   Patient denies headaches and dizziness.  Psychologic:   Patient denies depression and anxiety.   VITAL SIGNS: None   MULTI-SYSTEM PHYSICAL EXAMINATION:    Constitutional: Well-nourished. No physical deformities. Normally developed. Good grooming.  Respiratory: No labored breathing, no use of accessory muscles. CTA  Cardiovascular: Normal temperature, RRR without Murmur.      PAST DATA REVIEWED:  Source Of History:  Patient   11/26/16 07/20/15 01/25/15 06/16/14 03/26/14 12/11/13 06/03/13 12/01/12  PSA  Total PSA 3.19 ng/mL 2.51  3.18  3.11  2.91  3.10  2.74  2.95     06/02/12 01/27/08 05/15/04 04/13/03  Hormones  Testosterone, Total 418  266.0  3.54  2.47     PROCEDURES:         Flexible Cystoscopy - 52000  Risks, benefits, and some of the potential complications of the procedure were discussed.    Meatus:  Normal size. Normal location. Normal condition.  Urethra:  No strictures.  External Sphincter:  Normal.  Verumontanum:  Normal.  Prostate:   Obstructing. Moderate hyperplasia.  Bladder Neck:  Non-obstructing.  Ureteral Orifices:  Normal location. Normal size. Normal shape. Effluxed clear urine.  Bladder:  Severe trabeculation with cellules and small divertuli. Foley irritation on trigone and posterior wall. No tumors. No stones.      The procedure was well tolerated and there were no complications.         Catheter / SP Tube - [id number] Simple Foley Catheterization  A 16 French Foley catheter was inserted into the bladder using sterile technique. The patient was taught routine catheter care. A leg bag was connected. 240 cc of urine was obtained.   ASSESSMENT:      ICD-10 Details  1 GU:   BPH w/LUTS - N40.1 Dustin Lucas failed his voiding trial today. I am going to get him set up for a TURP. I reviewd the risks of a TURP including bleeding, infection, incontinence, stricture, need for secondary procedures, ejaculatory and erectile dysfunction, thrombotic events, fluid overload and anesthetic complications. I explained that 95% of men will have relief of the obstructive symptoms and about 70% will have relief of the irritative symptoms.   2   Urinary Retention - R33.8    PLAN:  Schedule Return Visit/Planned Activity: Next Available Appointment - Schedule Surgery

## 2016-12-17 NOTE — Progress Notes (Addendum)
PT CASE MOVED FROM MAIN [facility] OR TO [facility].  SPOKE W/ PT VIA PHONE FOR UPDATED INSTRUCTIONS FOR DOS FOR [facility].  PT VERBALIZED UNDERSTANDING TO ARRIVE AT 0845 BE NPO AFTER MN WITH EXCEPTION SIPS OF WATER WITH NORVAC.  REVIEW [facility] GUIDELINES , WILL BRING MEDICATIONS.  VERBALIZED UNDERSTANDING DIRECTIONS TO [place].  PT COMPLETED PAT VISIT 12-14-2016 WITH NURSE.  CURRENT LAB RESULTS AND EKG IN CHART AND Epic.

## 2016-12-18 ENCOUNTER — Ambulatory Visit (HOSPITAL_COMMUNITY): Payer: Medicare Other | Admitting: Anesthesiology

## 2016-12-18 ENCOUNTER — Encounter (HOSPITAL_BASED_OUTPATIENT_CLINIC_OR_DEPARTMENT_OTHER)
Admission: RE | Disposition: A | Discharge status: Home / Self Care | Payer: Self-pay | Source: Ambulatory Visit | Attending: Urology

## 2016-12-18 ENCOUNTER — Observation Stay (HOSPITAL_BASED_OUTPATIENT_CLINIC_OR_DEPARTMENT_OTHER)
Admission: RE | Admit: 2016-12-18 | Discharge: 2016-12-19 | Disposition: A | Discharge status: Home / Self Care | Payer: Medicare Other | Source: Ambulatory Visit | Attending: Urology | Admitting: Urology

## 2016-12-18 ENCOUNTER — Encounter (HOSPITAL_BASED_OUTPATIENT_CLINIC_OR_DEPARTMENT_OTHER): Payer: Self-pay

## 2016-12-18 DIAGNOSIS — Z79899 Other long term (current) drug therapy: Secondary | ICD-10-CM | POA: Insufficient documentation

## 2016-12-18 DIAGNOSIS — Z7982 Long term (current) use of aspirin: Secondary | ICD-10-CM | POA: Diagnosis not present

## 2016-12-18 DIAGNOSIS — I10 Essential (primary) hypertension: Secondary | ICD-10-CM | POA: Insufficient documentation

## 2016-12-18 DIAGNOSIS — N401 Enlarged prostate with lower urinary tract symptoms: Secondary | ICD-10-CM | POA: Diagnosis present

## 2016-12-18 DIAGNOSIS — R338 Other retention of urine: Secondary | ICD-10-CM | POA: Insufficient documentation

## 2016-12-18 DIAGNOSIS — N138 Other obstructive and reflux uropathy: Secondary | ICD-10-CM | POA: Insufficient documentation

## 2016-12-18 HISTORY — PX: TRANSURETHRAL RESECTION OF PROSTATE: SHX73

## 2016-12-18 SURGERY — TURP (TRANSURETHRAL RESECTION OF PROSTATE)
Anesthesia: General | Site: Bladder

## 2016-12-18 MED ORDER — AMLODIPINE BESYLATE-VALSARTAN 5-320 MG PO TABS: 1.0000 | ORAL_TABLET | Freq: Every day | ORAL | Status: DC

## 2016-12-18 MED ORDER — ASPIRIN 81 MG PO CHEW
81.0000 mg | CHEWABLE_TABLET | Freq: Every day | ORAL | Status: DC
  Administered 2016-12-18: 81 mg via ORAL
  Filled 2016-12-18: qty 1

## 2016-12-18 MED ORDER — PROPOFOL 10 MG/ML IV BOLUS
INTRAVENOUS | Status: DC | PRN
  Administered 2016-12-18: 140 mg via INTRAVENOUS

## 2016-12-18 MED ORDER — PHENYLEPHRINE 40 MCG/ML (10ML) SYRINGE FOR IV PUSH (FOR BLOOD PRESSURE SUPPORT)
PREFILLED_SYRINGE | INTRAVENOUS | Status: AC
  Filled 2016-12-18: qty 10

## 2016-12-18 MED ORDER — ASPIRIN 81 MG PO CHEW
CHEWABLE_TABLET | ORAL | Status: AC
  Filled 2016-12-18: qty 1

## 2016-12-18 MED ORDER — SODIUM CHLORIDE 0.9 % IR SOLN
Status: DC | PRN
  Administered 2016-12-18 (×2): 3000 mL via INTRAVESICAL

## 2016-12-18 MED ORDER — AMLODIPINE BESYLATE 5 MG PO TABS
5.0000 mg | ORAL_TABLET | Freq: Every day | ORAL | Status: DC
  Administered 2016-12-18 – 2016-12-19 (×2): 5 mg via ORAL
  Filled 2016-12-18: qty 1

## 2016-12-18 MED ORDER — ONDANSETRON HCL 4 MG/2ML IJ SOLN
4.0000 mg | INTRAMUSCULAR | Status: DC | PRN
  Filled 2016-12-18: qty 2

## 2016-12-18 MED ORDER — DIPHENHYDRAMINE HCL 50 MG/ML IJ SOLN
12.5000 mg | Freq: Four times a day (QID) | INTRAMUSCULAR | Status: DC | PRN
  Filled 2016-12-18: qty 0.25

## 2016-12-18 MED ORDER — ARTIFICIAL TEARS OPHTHALMIC OINT
TOPICAL_OINTMENT | OPHTHALMIC | Status: AC
  Filled 2016-12-18: qty 3.5

## 2016-12-18 MED ORDER — DOCUSATE SODIUM 100 MG PO CAPS
100.0000 mg | ORAL_CAPSULE | Freq: Two times a day (BID) | ORAL | Status: DC
  Administered 2016-12-18: 100 mg via ORAL
  Filled 2016-12-18: qty 1

## 2016-12-18 MED ORDER — HYOSCYAMINE SULFATE 0.125 MG SL SUBL
0.1250 mg | SUBLINGUAL_TABLET | SUBLINGUAL | Status: DC | PRN
  Administered 2016-12-19: 0.125 mg via SUBLINGUAL
  Filled 2016-12-18: qty 1

## 2016-12-18 MED ORDER — SODIUM CHLORIDE 0.9 % IR SOLN
3000.0000 mL | Status: DC
  Filled 2016-12-18: qty 3000

## 2016-12-18 MED ORDER — FLEET ENEMA 7-19 GM/118ML RE ENEM
1.0000 | ENEMA | Freq: Once | RECTAL | Status: DC | PRN
  Filled 2016-12-18: qty 1

## 2016-12-18 MED ORDER — POTASSIUM CHLORIDE CRYS ER 10 MEQ PO TBCR
10.0000 meq | EXTENDED_RELEASE_TABLET | Freq: Every day | ORAL | Status: DC
  Administered 2016-12-19: 10 meq via ORAL
  Filled 2016-12-18: qty 1

## 2016-12-18 MED ORDER — ACETAMINOPHEN 325 MG PO TABS
650.0000 mg | ORAL_TABLET | ORAL | Status: DC | PRN
  Filled 2016-12-18: qty 2

## 2016-12-18 MED ORDER — ONDANSETRON HCL 4 MG/2ML IJ SOLN
INTRAMUSCULAR | Status: DC | PRN
  Administered 2016-12-18: 4 mg via INTRAVENOUS

## 2016-12-18 MED ORDER — PROPOFOL 10 MG/ML IV BOLUS
INTRAVENOUS | Status: AC
  Filled 2016-12-18: qty 20

## 2016-12-18 MED ORDER — VALSARTAN 160 MG PO TABS
320.0000 mg | ORAL_TABLET | Freq: Every day | ORAL | Status: DC
  Administered 2016-12-18 – 2016-12-19 (×2): 320 mg via ORAL
  Filled 2016-12-18: qty 2

## 2016-12-18 MED ORDER — LIDOCAINE 2% (20 MG/ML) 5 ML SYRINGE
INTRAMUSCULAR | Status: DC | PRN
  Administered 2016-12-18: 100 mg via INTRAVENOUS

## 2016-12-18 MED ORDER — CIPROFLOXACIN IN D5W 400 MG/200ML IV SOLN
400.0000 mg | INTRAVENOUS | Status: AC
  Administered 2016-12-18: 400 mg via INTRAVENOUS
  Filled 2016-12-18: qty 200

## 2016-12-18 MED ORDER — FENTANYL CITRATE (PF) 100 MCG/2ML IJ SOLN
INTRAMUSCULAR | Status: DC | PRN
  Administered 2016-12-18: 50 ug via INTRAVENOUS

## 2016-12-18 MED ORDER — VANCOMYCIN HCL 500 MG IV SOLR
INTRAVENOUS | Status: AC
Start: 2016-12-18 — End: ?
  Filled 2016-12-18: qty 500

## 2016-12-18 MED ORDER — ZOLPIDEM TARTRATE 5 MG PO TABS
5.0000 mg | ORAL_TABLET | Freq: Every evening | ORAL | Status: DC | PRN
  Filled 2016-12-18: qty 1

## 2016-12-18 MED ORDER — FENTANYL CITRATE (PF) 100 MCG/2ML IJ SOLN
INTRAMUSCULAR | Status: AC
  Filled 2016-12-18: qty 2

## 2016-12-18 MED ORDER — PHENYLEPHRINE HCL 10 MG/ML IJ SOLN
INTRAMUSCULAR | Status: DC | PRN
  Administered 2016-12-18 (×2): 120 ug via INTRAVENOUS
  Administered 2016-12-18 (×2): 80 ug via INTRAVENOUS

## 2016-12-18 MED ORDER — HYDROMORPHONE HCL 1 MG/ML IJ SOLN
0.5000 mg | INTRAMUSCULAR | Status: DC | PRN
  Filled 2016-12-18: qty 1

## 2016-12-18 MED ORDER — SENNOSIDES-DOCUSATE SODIUM 8.6-50 MG PO TABS
1.0000 | ORAL_TABLET | Freq: Every evening | ORAL | Status: DC | PRN
  Filled 2016-12-18: qty 1

## 2016-12-18 MED ORDER — VANCOMYCIN HCL IN DEXTROSE 1-5 GM/200ML-% IV SOLN
1000.0000 mg | Freq: Once | INTRAVENOUS | Status: AC
  Filled 2016-12-18: qty 200

## 2016-12-18 MED ORDER — SODIUM CHLORIDE 0.9 % IV SOLN
INTRAVENOUS | Status: AC
Start: 2016-12-18 — End: ?
  Filled 2016-12-18: qty 100

## 2016-12-18 MED ORDER — POTASSIUM 99 MG PO TABS
99.0000 mg | ORAL_TABLET | Freq: Every day | ORAL | Status: DC
  Administered 2016-12-18: 99 mg via ORAL

## 2016-12-18 MED ORDER — SODIUM CHLORIDE 0.9 % IV SOLN
INTRAVENOUS | Status: AC
  Filled 2016-12-18: qty 100

## 2016-12-18 MED ORDER — FENTANYL CITRATE (PF) 100 MCG/2ML IJ SOLN
25.0000 ug | INTRAMUSCULAR | Status: DC | PRN
  Filled 2016-12-18: qty 1

## 2016-12-18 MED ORDER — DIPHENHYDRAMINE HCL 12.5 MG/5ML PO ELIX
12.5000 mg | ORAL_SOLUTION | Freq: Four times a day (QID) | ORAL | Status: DC | PRN
  Filled 2016-12-18: qty 5

## 2016-12-18 MED ORDER — DEXAMETHASONE SODIUM PHOSPHATE 10 MG/ML IJ SOLN
INTRAMUSCULAR | Status: DC | PRN
  Administered 2016-12-18: 10 mg via INTRAVENOUS

## 2016-12-18 MED ORDER — VANCOMYCIN HCL IN DEXTROSE 1-5 GM/200ML-% IV SOLN: 1000.0000 mg | Freq: Once | INTRAVENOUS | Status: DC

## 2016-12-18 MED ORDER — LACTATED RINGERS IV SOLN
INTRAVENOUS | Status: DC
  Administered 2016-12-18 (×2): via INTRAVENOUS
  Filled 2016-12-18: qty 1000

## 2016-12-18 MED ORDER — DOCUSATE SODIUM 100 MG PO CAPS
ORAL_CAPSULE | ORAL | Status: AC
  Filled 2016-12-18: qty 1

## 2016-12-18 MED ORDER — NITROFURANTOIN MACROCRYSTAL 100 MG PO CAPS
100.0000 mg | ORAL_CAPSULE | Freq: Two times a day (BID) | ORAL | Status: DC
  Administered 2016-12-18 (×2): 100 mg via ORAL
  Filled 2016-12-18: qty 1

## 2016-12-18 MED ORDER — OXYCODONE HCL 5 MG PO TABS
5.0000 mg | ORAL_TABLET | ORAL | Status: DC | PRN
  Administered 2016-12-19: 5 mg via ORAL
  Filled 2016-12-18: qty 1

## 2016-12-18 MED ORDER — VANCOMYCIN HCL 500 MG IV SOLR
INTRAVENOUS | Status: AC
  Filled 2016-12-18: qty 500

## 2016-12-18 MED ORDER — BISACODYL 10 MG RE SUPP
10.0000 mg | Freq: Every day | RECTAL | Status: DC | PRN
  Filled 2016-12-18: qty 1

## 2016-12-18 MED ORDER — KETOROLAC TROMETHAMINE 30 MG/ML IJ SOLN
INTRAMUSCULAR | Status: DC | PRN
  Administered 2016-12-18: 30 mg via INTRAVENOUS

## 2016-12-18 MED ORDER — POTASSIUM CHLORIDE IN NACL 20-0.45 MEQ/L-% IV SOLN
INTRAVENOUS | Status: DC
  Administered 2016-12-18 – 2016-12-19 (×2): via INTRAVENOUS
  Filled 2016-12-18 (×4): qty 1000

## 2016-12-18 SURGICAL SUPPLY — 18 items
BAG URINE DRAINAGE (UROLOGICAL SUPPLIES) ×2 IMPLANT
BAG URO CATCHER STRL LF (MISCELLANEOUS) ×3 IMPLANT
CATH FOLEY 3WAY 30CC 22FR (CATHETERS) ×2 IMPLANT
COVER FOOTSWITCH UNIV (MISCELLANEOUS) ×2 IMPLANT
COVER SURGICAL LIGHT HANDLE (MISCELLANEOUS) ×3 IMPLANT
ELECT REM PT RETURN 15FT ADLT (MISCELLANEOUS) ×3 IMPLANT
GLOVE SURG SS PI 8.0 STRL IVOR (GLOVE) ×2 IMPLANT
GOWN STRL REUS W/TWL XL LVL3 (GOWN DISPOSABLE) ×3 IMPLANT
HOLDER FOLEY CATH W/STRAP (MISCELLANEOUS) ×2 IMPLANT
IV NS IRRIG 3000ML ARTHROMATIC (IV SOLUTION) ×6 IMPLANT
LOOP CUT BIPOLAR 24F LRG (ELECTROSURGICAL) ×2 IMPLANT
MANIFOLD NEPTUNE II (INSTRUMENTS) ×3 IMPLANT
PACK CYSTO (CUSTOM PROCEDURE TRAY) ×3 IMPLANT
SET ASPIRATION TUBING (TUBING) ×3 IMPLANT
SYR 30ML LL (SYRINGE) ×2 IMPLANT
SYRINGE IRR TOOMEY STRL 70CC (SYRINGE) IMPLANT
TUBING CONNECTING 10 (TUBING) ×2 IMPLANT
TUBING CONNECTING 10' (TUBING) ×1

## 2016-12-18 NOTE — Anesthesia Preprocedure Evaluation (Addendum)
Anesthesia Evaluation  Patient identified by MRN, date of birth, ID band Patient awake    Reviewed: Allergy & Precautions, H&P , Patient's Chart, lab work & pertinent test results  Airway Mallampati: II  TM Distance: >3 FB Neck ROM: full    Dental no notable dental hx. (+) Edentulous Lower, Edentulous Upper   Pulmonary    Pulmonary exam normal breath sounds clear to auscultation       Cardiovascular Exercise Tolerance: Good hypertension,  Rhythm:regular Rate:Normal     Neuro/Psych    GI/Hepatic   Endo/Other    Renal/GU      Musculoskeletal   Abdominal   Peds  Hematology   Anesthesia Other Findings   Reproductive/Obstetrics                            Anesthesia Physical Anesthesia Plan  ASA: II  Anesthesia Plan: General   Post-op Pain Management:    Induction: Intravenous  PONV Risk Score and Plan: 1 and Treatment may vary due to age or medical condition, Dexamethasone and Ondansetron  Airway Management Planned: LMA  Additional Equipment:   Intra-op Plan:   Post-operative Plan:   Informed Consent: I have reviewed the patients History and Physical, chart, labs and discussed the procedure including the risks, benefits and alternatives for the proposed anesthesia with the patient or authorized representative who has indicated his/her understanding and acceptance.     Plan Discussed with: CRNA and Surgeon  Anesthesia Plan Comments: ( )        Anesthesia Quick Evaluation

## 2016-12-18 NOTE — Anesthesia Procedure Notes (Signed)
Procedure Name: LMA Insertion Date/Time: 12/18/2016 10:44 AM Performed by: Wanita Chamberlain, CRNA Pre-anesthesia Checklist: Patient identified, Timeout performed, Emergency Drugs available, Suction available and Patient being monitored Patient Re-evaluated:Patient Re-evaluated prior to induction Oxygen Delivery Method: Circle system utilized Preoxygenation: Pre-oxygenation with 100% oxygen Induction Type: IV induction Ventilation: Mask ventilation without difficulty LMA: LMA inserted LMA Size: 4.0 Number of attempts: 1

## 2016-12-18 NOTE — Op Note (Signed)
Preoperative diagnosis: 1. Bladder outlet obstruction secondary to BPH  Postoperative diagnosis:  1. Bladder outlet obstruction secondary to BPH  Procedure:  1. Cystoscopy 2. Transurethral Resectioni of the prostate  Surgeon: Irine Seal. M.D.  Anesthesia: general  Complications: None  EBL: minimal  Specimens: 1. Prostate chips   Disposition of specimens: Pathology  Indication: Dustin Lucas is a patient with bladder outlet obstruction secondary to benign prostatic hyperplasia. After reviewing the management options for treatment, he elected to proceed with the above surgical procedure(s). We have discussed the potential benefits and risks of the procedure, side effects of the proposed treatment, the likelihood of the patient achieving the goals of the procedure, and any potential problems that might occur during the procedure or recuperation. Informed consent has been obtained.  Description of procedure:  The patient was taken to the operating room and general anesthesia was induced.  The patient was placed in the dorsal lithotomy position, prepped and draped in the usual sterile fashion, and preoperative antibiotics were administered. A preoperative time-out was performed.   Cystourethroscopy was performed.  The patient's urethra was examined and was normal/. The external sphincter was intact.  There was a short prostatic urethra with  bilobar prostatic hypertrophy and a small median lobe with obstruction.   The bladder was then systematically examined in its entirety. There was severe trabeculation with multiple small diverticuli with no evidence of any bladder tumors or stones but there was catheter irritation on the posterior wall and some debris at the base of the bladder.   The ureteral orifices were identified and marked so as to be avoided during the procedure.  The prostate adenoma was then resected utilizing loop cautery resection with the bipolar cutting loop.  The  prostate adenoma from the bladder neck back to the verumontanum was resected beginning at the six o'clock position and then extended to include the right and left lobes of the prostate and anterior prostate. Care was taken not to resect distal to the verumontanum.  At the completion of the procedure the bladder was evacuated free of chips and hemostasis was insured.  Final inspection revealed intact ureteral orifices, a widely patent TUR channel and an intact external sphincter.   Hemostasis was then achieved with the cautery and the bladder was emptied and reinspected with no significant bleeding noted at the end of the procedure.    A 34fr 3 way catheter was then placed into the bladder and placed on continuous bladder irrigation.  The patient appeared to tolerate the procedure well and without complications.  The patient was able to be awakened and transferred to the recovery unit in satisfactory condition.

## 2016-12-18 NOTE — Transfer of Care (Signed)
Immediate Anesthesia Transfer of Care Note  Patient: Dustin Lucas  Procedure(s) Performed: TRANSURETHRAL RESECTION OF THE PROSTATE (TURP) (N/A Bladder)  Patient Location: PACU  Anesthesia Type:General  Level of Consciousness: awake, alert , oriented and patient cooperative  Airway & Oxygen Therapy: Patient Spontanous Breathing and Patient connected to nasal cannula oxygen  Post-op Assessment: Report given to RN and Post -op Vital signs reviewed and stable  Post vital signs: Reviewed and stable  Last Vitals:  Vitals:   12/18/16 0858  BP: 136/77  Pulse: 86  Resp: 16  Temp: 36.9 C  SpO2: 100%    Last Pain:  Vitals:   12/18/16 1001  TempSrc:   PainSc: 5       Patients Stated Pain Goal: 3 (75/44/92 0100)  Complications: No apparent anesthesia complications

## 2016-12-18 NOTE — Interval H&P Note (Signed)
History and Physical Interval Note:  12/18/2016 9:35 AM  Dustin Lucas  has presented today for surgery, with the diagnosis of BENIGN PROSTATE HYPERPLASIA WITH RETENTION  The various methods of treatment have been discussed with the patient and family. After consideration of risks, benefits and other options for treatment, the patient has consented to  Procedure(s): TRANSURETHRAL RESECTION OF THE PROSTATE (TURP) (N/A) as a surgical intervention .  The patient's history has been reviewed, patient examined, no change in status, stable for surgery.  I have reviewed the patient's chart and labs.  Questions were answered to the patient's satisfaction.     Irine Seal

## 2016-12-18 NOTE — Anesthesia Postprocedure Evaluation (Signed)
Anesthesia Post Note  Patient: Dustin Lucas  Procedure(s) Performed: TRANSURETHRAL RESECTION OF THE PROSTATE (TURP) (N/A Bladder)     Patient location during evaluation: PACU Anesthesia Type: General Level of consciousness: awake and alert Pain management: pain level controlled Vital Signs Assessment: post-procedure vital signs reviewed and stable Respiratory status: spontaneous breathing, nonlabored ventilation, respiratory function stable and patient connected to nasal cannula oxygen Cardiovascular status: blood pressure returned to baseline and stable Postop Assessment: no apparent nausea or vomiting Anesthetic complications: no    Last Vitals:  Vitals:   12/18/16 1300 12/18/16 1330  BP: (!) 176/85 125/77  Pulse: (!) 57 75  Resp: 14 13  Temp:    SpO2: 100% 100%    Last Pain:  Vitals:   12/18/16 1001  TempSrc:   PainSc: 5                  [name] EDWARD

## 2016-12-18 NOTE — Progress Notes (Signed)
Pharmacy Antibiotic Note  Dustin Lucas is a 76 y.o. male admitted on 12/18/2016 with UTI.  Pharmacy has been consulted for Vancomycin dosing.  Plan: 1) Can only assume patient is going to be discharged from [facility] so will give a 1 time vancomycin 1g dose 2) Sign off protocol  Height: 6\' 3"  (190.5 cm) Weight: 181 lb (82.1 kg) IBW/kg (Calculated) : 84.5  Temp (24hrs), Avg:98.1 F (36.7 C), Min:97.7 F (36.5 C), Max:98.5 F (36.9 C)  Recent Labs  Lab 12/14/16 1222  WBC 4.0  CREATININE 1.0    Estimated Creatinine Clearance: 73 mL/min (by C-G formula based on SCr of 1 mg/dL).    Allergies  Allergen Reactions  . Shellfish Allergy Itching     Thank you for allowing pharmacy to be a part of this patient's care.   Adrian Saran, PharmD, BCPS Pager [phone] 12/18/2016 12:40 PM

## 2016-12-18 NOTE — Discharge Instructions (Signed)
Transurethral Resection of the Prostate, Care After °Refer to this sheet in the next few weeks. These instructions provide you with information about caring for yourself after your procedure. Your health care provider may also give you more specific instructions. Your treatment has been planned according to current medical practices, but problems sometimes occur. Call your health care provider if you have any problems or questions after your procedure. °What can I expect after the procedure? °After the procedure, it is common to have: °· Mild pain in your lower abdomen. °· Soreness or mild discomfort in your penis from having the catheter inserted during the procedure. °· A feeling of urgency when you need to urinate. °· A small amount of blood in your urine. You may notice some small blood clots in your urine. These are normal. ° °Follow these instructions at home: °Medicines ° °· Take over-the-counter and prescription medicines only as told by your health care provider. °· Do not drive or operate heavy machinery while taking prescription pain medicine. °· Do not drive for 24 hours if you received a sedative. °· If you were prescribed antibiotic medicine, take it as told by your health care provider. Do not stop taking the antibiotic even if you start to feel better. °Activity °· Return to your normal activities as told by your health care provider. Ask your health care provider what activities are safe for you. °· Do not lift anything that is heavier than 10 lb (4.5 kg) for 3 weeks after your procedure, or as long as told by your health care provider. °· Avoid intense physical activity for as long as told by your health care provider. °· Walk at least one time every day. This helps to prevent blood clots. You may increase your physical activity gradually as you start to feel better. °Lifestyle °· Do not drink alcohol for as long as told by your health care provider. This is especially important if you are taking  prescription pain medicines. °· Do not engage in sexual activity until your health care provider says that you can do this. °General instructions °· Do not take baths, swim, or use a hot tub until your health care provider approves. °· Drink enough fluid to keep your urine clear or pale yellow. °· Urinate as soon as you feel the need to. Do not try to hold your urine for long periods of time. °· If your health care provider approves, you may take a stool softener for 2-3 weeks to prevent you from straining to have a bowel movement. °· Wear compression stockings as told by your health care provider. These stockings help to prevent blood clots and reduce swelling in your legs. °· Keep all follow-up visits as told by your health care provider. This is important. °Contact a health care provider if: °· You have difficulty urinating. °· You have a fever. °· You have pain that gets worse or does not improve with medicine. °· You have blood in your urine that does not go away after 1 week of resting and drinking more fluids. °· You have swelling in your penis or testicles. °Get help right away if: °· You are unable to urinate. °· You are having more blood clots in your urine instead of fewer. °· You have: °? Large blood clots. °? A lot of blood in your urine. °? Pain in your back or lower abdomen. °? Pain or swelling in your legs. °? Chills and you are shaking. °This information is not intended to   replace advice given to you by your health care provider. Make sure you discuss any questions you have with your health care provider. °Document Released: 12/18/2004 Document Revised: 08/21/2015 Document Reviewed: 09/09/2014 °Elsevier Interactive Patient Education © 2017 [employer] Inc[employer] ° °

## 2016-12-19 ENCOUNTER — Encounter (HOSPITAL_BASED_OUTPATIENT_CLINIC_OR_DEPARTMENT_OTHER): Payer: Self-pay | Admitting: Urology

## 2016-12-19 DIAGNOSIS — N401 Enlarged prostate with lower urinary tract symptoms: Secondary | ICD-10-CM | POA: Diagnosis not present

## 2016-12-19 MED ORDER — HYOSCYAMINE SULFATE 0.125 MG SL SUBL
SUBLINGUAL_TABLET | SUBLINGUAL | Status: AC
  Filled 2016-12-19: qty 1

## 2016-12-19 MED ORDER — OXYCODONE HCL 5 MG PO TABS
ORAL_TABLET | ORAL | Status: AC
  Filled 2016-12-19: qty 1

## 2016-12-19 NOTE — Discharge Summary (Signed)
Physician Discharge Summary  Patient ID: Dustin Lucas MRN: [id number] DOB/AGE: Feb 11, 1940 76 y.o.  Admit date: 12/18/2016 Discharge date: 12/19/2016  Admission Diagnoses:  <principal problem not specified>  Discharge Diagnoses:  Active Problems:   BPH with obstruction/lower urinary tract symptoms   Past Medical History:  Diagnosis Date  . Arthritis   . Asthma    as a child   . Cancer ([place]) 7-8 yrs ago   prostate cancer, monitoring psa levels q 6 months; pancreatic cancer   . Hx of radiation therapy   . Hypertension   . PONV (postoperative nausea and vomiting)    in past , none recent  . Urinary frequency     Surgeries: Procedure(s): TRANSURETHRAL RESECTION OF THE PROSTATE (TURP) on 12/18/2016   Consultants (if any):   Discharged Condition: Improved  Hospital Course: Dustin Lucas is an 76 y.o. male who was admitted 12/18/2016 with a diagnosis of <principal problem not specified> and went to the operating room on 12/18/2016 and underwent the above named procedures.  His foley was removed this morning and he was able to void with a low PVR and was felt to be ready for discharge.   He was given perioperative antibiotics:  Anti-infectives (From admission, onward)   Start     Dose/Rate Route Frequency Ordered Stop   12/18/16 1245  vancomycin (VANCOCIN) IVPB 1000 mg/200 mL premix     1,000 mg 200 mL/hr over 60 Minutes Intravenous  Once 12/18/16 1240 12/18/16 1345   12/18/16 1215  vancomycin (VANCOCIN) IVPB 1000 mg/200 mL premix  Status:  Discontinued     1,000 mg 200 mL/hr over 60 Minutes Intravenous  Once 12/18/16 1204 12/18/16 1206   12/18/16 0945  ciprofloxacin (CIPRO) IVPB 400 mg     400 mg 200 mL/hr over 60 Minutes Intravenous 60 min pre-op 12/18/16 0945 12/18/16 1137    .  He was given sequential compression devices for DVT prophylaxis.  He benefited maximally from the hospital stay and there were no complications.    Recent vital signs:  Vitals:   12/19/16  0400 12/19/16 0906  BP: 117/79 113/66  Pulse: (!) 55 74  Resp: 16 17  Temp: 98.6 F (37 C) 98.9 F (37.2 C)  SpO2: 100% 100%    Recent laboratory studies:  Lab Results  Component Value Date   HGB 13.0 12/14/2016   HGB 11.9 (L) 04/24/2016   HGB 11.4 (L) 04/10/2016   Lab Results  Component Value Date   WBC 4.0 12/14/2016   PLT 159 12/14/2016   Lab Results  Component Value Date   INR 1.09 12/08/2015   Lab Results  Component Value Date   NA 142 12/14/2016   K 3.6 12/14/2016   CL 105 12/08/2015   CO2 23 12/14/2016   BUN 10.3 12/14/2016   CREATININE 1.0 12/14/2016   GLUCOSE 96 12/14/2016    Discharge Medications:   Allergies as of 12/19/2016      Reactions   Shellfish Allergy Itching      Medication List    STOP taking these medications   acetaminophen 325 MG tablet Commonly known as:  TYLENOL   prochlorperazine 10 MG tablet Commonly known as:  COMPAZINE     TAKE these medications   amLODipine-valsartan 5-320 MG tablet Commonly known as:  EXFORGE Take 1 tablet by mouth daily.   aspirin 81 MG chewable tablet Chew 1 tablet (81 mg total) by mouth daily. Start taking 08/29/2015   lidocaine-prilocaine cream Commonly known as:  EMLA  Apply to portacath site 1 hour prior to use What changed:    how much to take  how to take this  when to take this  reasons to take this  additional instructions   multivitamin with minerals Tabs tablet Take 1 tablet by mouth daily.   nitrofurantoin 100 MG capsule Commonly known as:  MACRODANTIN Take 100 mg by mouth 2 (two) times daily.   Potassium 99 MG Tabs Take 99 mg by mouth daily.   Vitamin D-3 1000 units Caps Take 2,000 Units by mouth daily.       Diagnostic Studies: No results found.  Disposition: 01-Home or Self Care  Discharge Instructions    Discontinue IV   Complete by:  As directed       Follow-up Information    Irine Seal, MD On 01/03/2017.   Specialty:  Urology Why:  [address]  information: [place] [id number] [phone]            Signed: Irine Seal 12/19/2016, 11:40 AM

## 2017-01-22 ENCOUNTER — Encounter: Payer: Self-pay | Admitting: Nurse Practitioner

## 2017-01-22 ENCOUNTER — Inpatient Hospital Stay (HOSPITAL_BASED_OUTPATIENT_CLINIC_OR_DEPARTMENT_OTHER): Payer: Medicare Other | Admitting: Nurse Practitioner

## 2017-01-22 ENCOUNTER — Telehealth: Payer: Self-pay | Admitting: Nurse Practitioner

## 2017-01-22 ENCOUNTER — Inpatient Hospital Stay: Payer: Medicare Other | Attending: Oncology

## 2017-01-22 VITALS — BP 127/78 | HR 88 | Temp 98.4°F | Resp 18 | Ht 75.0 in | Wt 181.2 lb

## 2017-01-22 DIAGNOSIS — C250 Malignant neoplasm of head of pancreas: Secondary | ICD-10-CM | POA: Diagnosis not present

## 2017-01-22 DIAGNOSIS — I10 Essential (primary) hypertension: Secondary | ICD-10-CM | POA: Diagnosis not present

## 2017-01-22 DIAGNOSIS — R630 Anorexia: Secondary | ICD-10-CM | POA: Diagnosis not present

## 2017-01-22 DIAGNOSIS — Z95828 Presence of other vascular implants and grafts: Secondary | ICD-10-CM

## 2017-01-22 DIAGNOSIS — Z452 Encounter for adjustment and management of vascular access device: Secondary | ICD-10-CM | POA: Insufficient documentation

## 2017-01-22 MED ORDER — HEPARIN SOD (PORK) LOCK FLUSH 100 UNIT/ML IV SOLN
500.0000 [IU] | Freq: Once | INTRAVENOUS | Status: AC | PRN
  Administered 2017-01-22: 500 [IU] via INTRAVENOUS
  Filled 2017-01-22: qty 5

## 2017-01-22 MED ORDER — SODIUM CHLORIDE 0.9% FLUSH
10.0000 mL | INTRAVENOUS | Status: DC | PRN
  Administered 2017-01-22: 10 mL via INTRAVENOUS
  Filled 2017-01-22: qty 10

## 2017-01-22 NOTE — Progress Notes (Signed)
Dustin Lucas OFFICE PROGRESS NOTE   Diagnosis: Pancreas cancer  INTERVAL HISTORY:   Dustin Lucas returns as scheduled.  He overall feels well.  No abdominal pain.  He has a good appetite.  He continues to have bilateral hip pain.  He notes significant improvement in the urinary frequency since undergoing the TURP procedure.  Objective:  Vital signs in last 24 hours:  Blood pressure 127/78, pulse 88, temperature 98.4 F (36.9 C), temperature source Oral, resp. rate 18, height 6\' 3"  (1.905 m), weight 181 lb 3.2 oz (82.2 kg), SpO2 100 %.    HEENT: Neck without mass. Lymphatics: No palpable cervical, supraclavicular or axillary lymph nodes. Resp: Lungs clear bilaterally. Cardio: Regular rate and rhythm. GI: Abdomen soft and nontender.  No hepatomegaly.  No mass. Vascular: No leg edema. Port-A-Cath without erythema.  Lab Results:  Lab Results  Component Value Date   WBC 4.0 12/14/2016   HGB 13.0 12/14/2016   HCT 38.9 12/14/2016   MCV 95.6 12/14/2016   PLT 159 12/14/2016   NEUTROABS 2.6 12/14/2016    Imaging:  No results found.  Medications: I have reviewed the patient's current medications.  Assessment/Plan: 1. Pancreas cancer-presenting with obstructive jaundice  CT 08/18/2015 with intra-and extrahepatic Dustin Lucas dilatation and dilatation of the pancreas duct, vague fullness at the head of the pancreas  EUS 10/05/2015 confirmed a pancreas head mass with no evidence of vascular invasion and no peripancreatic adenopathy (uT2,uN0)  EUS biopsy of the pancreas head mass on 10/05/2015 confirmed adenocarcinoma  CT chest[facility]abdomen at [facility] 11/16/2015-hypoenhancing mass in the head and uncinate process of the pancreas with increased markedpancreatic ductal dilatation and greater than 180 encasement of the superior mesenteric artery and superior mesenteric vein above the level of the first tributary.  Cycle 1 FOLFOX 12/12/2015  Cycle 2 FOLFOX 12/27/2015  Cycle  3 FOLFOX 01/24/2016-Neulasta added  Cycle 4 FOLFOX 02/14/2016  Restaging CT scans 03/02/2016-decreasing size of pancreatic head/uncinate process mass with decreased encasement of the SMV (less than 180) and decreased pancreatic ductal dilatation. There remains greater than 180 of encasement of the SMA which is patent. Unchanged position of colon bile duct stent with decreasing right-sided pneumobilia. Stable 6 mm right upper lobe nodule.  Cycle 5 FOLFOX 03/13/2016 (oxaliplatin on hold due to poor tolerance)  Cycle 6 FOLFOX 03/27/2016 (oxaliplatin held)  Cycle 7 FOLFOX 04/10/2016 (oxaliplatin held)  Cycle 8 FOLFOX 04/24/2016 (oxaliplatin held)  MRI of the abdomen at [facility] on 05/23/2016 showed unchanged pancreatic head mass as compared to 05/02/2016 CT with similar encasement of the SMA and abutment/encasement of the SMV. No evidence of metastatic disease in the upper abdomen.   He completed a course of radiation delivered over 5 fractions on 06/18/2016 at [facility].  MRI abdomen at [facility] 10/30/2016- mild increase in size of the pancreas head mass with encasement of the SMA and SMV, no evidence of metastatic disease  2. Obstructive jaundice secondary to #1-status postplacement of a bile duct stent  3. Hypertension  4. Anorexia/weight loss secondary to #1  5. 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    BPH with obstructive symptoms status post cystoscopy, transurethral resection of the prostate 12/18/2016.     Disposition: Mr. Dustin Lucas appears stable.  There is no clinical evidence of disease progression.  He is scheduled for follow-up with Dr. Aleatha Lucas and Dr. Barbie Lucas at [facility] 01/30/2017.  He will return for a  Port-A-Cath flush in 6 weeks and a follow-up visit in 12 weeks.  Plan reviewed with Dr. Benay Lucas.    Dustin Lucas ANP/GNP-BC   01/22/2017  10:53  AM

## 2017-01-22 NOTE — Telephone Encounter (Signed)
Gave patient avs and calendar with appts per 1/22 los.  °

## 2017-03-05 ENCOUNTER — Inpatient Hospital Stay: Payer: Medicare Other | Attending: Oncology

## 2017-03-05 VITALS — BP 96/72 | HR 93 | Temp 98.1°F | Resp 20

## 2017-03-05 DIAGNOSIS — Z452 Encounter for adjustment and management of vascular access device: Secondary | ICD-10-CM | POA: Insufficient documentation

## 2017-03-05 DIAGNOSIS — C250 Malignant neoplasm of head of pancreas: Secondary | ICD-10-CM | POA: Diagnosis not present

## 2017-03-05 DIAGNOSIS — Z95828 Presence of other vascular implants and grafts: Secondary | ICD-10-CM

## 2017-03-05 MED ORDER — HEPARIN SOD (PORK) LOCK FLUSH 100 UNIT/ML IV SOLN
500.0000 [IU] | Freq: Once | INTRAVENOUS | Status: AC | PRN
  Administered 2017-03-05: 500 [IU] via INTRAVENOUS
  Filled 2017-03-05: qty 5

## 2017-03-05 MED ORDER — SODIUM CHLORIDE 0.9% FLUSH
10.0000 mL | INTRAVENOUS | Status: DC | PRN
  Administered 2017-03-05: 10 mL via INTRAVENOUS
  Filled 2017-03-05: qty 10

## 2017-03-05 NOTE — Patient Instructions (Signed)
Implanted Port Home Guide An implanted port is a type of central line that is placed under the skin. Central lines are used to provide IV access when treatment or nutrition needs to be given through a person's veins. Implanted ports are used for long-term IV access. An implanted port may be placed because:  You need IV medicine that would be irritating to the small veins in your hands or arms.  You need long-term IV medicines, such as antibiotics.  You need IV nutrition for a long period.  You need frequent blood draws for lab tests.  You need dialysis.  Implanted ports are usually placed in the chest area, but they can also be placed in the upper arm, the abdomen, or the leg. An implanted port has two main parts:  Reservoir. The reservoir is round and will appear as a small, raised area under your skin. The reservoir is the part where a needle is inserted to give medicines or draw blood.  Catheter. The catheter is a thin, flexible tube that extends from the reservoir. The catheter is placed into a large vein. Medicine that is inserted into the reservoir goes into the catheter and then into the vein.  How will I care for my incision site? Do not get the incision site wet. Bathe or shower as directed by your health care provider. How is my port accessed? Special steps must be taken to access the port:  Before the port is accessed, a numbing cream can be placed on the skin. This helps numb the skin over the port site.  Your health care provider uses a sterile technique to access the port. ? Your health care provider must put on a mask and sterile gloves. ? The skin over your port is cleaned carefully with an antiseptic and allowed to dry. ? The port is gently pinched between sterile gloves, and a needle is inserted into the port.  Only "non-coring" port needles should be used to access the port. Once the port is accessed, a blood return should be checked. This helps ensure that the port  is in the vein and is not clogged.  If your port needs to remain accessed for a constant infusion, a clear (transparent) bandage will be placed over the needle site. The bandage and needle will need to be changed every week, or as directed by your health care provider.  Keep the bandage covering the needle clean and dry. Do not get it wet. Follow your health care provider's instructions on how to take a shower or bath while the port is accessed.  If your port does not need to stay accessed, no bandage is needed over the port.  What is flushing? Flushing helps keep the port from getting clogged. Follow your health care provider's instructions on how and when to flush the port. Ports are usually flushed with saline solution or a medicine called heparin. The need for flushing will depend on how the port is used.  If the port is used for intermittent medicines or blood draws, the port will need to be flushed: ? After medicines have been given. ? After blood has been drawn. ? As part of routine maintenance.  If a constant infusion is running, the port may not need to be flushed.  How long will my port stay implanted? The port can stay in for as long as your health care provider thinks it is needed. When it is time for the port to come out, surgery will be   done to remove it. The procedure is similar to the one performed when the port was put in. When should I seek immediate medical care? When you have an implanted port, you should seek immediate medical care if:  You notice a bad smell coming from the incision site.  You have swelling, redness, or drainage at the incision site.  You have more swelling or pain at the port site or the surrounding area.  You have a fever that is not controlled with medicine.  This information is not intended to replace advice given to you by your health care provider. Make sure you discuss any questions you have with your health care provider. Document  Released: 12/18/2004 Document Revised: 05/26/2015 Document Reviewed: 08/25/2012 Elsevier Interactive Patient Education  2017 [employer]  

## 2017-04-08 ENCOUNTER — Ambulatory Visit: Payer: Medicare Other | Admitting: Nurse Practitioner

## 2017-04-08 ENCOUNTER — Telehealth: Payer: Self-pay

## 2017-04-08 NOTE — Telephone Encounter (Signed)
Spoke with pt. Pt voiced concern regarding recent weight loss since last appt in January. Pt states "ive lost about 20lbs or so". Pt denies nausea/vomiting, diarrhea or any accompanying symptoms other than weakness. Will make Dr. Benay Spice aware.   Per MD, pt can be seen tomorrow. Pt agreed. Scheduling message sent.

## 2017-04-09 ENCOUNTER — Encounter: Payer: Self-pay | Admitting: Nurse Practitioner

## 2017-04-09 ENCOUNTER — Inpatient Hospital Stay: Payer: Medicare Other | Attending: Oncology | Admitting: Nurse Practitioner

## 2017-04-09 ENCOUNTER — Inpatient Hospital Stay: Payer: Medicare Other

## 2017-04-09 VITALS — BP 110/73 | HR 89 | Temp 97.9°F | Resp 18 | Ht 75.0 in | Wt 160.9 lb

## 2017-04-09 DIAGNOSIS — C250 Malignant neoplasm of head of pancreas: Secondary | ICD-10-CM

## 2017-04-09 DIAGNOSIS — Z452 Encounter for adjustment and management of vascular access device: Secondary | ICD-10-CM | POA: Insufficient documentation

## 2017-04-09 DIAGNOSIS — Z95828 Presence of other vascular implants and grafts: Secondary | ICD-10-CM

## 2017-04-09 LAB — CBC WITH DIFFERENTIAL (CANCER CENTER ONLY)
BASOS ABS: 0 10*3/uL (ref 0.0–0.1)
BASOS PCT: 0 %
EOS PCT: 1 %
Eosinophils Absolute: 0 10*3/uL (ref 0.0–0.5)
HEMATOCRIT: 39.4 % (ref 38.4–49.9)
Hemoglobin: 13.5 g/dL (ref 13.0–17.1)
LYMPHS PCT: 24 %
Lymphs Abs: 1.3 10*3/uL (ref 0.9–3.3)
MCH: 32.5 pg (ref 27.2–33.4)
MCHC: 34.3 g/dL (ref 32.0–36.0)
MCV: 94.9 fL (ref 79.3–98.0)
MONO ABS: 0.5 10*3/uL (ref 0.1–0.9)
Monocytes Relative: 10 %
NEUTROS ABS: 3.4 10*3/uL (ref 1.5–6.5)
Neutrophils Relative %: 65 %
PLATELETS: 133 10*3/uL — AB (ref 140–400)
RBC: 4.15 MIL/uL — AB (ref 4.20–5.82)
RDW: 14.3 % (ref 11.0–14.6)
WBC: 5.2 10*3/uL (ref 4.0–10.3)

## 2017-04-09 LAB — CMP (CANCER CENTER ONLY)
ALK PHOS: 101 U/L (ref 40–150)
ALT: 27 U/L (ref 0–55)
ANION GAP: 10 (ref 3–11)
AST: 31 U/L (ref 5–34)
Albumin: 3.4 g/dL — ABNORMAL LOW (ref 3.5–5.0)
BILIRUBIN TOTAL: 1.3 mg/dL — AB (ref 0.2–1.2)
BUN: 15 mg/dL (ref 7–26)
CALCIUM: 9.9 mg/dL (ref 8.4–10.4)
CO2: 24 mmol/L (ref 22–29)
Chloride: 109 mmol/L (ref 98–109)
Creatinine: 0.97 mg/dL (ref 0.70–1.30)
GFR, Est AFR Am: >60 mL/min (ref >60)
Glucose, Bld: 94 mg/dL (ref 70–140)
POTASSIUM: 4.2 mmol/L (ref 3.5–5.1)
Sodium: 143 mmol/L (ref 136–145)
TOTAL PROTEIN: 6.9 g/dL (ref 6.4–8.3)

## 2017-04-09 MED ORDER — SODIUM CHLORIDE 0.9% FLUSH
10.0000 mL | INTRAVENOUS | Status: DC | PRN
  Administered 2017-04-09: 10 mL via INTRAVENOUS
  Filled 2017-04-09: qty 10

## 2017-04-09 MED ORDER — HEPARIN SOD (PORK) LOCK FLUSH 100 UNIT/ML IV SOLN
500.0000 [IU] | Freq: Once | INTRAVENOUS | Status: AC | PRN
  Administered 2017-04-09: 500 [IU] via INTRAVENOUS
  Filled 2017-04-09: qty 5

## 2017-04-09 NOTE — Progress Notes (Addendum)
[facility] OFFICE PROGRESS NOTE   Diagnosis: Pancreas cancer  INTERVAL HISTORY:   Mr. Cumpston returns prior to scheduled follow-up for evaluation of weight loss.  He was seen at [facility] by Dr. Aleatha Borer on 01/30/2017.  CT scan at that time showed unchanged size of pancreatic head mass with encasement of the SMA and SMV which remain patent.  Pneumobilia again noted.  No evidence of metastatic disease.  The CA-19-9 tumor marker was higher at 714.  Plan for continued surveillance with imaging and CA-19-9 at a 72-month interval.  Mr. Sequeira reports an approximate 20 pound weight loss over the past 2-4 weeks.  Appetite is really unchanged.  He denies nausea/vomiting.  No diarrhea.  He denies pain.  No fevers or chills.  He intermittently feels "washed out".  Objective:  Vital signs in last 24 hours:  Blood pressure 110/73, pulse 89, temperature 97.9 F (36.6 C), temperature source Oral, resp. rate 18, height 6\' 3"  (1.905 m), weight 160 lb 14.4 oz (73 kg), SpO2 100 %.    HEENT: White coating over tongue.  No buccal thrush. Lymphatics: No palpable cervical, supraclavicular, axillary or inguinal lymph nodes. Resp: Lungs clear bilaterally. Cardio: Regular rate and rhythm. GI: Abdomen soft and nontender.  No hepatomegaly.  No mass. Vascular: No leg edema. Port-A-Cath without erythema.  Lab Results:  Lab Results  Component Value Date   WBC 4.0 12/14/2016   HGB 13.0 12/14/2016   HCT 38.9 12/14/2016   MCV 95.6 12/14/2016   PLT 159 12/14/2016   NEUTROABS 2.6 12/14/2016    Imaging:  No results found.  Medications: I have reviewed the patient's current medications.  Assessment/Plan: 1. Pancreas cancer-presenting with obstructive jaundice  CT 08/18/2015 with intra-and extrahepatic [place] dilatation and dilatation of the pancreas duct, vague fullness at the head of the pancreas  EUS 10/05/2015 confirmed a pancreas head mass with no evidence of vascular invasion and no  peripancreatic adenopathy (uT2,uN0)  EUS biopsy of the pancreas head mass on 10/05/2015 confirmed adenocarcinoma  CT chest/abdomen at [facility] 11/16/2015-hypoenhancing mass in the head and uncinate process of the pancreas with increased markedpancreatic ductal dilatation and greater than 180 encasement of the superior mesenteric artery and superior mesenteric vein above the level of the first tributary.  Cycle 1 FOLFOX 12/12/2015  Cycle 2 FOLFOX 12/27/2015  Cycle 3 FOLFOX 01/24/2016-Neulasta added  Cycle 4 FOLFOX 02/14/2016  Restaging CT scans 03/02/2016-decreasing size of pancreatic head/uncinate process mass with decreased encasement of the SMV (less than 180) and decreased pancreatic ductal dilatation. There remains greater than 180 of encasement of the SMA which is patent. Unchanged position of colon bile duct stent with decreasing right-sided pneumobilia. Stable 6 mm right upper lobe nodule.  Cycle 5 FOLFOX 03/13/2016 (oxaliplatin on hold due to poor tolerance)  Cycle 6 FOLFOX 03/27/2016 (oxaliplatin held)  Cycle 7 FOLFOX 04/10/2016 (oxaliplatin held)  Cycle 8 FOLFOX 04/24/2016 (oxaliplatin held)  MRI of the abdomen at [facility] on 05/23/2016 showed unchanged pancreatic head mass as compared to 05/02/2016 CT with similar encasement of the SMA and abutment/encasement of the SMV. No evidence of metastatic disease in the upper abdomen.   He completed a course of radiation delivered over 5 fractions on 06/18/2016 at [facility].  MRI abdomen at [facility] 10/30/2016- mild increase in size of the pancreas head mass with encasement of the SMA and SMV, no evidence of metastatic disease  2. Obstructive jaundice secondary to #1-status postplacement of a bile duct stent  3. Hypertension  4. Anorexia/weight loss  secondary to #1  5. 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    BPH  with obstructive symptoms status post cystoscopy, transurethral resection of the prostate 12/18/2016.   Disposition: Mr. Joynt is a 77 year old man with pancreas cancer currently on observation.  He presents today with unexplained weight loss.  He understands we are concerned the weight loss is related to progression of the pancreas cancer.  We will obtain labs today to include a CBC, chemistry panel and CA-19-9 and refer him for restaging CT scans.  He would like for [place] to call him with the CT results.  He has a follow-up appointment at [facility] on 04/30/2017.  He will return for a follow-up visit here on 05/01/2017.  He will contact the office in the interim with any problems.  Patient seen with Dr. Benay Spice.  25 minutes were spent face-to-face at today's visit with the majority of that time involved in counseling/coordination of care.    Ned Card ANP/GNP-BC   04/09/2017  11:48 AM This was a shared visit with Ned Card.  Mr. Crisci was interviewed and examined.  He will be scheduled for restaging CTs and a CA 19-9.  We will forward these results to [facility].  We will see him after the appointment with Dr. Aleatha Borer to discuss treatment options.  We will consider gemcitabine/Abraxane if there is evidence of disease progression.  Julieanne Manson, MD

## 2017-04-10 LAB — CANCER ANTIGEN 19-9: CAN 19-9: 1290 U/mL — AB (ref 0–35)

## 2017-04-15 ENCOUNTER — Encounter (HOSPITAL_COMMUNITY): Payer: Self-pay

## 2017-04-15 ENCOUNTER — Ambulatory Visit (HOSPITAL_COMMUNITY)
Admission: RE | Admit: 2017-04-15 | Discharge: 2017-04-15 | Disposition: A | Discharge status: Home / Self Care | Payer: Medicare Other | Source: Ambulatory Visit | Attending: Nurse Practitioner | Admitting: Nurse Practitioner

## 2017-04-15 DIAGNOSIS — C250 Malignant neoplasm of head of pancreas: Secondary | ICD-10-CM | POA: Insufficient documentation

## 2017-04-15 DIAGNOSIS — K838 Other specified diseases of biliary tract: Secondary | ICD-10-CM | POA: Insufficient documentation

## 2017-04-15 MED ORDER — IOHEXOL 300 MG/ML  SOLN
100.0000 mL | Freq: Once | INTRAMUSCULAR | Status: AC | PRN
  Administered 2017-04-15: 100 mL via INTRAVENOUS

## 2017-04-15 MED ORDER — HEPARIN SOD (PORK) LOCK FLUSH 100 UNIT/ML IV SOLN
INTRAVENOUS | Status: AC
  Administered 2017-04-15: 500 [IU] via INTRAVENOUS
  Filled 2017-04-15: qty 5

## 2017-04-15 MED ORDER — HEPARIN SOD (PORK) LOCK FLUSH 100 UNIT/ML IV SOLN
500.0000 [IU] | Freq: Once | INTRAVENOUS | Status: AC
  Administered 2017-04-15: 500 [IU] via INTRAVENOUS

## 2017-04-16 ENCOUNTER — Ambulatory Visit: Payer: Medicare Other | Admitting: Oncology

## 2017-04-19 ENCOUNTER — Telehealth: Payer: Self-pay | Admitting: Emergency Medicine

## 2017-04-19 NOTE — Telephone Encounter (Addendum)
Left pt a vm msg. To call back in regards to note below.  ----- Message from Owens Shark, NP sent at 04/18/2017  4:37 PM EDT ----- Please call him--there is no evidence of metastatic disease on the recent CT scan; persistent pancreas mass with vascular encasement; Dr Benay Spice recommends he f/u as scheduled at [facility] for treatment plan.

## 2017-05-01 ENCOUNTER — Inpatient Hospital Stay: Payer: Medicare Other | Attending: Oncology | Admitting: Oncology

## 2017-05-01 ENCOUNTER — Inpatient Hospital Stay: Payer: Medicare Other

## 2017-05-01 ENCOUNTER — Telehealth: Payer: Self-pay | Admitting: Oncology

## 2017-05-01 VITALS — BP 90/63 | HR 88 | Temp 98.3°F | Resp 18 | Ht 75.0 in | Wt 153.4 lb

## 2017-05-01 DIAGNOSIS — C250 Malignant neoplasm of head of pancreas: Secondary | ICD-10-CM | POA: Diagnosis present

## 2017-05-01 DIAGNOSIS — R630 Anorexia: Secondary | ICD-10-CM | POA: Diagnosis not present

## 2017-05-01 DIAGNOSIS — Z452 Encounter for adjustment and management of vascular access device: Secondary | ICD-10-CM | POA: Diagnosis not present

## 2017-05-01 DIAGNOSIS — N390 Urinary tract infection, site not specified: Secondary | ICD-10-CM | POA: Insufficient documentation

## 2017-05-01 DIAGNOSIS — B961 Klebsiella pneumoniae [K. pneumoniae] as the cause of diseases classified elsewhere: Secondary | ICD-10-CM | POA: Insufficient documentation

## 2017-05-01 DIAGNOSIS — Z7189 Other specified counseling: Secondary | ICD-10-CM | POA: Diagnosis not present

## 2017-05-01 DIAGNOSIS — B370 Candidal stomatitis: Secondary | ICD-10-CM | POA: Insufficient documentation

## 2017-05-01 NOTE — Telephone Encounter (Signed)
Scheduled appt per 5/1 los - Gave patient aVs and calender per los.  

## 2017-05-01 NOTE — Progress Notes (Signed)
[facility] OFFICE PROGRESS NOTE   Diagnosis: Pancreas cancer  INTERVAL HISTORY:   Dustin Lucas returns as scheduled.  He continues to lose weight.  He had abdominal pain and diarrhea this week.  These were transient symptoms. He was seen at [facility] radiation oncology yesterday.  A CT showed an increase in the pancreas mass and increased biliary/pancreatic duct dilatation.  CTs here on 04/15/2017 revealed no evidence of metastatic disease to the chest.  Mild to moderate biliary dilatation.  The previously noted common duct stent was not visualized.  A 2.4 x 2 cm mass was noted at the medial aspect of the pancreas head with encasement of the SMA.  The mass was slightly smaller compared to the outside comparison CT from November 2017.   Objective:  Vital signs in last 24 hours:  Blood pressure 90/63, pulse 88, temperature 98.3 F (36.8 C), temperature source Oral, resp. rate 18, height 6\' 3"  (1.905 m), weight 153 lb 6.4 oz (69.6 kg), SpO2 100 %.   Resp: Lungs clear bilaterally Cardio: Regular rate and rhythm GI: No hepatomegaly, nontender, no mass Vascular: No leg edema    Portacath/PICC-without erythema  Lab Results:  Lab Results  Component Value Date   WBC 5.2 04/09/2017   HGB 13.5 04/09/2017   HCT 39.4 04/09/2017   MCV 94.9 04/09/2017   PLT 133 (L) 04/09/2017   NEUTROABS 3.4 04/09/2017    CMP     Component Value Date/Time   NA 143 04/09/2017 1300   NA 142 12/14/2016 1222   K 4.2 04/09/2017 1300   K 3.6 12/14/2016 1222   CL 109 04/09/2017 1300   CO2 24 04/09/2017 1300   CO2 23 12/14/2016 1222   GLUCOSE 94 04/09/2017 1300   GLUCOSE 96 12/14/2016 1222   BUN 15 04/09/2017 1300   BUN 10.3 12/14/2016 1222   CREATININE 0.97 04/09/2017 1300   CREATININE 1.0 12/14/2016 1222   CALCIUM 9.9 04/09/2017 1300   CALCIUM 9.1 12/14/2016 1222   PROT 6.9 04/09/2017 1300   PROT 6.8 12/14/2016 1222   ALBUMIN 3.4 (L) 04/09/2017 1300   ALBUMIN 3.3 (L) 12/14/2016 1222     AST 31 04/09/2017 1300   AST 15 12/14/2016 1222   ALT 27 04/09/2017 1300   ALT 6 12/14/2016 1222   ALKPHOS 101 04/09/2017 1300   ALKPHOS 100 12/14/2016 1222   BILITOT 1.3 (H) 04/09/2017 1300   BILITOT 0.78 12/14/2016 1222   GFRNONAA >60 04/09/2017 1300   GFRAA >60 04/09/2017 1300    No results found for: CEA1  Lab Results  Component Value Date   INR 1.09 12/08/2015    Imaging:  No results found.  Medications: I have reviewed the patient's current medications.   Assessment/Plan: 1. Pancreas cancer-presenting with obstructive jaundice  CT 08/18/2015 with intra-and extrahepatic Dillard dilatation and dilatation of the pancreas duct, vague fullness at the head of the pancreas  EUS 10/05/2015 confirmed a pancreas head mass with no evidence of vascular invasion and no peripancreatic adenopathy (uT2,uN0)  EUS biopsy of the pancreas head mass on 10/05/2015 confirmed adenocarcinoma  CT chest/abdomen at [facility] 11/16/2015-hypoenhancing mass in the head and uncinate process of the pancreas with increased markedpancreatic ductal dilatation and greater than 180 encasement of the superior mesenteric artery and superior mesenteric vein above the level of the first tributary.  Cycle 1 FOLFOX 12/12/2015  Cycle 2 FOLFOX 12/27/2015  Cycle 3 FOLFOX 01/24/2016-Neulasta added  Cycle 4 FOLFOX 02/14/2016  Restaging CT scans 03/02/2016-decreasing size of  pancreatic head/uncinate process mass with decreased encasement of the SMV (less than 180) and decreased pancreatic ductal dilatation. There remains greater than 180 of encasement of the SMA which is patent. Unchanged position of colon bile duct stent with decreasing right-sided pneumobilia. Stable 6 mm right upper lobe nodule.  Cycle 5 FOLFOX 03/13/2016 (oxaliplatin on hold due to poor tolerance)  Cycle 6 FOLFOX 03/27/2016 (oxaliplatin held)  Cycle 7 FOLFOX 04/10/2016 (oxaliplatin held)  Cycle 8 FOLFOX 04/24/2016 (oxaliplatin  held)  MRI of the abdomen at [facility] on 05/23/2016 showed unchanged pancreatic head mass as compared to 05/02/2016 CT with similar encasement of the SMA and abutment/encasement of the SMV. No evidence of metastatic disease in the upper abdomen.   He completed a course of radiation delivered over 5 fractions on 06/18/2016 at [facility].  MRI abdomen at [facility] 10/30/2016-mildincrease in size of the pancreas head mass with encasement of the SMA and SMV, no evidence of metastatic disease  CT chest, abdomen, pelvis 04/15/2017- mass at the medial pancreas head/uncinate 8 with encasement of the SMA, mild to moderate intrahepatic and extrahepatic ductal dilatation  CTs at [facility] 04/30/2017- slight increased size of the pancreas head mass, encasement of the SMA, increase compression of the portal confluence and narrowing/occlusion of the superior mesenteric vein, no new site of disease  2. Obstructive jaundice secondary to #1-status postplacement of a bile duct stent  3. Hypertension  4. Anorexia/weight loss secondary to #1  5. 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    Disposition: Dustin Lucas has pancreas cancer.  The CA 19-9 is higher.  There is slight enlargement of the pancreas head mass with increased vascular involvement.  No evidence of distant metastatic disease.  He is losing weight.  The overall clinical picture is of disease progression.  I discussed treatment options with Dustin Lucas and his wife.  We discussed observation/supportive care, single agent gemcitabine, and gemcitabine/Abraxane.  We also discussed referral to Dr. Aleatha Borer to consider enrollment on a clinical trial.  He would like to proceed with systemic chemotherapy.  I recommend gemcitabine/Abraxane to  be given every 2 weeks.  We reviewed the potential toxicities associated with this regimen including the chance for hematologic toxicity, alopecia, fever, rash, pneumonitis, and allergic reaction, and neuropathy.  Dustin Lucas will begin gemcitabine/Abraxane on 05/09/2017.  He plans to schedule a follow-up appointment with Dr. Aleatha Borer to discuss clinical trial options.  We will follow his clinical status and the CA 19-9 as markers of response.  40 minutes were spent with the patient today.  The majority of the time was used for counseling and coordination of care.  Betsy Coder, MD  05/01/2017  11:24 AM

## 2017-05-01 NOTE — Progress Notes (Signed)
DISCONTINUE ON PATHWAY REGIMEN - Pancreatic     A cycle is every 14 days:     Oxaliplatin        Dose Mod: None     Leucovorin        Dose Mod: None     Irinotecan        Dose Mod: None     5-Fluorouracil        Dose Mod: None     5-Fluorouracil        Dose Mod: None  **Always confirm dose/schedule in your pharmacy ordering system**    REASON: Disease Progression PRIOR TREATMENT: PANOS56: FOLFIRINOX q14 Days x 4 Cycles TREATMENT RESPONSE: Unable to Evaluate  START ON PATHWAY REGIMEN - Pancreatic     A cycle is every 28 days:     Nab-paclitaxel (protein bound)      Gemcitabine   **Always confirm dose/schedule in your pharmacy ordering system**    Patient Characteristics: Adenocarcinoma, Locally Advanced, Anatomically Unresectable, Second Line, MSS/pMMR or MSI Unknown Histology: Adenocarcinoma Current evidence of distant metastases<= No AJCC T Category: Staged < 8th Ed. AJCC N Category: Staged < 8th Ed. AJCC M Category: Staged < 8th Ed. AJCC 8 Stage Grouping: Staged < 8th Ed. Line of Therapy: Second Engineer, civil (consulting) Status: Unknown Intent of Therapy: Non-Curative / Palliative Intent, Discussed with Patient

## 2017-05-04 ENCOUNTER — Other Ambulatory Visit: Payer: Self-pay | Admitting: Oncology

## 2017-05-04 NOTE — Progress Notes (Unsigned)
  [facility] OFFICE PROGRESS NOTE   Diagnosis:   INTERVAL HISTORY:   ***  Objective:  Vital signs in last 24 hours:  There were no vitals taken for this visit.    HEENT: *** Lymphatics: *** Resp: *** Cardio: *** GI: *** Vascular: *** Neuro:***  Skin:***   Portacath/PICC-without erythema  Lab Results:  Lab Results  Component Value Date   WBC 5.2 04/09/2017   HGB 13.5 04/09/2017   HCT 39.4 04/09/2017   MCV 94.9 04/09/2017   PLT 133 (L) 04/09/2017   NEUTROABS 3.4 04/09/2017    CMP     Component Value Date/Time   NA 143 04/09/2017 1300   NA 142 12/14/2016 1222   K 4.2 04/09/2017 1300   K 3.6 12/14/2016 1222   CL 109 04/09/2017 1300   CO2 24 04/09/2017 1300   CO2 23 12/14/2016 1222   GLUCOSE 94 04/09/2017 1300   GLUCOSE 96 12/14/2016 1222   BUN 15 04/09/2017 1300   BUN 10.3 12/14/2016 1222   CREATININE 0.97 04/09/2017 1300   CREATININE 1.0 12/14/2016 1222   CALCIUM 9.9 04/09/2017 1300   CALCIUM 9.1 12/14/2016 1222   PROT 6.9 04/09/2017 1300   PROT 6.8 12/14/2016 1222   ALBUMIN 3.4 (L) 04/09/2017 1300   ALBUMIN 3.3 (L) 12/14/2016 1222   AST 31 04/09/2017 1300   AST 15 12/14/2016 1222   ALT 27 04/09/2017 1300   ALT 6 12/14/2016 1222   ALKPHOS 101 04/09/2017 1300   ALKPHOS 100 12/14/2016 1222   BILITOT 1.3 (H) 04/09/2017 1300   BILITOT 0.78 12/14/2016 1222   GFRNONAA >60 04/09/2017 1300   GFRAA >60 04/09/2017 1300    No results found for: CEA1  Lab Results  Component Value Date   INR 1.09 12/08/2015    Imaging:  No results found.  Medications: I have reviewed the patient's current medications.   Assessment/Plan:    Disposition: ***  Betsy Coder, MD  05/04/2017  8:25 PM

## 2017-05-09 ENCOUNTER — Inpatient Hospital Stay: Payer: Medicare Other

## 2017-05-09 DIAGNOSIS — C250 Malignant neoplasm of head of pancreas: Secondary | ICD-10-CM

## 2017-05-09 DIAGNOSIS — Z95828 Presence of other vascular implants and grafts: Secondary | ICD-10-CM

## 2017-05-09 LAB — CBC WITH DIFFERENTIAL (CANCER CENTER ONLY)
BASOS ABS: 0 10*3/uL (ref 0.0–0.1)
Basophils Relative: 0 %
EOS PCT: 0 %
Eosinophils Absolute: 0 10*3/uL (ref 0.0–0.5)
HEMATOCRIT: 37.4 % — AB (ref 38.4–49.9)
HEMOGLOBIN: 12.6 g/dL — AB (ref 13.0–17.1)
LYMPHS PCT: 11 %
Lymphs Abs: 0.6 10*3/uL — ABNORMAL LOW (ref 0.9–3.3)
MCH: 32.2 pg (ref 27.2–33.4)
MCHC: 33.7 g/dL (ref 32.0–36.0)
MCV: 95.4 fL (ref 79.3–98.0)
Monocytes Absolute: 0.6 10*3/uL (ref 0.1–0.9)
Monocytes Relative: 11 %
Neutro Abs: 4.5 10*3/uL (ref 1.5–6.5)
Neutrophils Relative %: 78 %
Platelet Count: 171 10*3/uL (ref 140–400)
RBC: 3.92 MIL/uL — ABNORMAL LOW (ref 4.20–5.82)
RDW: 14.1 % (ref 11.0–14.6)
WBC: 5.8 10*3/uL (ref 4.0–10.3)

## 2017-05-09 LAB — CMP (CANCER CENTER ONLY)
ALT: 74 U/L — ABNORMAL HIGH (ref 0–55)
ANION GAP: 8 (ref 3–11)
AST: 46 U/L — AB (ref 5–34)
Albumin: 2.5 g/dL — ABNORMAL LOW (ref 3.5–5.0)
Alkaline Phosphatase: 227 U/L — ABNORMAL HIGH (ref 40–150)
BILIRUBIN TOTAL: 5.1 mg/dL — AB (ref 0.2–1.2)
BUN: 15 mg/dL (ref 7–26)
CO2: 26 mmol/L (ref 22–29)
Calcium: 9.7 mg/dL (ref 8.4–10.4)
Chloride: 106 mmol/L (ref 98–109)
Creatinine: 0.8 mg/dL (ref 0.70–1.30)
GFR, Est AFR Am: >60 mL/min (ref >60)
Glucose, Bld: 101 mg/dL (ref 70–140)
Potassium: 3.5 mmol/L (ref 3.5–5.1)
Sodium: 140 mmol/L (ref 136–145)
TOTAL PROTEIN: 7.5 g/dL (ref 6.4–8.3)

## 2017-05-09 MED ORDER — SODIUM CHLORIDE 0.9% FLUSH
10.0000 mL | INTRAVENOUS | Status: DC | PRN
  Administered 2017-05-09: 10 mL via INTRAVENOUS
  Filled 2017-05-09: qty 10

## 2017-05-09 MED ORDER — HEPARIN SOD (PORK) LOCK FLUSH 100 UNIT/ML IV SOLN
500.0000 [IU] | Freq: Once | INTRAVENOUS | Status: AC | PRN
  Administered 2017-05-09: 500 [IU] via INTRAVENOUS
  Filled 2017-05-09: qty 5

## 2017-05-09 NOTE — Progress Notes (Signed)
Pt. not to receive chemo treatment today per Dr. Benay Spice, he will contact pt. tomorrow to discuss plan per Melia RN.

## 2017-05-10 ENCOUNTER — Other Ambulatory Visit: Payer: Self-pay | Admitting: Gastroenterology

## 2017-05-10 LAB — CANCER ANTIGEN 19-9: CAN 19-9: 7769 U/mL — AB (ref 0–35)

## 2017-05-13 ENCOUNTER — Other Ambulatory Visit: Payer: Self-pay | Admitting: *Deleted

## 2017-05-13 DIAGNOSIS — C250 Malignant neoplasm of head of pancreas: Secondary | ICD-10-CM

## 2017-05-13 MED ORDER — OXYCODONE-ACETAMINOPHEN 5-325 MG PO TABS: 0.5000 | ORAL_TABLET | ORAL | 0 refills | Status: AC | PRN

## 2017-05-13 NOTE — Telephone Encounter (Signed)
Message from pt requesting pain medication. Returned call, pt reports mid abdominal pain. Rated 7/10. Pt reports he has taken Tramadol in the past without relief. Noted he had taken Oxycodone, he reports he would take half tablet with results. Reviewed with Dr. Benay Spice. Order received.

## 2017-05-14 ENCOUNTER — Other Ambulatory Visit: Payer: Self-pay | Admitting: Gastroenterology

## 2017-05-15 ENCOUNTER — Ambulatory Visit (HOSPITAL_COMMUNITY): Payer: Medicare Other

## 2017-05-15 ENCOUNTER — Encounter (HOSPITAL_COMMUNITY)
Admission: RE | Disposition: A | Discharge status: Home / Self Care | Payer: Self-pay | Source: Ambulatory Visit | Attending: Gastroenterology

## 2017-05-15 ENCOUNTER — Encounter (HOSPITAL_COMMUNITY): Payer: Self-pay | Admitting: *Deleted

## 2017-05-15 ENCOUNTER — Ambulatory Visit (HOSPITAL_COMMUNITY): Payer: Medicare Other | Admitting: Registered Nurse

## 2017-05-15 ENCOUNTER — Ambulatory Visit (HOSPITAL_COMMUNITY)
Admission: RE | Admit: 2017-05-15 | Discharge: 2017-05-15 | Disposition: A | Discharge status: Home / Self Care | Payer: Medicare Other | Source: Ambulatory Visit | Attending: Gastroenterology | Admitting: Gastroenterology

## 2017-05-15 ENCOUNTER — Other Ambulatory Visit: Payer: Self-pay

## 2017-05-15 DIAGNOSIS — Z7982 Long term (current) use of aspirin: Secondary | ICD-10-CM | POA: Insufficient documentation

## 2017-05-15 DIAGNOSIS — G4730 Sleep apnea, unspecified: Secondary | ICD-10-CM | POA: Diagnosis not present

## 2017-05-15 DIAGNOSIS — C250 Malignant neoplasm of head of pancreas: Secondary | ICD-10-CM | POA: Diagnosis present

## 2017-05-15 DIAGNOSIS — I10 Essential (primary) hypertension: Secondary | ICD-10-CM | POA: Diagnosis not present

## 2017-05-15 DIAGNOSIS — Z79899 Other long term (current) drug therapy: Secondary | ICD-10-CM | POA: Insufficient documentation

## 2017-05-15 DIAGNOSIS — K831 Obstruction of bile duct: Secondary | ICD-10-CM | POA: Insufficient documentation

## 2017-05-15 DIAGNOSIS — R7989 Other specified abnormal findings of blood chemistry: Secondary | ICD-10-CM | POA: Insufficient documentation

## 2017-05-15 HISTORY — PX: ESOPHAGOGASTRODUODENOSCOPY: SHX5428

## 2017-05-15 HISTORY — PX: ENDOSCOPIC RETROGRADE CHOLANGIOPANCREATOGRAPHY (ERCP) WITH PROPOFOL: SHX5810

## 2017-05-15 SURGERY — ENDOSCOPIC RETROGRADE CHOLANGIOPANCREATOGRAPHY (ERCP) WITH PROPOFOL
Anesthesia: General

## 2017-05-15 MED ORDER — LIDOCAINE 2% (20 MG/ML) 5 ML SYRINGE
INTRAMUSCULAR | Status: DC | PRN
  Administered 2017-05-15: 80 mg via INTRAVENOUS

## 2017-05-15 MED ORDER — PROPOFOL 10 MG/ML IV BOLUS
INTRAVENOUS | Status: AC
  Filled 2017-05-15: qty 20

## 2017-05-15 MED ORDER — PROPOFOL 10 MG/ML IV BOLUS
INTRAVENOUS | Status: DC | PRN
  Administered 2017-05-15: 120 mg via INTRAVENOUS

## 2017-05-15 MED ORDER — INDOMETHACIN 50 MG RE SUPP
RECTAL | Status: AC
  Filled 2017-05-15: qty 2

## 2017-05-15 MED ORDER — FENTANYL CITRATE (PF) 100 MCG/2ML IJ SOLN
INTRAMUSCULAR | Status: AC
  Filled 2017-05-15: qty 2

## 2017-05-15 MED ORDER — ROCURONIUM BROMIDE 10 MG/ML (PF) SYRINGE
PREFILLED_SYRINGE | INTRAVENOUS | Status: DC | PRN
  Administered 2017-05-15: 40 mg via INTRAVENOUS

## 2017-05-15 MED ORDER — SODIUM CHLORIDE 0.9 % IV SOLN: INTRAVENOUS | Status: DC

## 2017-05-15 MED ORDER — ASPIRIN 81 MG PO CHEW: 81.0000 mg | CHEWABLE_TABLET | Freq: Every day | ORAL | Status: DC

## 2017-05-15 MED ORDER — VITAMIN D3 50 MCG (2000 UT) PO TABS: 2000.0000 [IU] | ORAL_TABLET | Freq: Every day | ORAL | Status: DC

## 2017-05-15 MED ORDER — BOOST PO LIQD: 237.0000 mL | Freq: Two times a day (BID) | ORAL | Status: DC

## 2017-05-15 MED ORDER — LOPERAMIDE HCL 2 MG PO TABS: 2.0000 mg | ORAL_TABLET | Freq: Four times a day (QID) | ORAL | Status: DC | PRN

## 2017-05-15 MED ORDER — DEXAMETHASONE SODIUM PHOSPHATE 10 MG/ML IJ SOLN
INTRAMUSCULAR | Status: DC | PRN
  Administered 2017-05-15: 10 mg via INTRAVENOUS

## 2017-05-15 MED ORDER — LACTATED RINGERS IV SOLN
INTRAVENOUS | Status: DC | PRN
  Administered 2017-05-15: 09:00:00 via INTRAVENOUS

## 2017-05-15 MED ORDER — SUGAMMADEX SODIUM 200 MG/2ML IV SOLN
INTRAVENOUS | Status: DC | PRN
  Administered 2017-05-15: 180 mg via INTRAVENOUS

## 2017-05-15 MED ORDER — PANTOPRAZOLE SODIUM 40 MG PO TBEC: 40.0000 mg | DELAYED_RELEASE_TABLET | Freq: Two times a day (BID) | ORAL | Status: DC

## 2017-05-15 MED ORDER — GLUCAGON HCL RDNA (DIAGNOSTIC) 1 MG IJ SOLR
INTRAMUSCULAR | Status: AC
  Filled 2017-05-15: qty 1

## 2017-05-15 MED ORDER — OXYCODONE-ACETAMINOPHEN 5-325 MG PO TABS
0.5000 | ORAL_TABLET | ORAL | Status: DC | PRN
Start: 2017-05-15 — End: 2017-05-15

## 2017-05-15 MED ORDER — ONDANSETRON HCL 4 MG/2ML IJ SOLN
INTRAMUSCULAR | Status: DC | PRN
  Administered 2017-05-15: 4 mg via INTRAVENOUS

## 2017-05-15 MED ORDER — FENTANYL CITRATE (PF) 100 MCG/2ML IJ SOLN
INTRAMUSCULAR | Status: DC | PRN
  Administered 2017-05-15: 50 ug via INTRAVENOUS

## 2017-05-15 MED ORDER — POTASSIUM 99 MG PO TABS: 99.0000 mg | ORAL_TABLET | Freq: Every day | ORAL | Status: DC

## 2017-05-15 MED ORDER — CIPROFLOXACIN IN D5W 400 MG/200ML IV SOLN
INTRAVENOUS | Status: AC
  Filled 2017-05-15: qty 200

## 2017-05-15 NOTE — Transfer of Care (Signed)
Immediate Anesthesia Transfer of Care Note  Patient: Dustin Lucas  Procedure(s) Performed: ENDOSCOPIC RETROGRADE CHOLANGIOPANCREATOGRAPHY (ERCP) WITH PROPOFOL (N/A )  Patient Location: PACU and Endoscopy Unit  Anesthesia Type:General  Level of Consciousness: awake, alert , oriented and patient cooperative  Airway & Oxygen Therapy: Patient Spontanous Breathing and Patient connected to face mask oxygen  Post-op Assessment: Report given to RN, Post -op Vital signs reviewed and stable and Patient moving all extremities  Post vital signs: Reviewed and stable  Last Vitals:  Vitals Value Taken Time  BP    Temp    Pulse    Resp 10 05/15/2017 10:13 AM  SpO2    Vitals shown include unvalidated device data.  Last Pain:  Vitals:   05/15/17 0832  TempSrc: Oral  PainSc: 0-No pain         Complications: No apparent anesthesia complications

## 2017-05-15 NOTE — Op Note (Signed)
[facility] Patient Name: Dustin Lucas Procedure Date: 05/15/2017 MRN: [id number] Attending MD: Ronnette Juniper , MD Date of Birth: 1940-06-09 CSN: [id number] Age: 77 Admit Type: Outpatient Procedure:                ERCP Indications:              Malignant tumor of the head of pancreas,obstructive                            jaundice for metallic stent placement Providers:                Ronnette Juniper, MD, Cleda Daub, RN, Janie Billups,                            Technician, Courtney Heys. Arm[place], CRNA Referring MD:              Medicines:                Monitored Anesthesia Care Complications:            No immediate complications. Estimated blood loss:                            Minimal. Estimated Blood Loss:     Estimated blood loss was minimal. Procedure:                Pre-Anesthesia Assessment:                           - Prior to the procedure, a History and Physical                            was performed, and patient medications and                            allergies were reviewed. The patient's tolerance of                            previous anesthesia was also reviewed. The risks                            and benefits of the procedure and the sedation                            options and risks were discussed with the patient.                            All questions were answered, and informed consent                            was obtained. Prior Anticoagulants: The patient has                            taken aspirin, last dose was 3 days prior to  procedure. ASA Grade Assessment: III - A patient                            with severe systemic disease. After reviewing the                            risks and benefits, the patient was deemed in                            satisfactory condition to undergo the procedure.                           After obtaining informed consent, the scope was                            passed under  direct vision. Throughout the                            procedure, the patient's blood pressure, pulse, and                            oxygen saturations were monitored continuously. The                            [id number] scope was introduced through the                            mouth, The procedure was aborted. The scope was not                            inserted. bile ducts Medications were stomach. The                            ERCP was technically difficult and complex due to                            abnormal anatomy. Successful completion of the                            procedure was aided by withdrawing the scope and                            replacing with the adult endoscope. The patient                            tolerated the procedure well. Scope In: Scope Out: Findings:      The scout film was normal. A benign-appearing, intrinsic severe stenosis       was found at the pylorus. This was non-traversed. Duodenoscope could not       be advanced through the pyloric channel in the duodenal bulb due to       severe inflammation, circumferential ulceration and stenosis of the       pyloric channel.      Adult gastroscope  was then use which was advanced into the duodenal bulb       with significant looping due to narrowing, ulceration and severe edema.      Due to underlying ulceration, friability,easy and significant oozing,       balloon dilatation was not performed.      Duodenoscope was reintroduced from the mouth and advanced up to the       pylorus, however, could not be advanced into the duodenal bulb.      The scope was then withdrawn and the procedure was terminated. Impression:               - Gastric stenosis was found at the pylorus. Moderate Sedation:      Patient did not receive moderate sedation for this procedure, but       instead received monitored anesthesia care. Recommendation:           - Refer to an interventional radiologist for                             possible internal and external stent placement at                            appointment to be scheduled.                           - Use Protonix (pantoprazole) 40 mg PO BID for 2                            months. Procedure Code(s):        --- Professional ---                           [id number], 52, Endoscopic retrograde                            cholangiopancreatography (ERCP); diagnostic,                            including collection of specimen(s) by brushing or                            washing, when performed (separate procedure) Diagnosis Code(s):        --- Professional ---                           K31.1, Adult hypertrophic pyloric stenosis                           C25.0, Malignant neoplasm of head of pancreas CPT copyright 2017 American [employer] Association. All rights reserved. The codes documented in this report are preliminary and upon coder review may  be revised to meet current compliance requirements. Ronnette Juniper, MD 05/15/2017 10:06:53 AM This report has been signed electronically. Number of Addenda: 0

## 2017-05-15 NOTE — Op Note (Signed)
ERCP was attempted for a metallic stent placement as patient has obstructive jaundice from pancreatic adenocarcinoma.   Findings: Severe stenosis noted at the pyloric channel with underlying inflammation, circumferential ulceration, edema. Duodenoscope could not be advanced into the duodenal bulb. The patient's position was changed from prone to left lateral. Scope was changed to an adult gastroscope. Adult gastroscope was advanced into the duodenal bulb with significant looping due to narrowing, ulceration and severe edema. Friability, easy and significant oozing was noted from underlying ulcerations or balloon dilatation was not performed. An attempt was made to reintroduce the side viewing duodenoscope into the pyloric channel and duodenal bulb, however due to underlying stenosis this could not be performed. The scope was then withdrawn and the procedure was terminated.   Recommendations: PPI twice a day for 2 months. IR consultation for evaluation for internal/external stent placement.  Ronnette Juniper, M.D.

## 2017-05-15 NOTE — Progress Notes (Signed)
Dr. Linna Caprice by to see Patient. Patient resting comfortable. SBP slightly increased 170/85. Dr. Linna Caprice aware.

## 2017-05-15 NOTE — Anesthesia Postprocedure Evaluation (Signed)
Anesthesia Post Note  Patient: Rudy Luhmann  Procedure(s) Performed: ENDOSCOPIC RETROGRADE CHOLANGIOPANCREATOGRAPHY (ERCP) WITH PROPOFOL (N/A ) ESOPHAGOGASTRODUODENOSCOPY (EGD) (N/A )     Patient location during evaluation: PACU Anesthesia Type: General Level of consciousness: awake and alert Pain management: pain level controlled Vital Signs Assessment: post-procedure vital signs reviewed and stable Respiratory status: spontaneous breathing, nonlabored ventilation, respiratory function stable and patient connected to nasal cannula oxygen Cardiovascular status: blood pressure returned to baseline and stable Postop Assessment: no apparent nausea or vomiting Anesthetic complications: no    Last Vitals:  Vitals:   05/15/17 1110 05/15/17 1150  BP: (!) 172/91 (!) 171/99  Pulse: 82 (!) 49  Resp: 12 14  Temp:    SpO2: 100% 95%    Last Pain:  Vitals:   05/15/17 1100  TempSrc:   PainSc: 0-No pain                 Elvan Ebron COKER

## 2017-05-15 NOTE — Anesthesia Procedure Notes (Signed)
Procedure Name: Intubation Date/Time: 05/15/2017 9:05 AM Performed by: Victoriano Lain, CRNA Pre-anesthesia Checklist: Patient identified, Emergency Drugs available, Patient being monitored, Suction available and Timeout performed Patient Re-evaluated:Patient Re-evaluated prior to induction Oxygen Delivery Method: Circle system utilized Preoxygenation: Pre-oxygenation with 100% oxygen Induction Type: IV induction Ventilation: Mask ventilation without difficulty Laryngoscope Size: Mac and 4 Grade View: Grade I Tube type: Oral Tube size: 7.5 mm Number of attempts: 1 Airway Equipment and Method: Stylet Placement Confirmation: ETT inserted through vocal cords under direct vision,  positive ETCO2 and breath sounds checked- equal and bilateral Secured at: 21 cm Tube secured with: Tape Dental Injury: Teeth and Oropharynx as per pre-operative assessment

## 2017-05-15 NOTE — Brief Op Note (Signed)
05/15/2017  10:07 AM  PATIENT:  Karren Cobble  77 y.o. male  PRE-OPERATIVE DIAGNOSIS:  Pancreatic Cancer; Jaundice  POST-OPERATIVE DIAGNOSIS:  * No post-op diagnosis entered *  PROCEDURE:  Procedure(s): ENDOSCOPIC RETROGRADE CHOLANGIOPANCREATOGRAPHY (ERCP) WITH PROPOFOL (N/A)  SURGEON:  Surgeon(s) and Role:    Ronnette Juniper, MD - Primary  PHYSICIAN ASSISTANT:   ASSISTANTS: Lucita Lora, RN, Janie Billups, Tech ANESTHESIA:   MAC  EBL:  Minimal  BLOOD ADMINISTERED:none  DRAINS: none   LOCAL MEDICATIONS USED:  NONE  SPECIMEN:  No Specimen  DISPOSITION OF SPECIMEN:  N/A  COUNTS:  YES  TOURNIQUET:  * No tourniquets in log *  DICTATION: .Dragon Dictation  PLAN OF CARE: Discharge to home after PACU  PATIENT DISPOSITION:  PACU - hemodynamically stable.   Delay start of Pharmacological VTE agent (>24hrs) due to surgical blood loss or risk of bleeding: no

## 2017-05-15 NOTE — Anesthesia Preprocedure Evaluation (Addendum)
Anesthesia Evaluation  Patient identified by MRN, date of birth, ID band Patient awake    Reviewed: Allergy & Precautions, NPO status , Patient's Chart, lab work & pertinent test results  Airway Mallampati: II  TM Distance: >3 FB Neck ROM: Full    Dental  (+) Edentulous Upper, Edentulous Lower   Pulmonary    breath sounds clear to auscultation       Cardiovascular hypertension,  Rhythm:Regular Rate:Normal     Neuro/Psych    GI/Hepatic   Endo/Other    Renal/GU      Musculoskeletal   Abdominal   Peds  Hematology   Anesthesia Other Findings   Reproductive/Obstetrics                             Anesthesia Physical Anesthesia Plan  ASA: III  Anesthesia Plan: General   Post-op Pain Management:    Induction: Intravenous  PONV Risk Score and Plan: Ondansetron and Dexamethasone  Airway Management Planned: Oral ETT  Additional Equipment:   Intra-op Plan:   Post-operative Plan: Extubation in OR  Informed Consent: I have reviewed the patients History and Physical, chart, labs and discussed the procedure including the risks, benefits and alternatives for the proposed anesthesia with the patient or authorized representative who has indicated his/her understanding and acceptance.     Plan Discussed with: CRNA and Anesthesiologist  Anesthesia Plan Comments:         Anesthesia Quick Evaluation

## 2017-05-15 NOTE — H&P (Signed)
History of present illness: 76 year old male with pancreatic cancer, considered inoperable, receiving radiation, followed by Dr. Benay Spice.He has pancreatic cancer. He was seen by our service in 2017 during the diagnosis. This included ERCP, EUS, fine-needle biopsy etc. 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 This was in the head of the pancreas and resulted in biliary obstruction. He had a stent placed.CT scan which showed progression and enlargement of the mass in the head of the pancreas with increased ductal dilatation. He had a CT 4/19 showing no metastatic disease to the chest. He has received FOLFOX chemotherapy. He apparently has been receiving his radiation and chemotherapy at [facility]. Recent labs reveal elevated CA 199 with enlargement of the pancreatic head mass with vascular involvement. Options of observation and supportive care or progressive chemotherapy possibly in a clinical trial were discussed. Her current chemotherapy is planned. His lab work obtained by Dr. Benay Spice reveal marked progression with his total bilirubin rising from 1.3 a month ago to 5.1. The patient notes that his urine has become progressively darker. He is not really having any abdominal pain but is having marked anorexia and is not eating very well.  Current Medications  Taking   Aspirin 81 MG Tablet 1 tablet Orally Once a day   Vitamin D3 2000 UNIT Capsule 1 capsule Orally Once a day   Discontinued   Exforge(Amlodipine Besylate-Valsartan) 5-320 MG Tablet 1 tablet Orally Once a day   Avodart(Dutasteride) 0.5 MG Capsule 1 capsule Orally Once a day   Folic Acid 631 MCG Tablet 1 tablet Orally Once a day   Tramadol HCl 50 MG Tablet 1 tablet as needed Orally every 6 hrs   Medication List reviewed and reconciled with the patient    Past Medical History  Asthma - as a child.   Artificial right hip.   Hypertension.   Sleep apnea.   Mitral valve prolapse.   elevated LFTS.    Surgical History  (2)  Rt hip replacement 2007, 2016  tonsillectomy   Lt. ankle surgery 2003  Rt. knee 1996  ERCP 08/2015  EUS- ERCP 10/2015   Family History  Father: deceased  Mother: deceased  no family hx colon cancer, colon polyps or liver disease.   Social History  General:  Tobacco use cigarettes: Never smoked. no EXPOSURE TO PASSIVE SMOKE. no Alcohol. no Recreational drug use. OCCUPATION: retired - .    Allergies  Shellfish: swelling - Allergy   Hospitalization/Major Diagnostic Procedure  jaundice, cholelithiasis 10/2015  not in the past year 5-19   Review of Systems  GI PROCEDURE:  no Pacemaker/ AICD, no. no Artificial heart valves. no MI/heart attack. Abnormal heart rhythm YES. no Angina. no CVA. Hypertension YES. no Hypotension. Asthma, COPD YES, Asthma. Sleep apnea YES. no Seizure disorders. Artificial joints YES, Right Hip. no Severe DJD. no Diabetes. no Significant headaches. no Vertigo. no Depression/anxiety. no Abnormal bleeding. no Kidney Disease. no Liver disease, no. Blood transfusion yes.     Assessment and plan: 1.Obstructive jaundice from pancreatic adenocarcinoma T bili 5.1/AST 46/AST 74/ALT 227 CA-19-9 7769 CT from 04/15/17 showed mild to moderate intrahepatic and extrahepatic ductal dilatation with pneumobilia, CBD 1.7 cm, prior placed stent no longer visualized. 2.42 cm soft tissue along the medial aspect of the pancreatic head/uncinate process encasing SMA and abutting the SMV.  ERCP with self-expanding metallic stent placement for palliation.  Ronnette Juniper, M.D.

## 2017-05-15 NOTE — Discharge Instructions (Signed)

## 2017-05-16 ENCOUNTER — Observation Stay (HOSPITAL_COMMUNITY)
Admission: AD | Admit: 2017-05-16 | Discharge: 2017-05-21 | Disposition: A | Discharge status: Home / Self Care | Payer: Medicare Other | Source: Ambulatory Visit | Attending: Internal Medicine | Admitting: Internal Medicine

## 2017-05-16 ENCOUNTER — Encounter (HOSPITAL_COMMUNITY): Payer: Self-pay | Admitting: Gastroenterology

## 2017-05-16 ENCOUNTER — Other Ambulatory Visit: Payer: Self-pay

## 2017-05-16 DIAGNOSIS — Z7982 Long term (current) use of aspirin: Secondary | ICD-10-CM | POA: Diagnosis not present

## 2017-05-16 DIAGNOSIS — Z96641 Presence of right artificial hip joint: Secondary | ICD-10-CM | POA: Diagnosis not present

## 2017-05-16 DIAGNOSIS — R7989 Other specified abnormal findings of blood chemistry: Secondary | ICD-10-CM | POA: Diagnosis present

## 2017-05-16 DIAGNOSIS — N390 Urinary tract infection, site not specified: Secondary | ICD-10-CM | POA: Insufficient documentation

## 2017-05-16 DIAGNOSIS — Z9889 Other specified postprocedural states: Secondary | ICD-10-CM | POA: Diagnosis not present

## 2017-05-16 DIAGNOSIS — K831 Obstruction of bile duct: Principal | ICD-10-CM | POA: Diagnosis present

## 2017-05-16 DIAGNOSIS — R509 Fever, unspecified: Secondary | ICD-10-CM

## 2017-05-16 DIAGNOSIS — B961 Klebsiella pneumoniae [K. pneumoniae] as the cause of diseases classified elsewhere: Secondary | ICD-10-CM | POA: Diagnosis not present

## 2017-05-16 DIAGNOSIS — R627 Adult failure to thrive: Secondary | ICD-10-CM | POA: Diagnosis not present

## 2017-05-16 DIAGNOSIS — N401 Enlarged prostate with lower urinary tract symptoms: Secondary | ICD-10-CM | POA: Insufficient documentation

## 2017-05-16 DIAGNOSIS — C250 Malignant neoplasm of head of pancreas: Secondary | ICD-10-CM | POA: Diagnosis not present

## 2017-05-16 DIAGNOSIS — I10 Essential (primary) hypertension: Secondary | ICD-10-CM | POA: Diagnosis present

## 2017-05-16 DIAGNOSIS — M1990 Unspecified osteoarthritis, unspecified site: Secondary | ICD-10-CM | POA: Diagnosis not present

## 2017-05-16 DIAGNOSIS — K7689 Other specified diseases of liver: Secondary | ICD-10-CM | POA: Diagnosis present

## 2017-05-16 DIAGNOSIS — Z8546 Personal history of malignant neoplasm of prostate: Secondary | ICD-10-CM | POA: Diagnosis not present

## 2017-05-16 DIAGNOSIS — I700 Atherosclerosis of aorta: Secondary | ICD-10-CM | POA: Insufficient documentation

## 2017-05-16 DIAGNOSIS — N138 Other obstructive and reflux uropathy: Secondary | ICD-10-CM | POA: Diagnosis not present

## 2017-05-16 DIAGNOSIS — R945 Abnormal results of liver function studies: Secondary | ICD-10-CM | POA: Diagnosis present

## 2017-05-16 DIAGNOSIS — Z91013 Allergy to seafood: Secondary | ICD-10-CM | POA: Diagnosis not present

## 2017-05-16 DIAGNOSIS — Z79899 Other long term (current) drug therapy: Secondary | ICD-10-CM | POA: Insufficient documentation

## 2017-05-16 DIAGNOSIS — Z95828 Presence of other vascular implants and grafts: Secondary | ICD-10-CM

## 2017-05-16 DIAGNOSIS — E43 Unspecified severe protein-calorie malnutrition: Secondary | ICD-10-CM | POA: Insufficient documentation

## 2017-05-16 DIAGNOSIS — C801 Malignant (primary) neoplasm, unspecified: Secondary | ICD-10-CM

## 2017-05-16 DIAGNOSIS — Z681 Body mass index (BMI) 19 or less, adult: Secondary | ICD-10-CM | POA: Diagnosis not present

## 2017-05-16 MED ORDER — OXYCODONE-ACETAMINOPHEN 5-325 MG PO TABS
0.5000 | ORAL_TABLET | ORAL | Status: DC | PRN
  Administered 2017-05-17 – 2017-05-21 (×7): 1 via ORAL
  Filled 2017-05-16 (×7): qty 1

## 2017-05-16 MED ORDER — ACETAMINOPHEN 325 MG PO TABS
650.0000 mg | ORAL_TABLET | Freq: Four times a day (QID) | ORAL | Status: DC | PRN
  Administered 2017-05-17: 650 mg via ORAL
  Filled 2017-05-16: qty 2

## 2017-05-16 MED ORDER — SODIUM CHLORIDE 0.9 % IV SOLN
INTRAVENOUS | Status: AC
  Administered 2017-05-16 – 2017-05-17 (×2): via INTRAVENOUS

## 2017-05-16 MED ORDER — FAMOTIDINE IN NACL 20-0.9 MG/50ML-% IV SOLN
20.0000 mg | Freq: Two times a day (BID) | INTRAVENOUS | Status: DC
  Administered 2017-05-16 – 2017-05-19 (×7): 20 mg via INTRAVENOUS
  Filled 2017-05-16 (×7): qty 50

## 2017-05-16 MED ORDER — VITAMIN D3 50 MCG (2000 UT) PO TABS: 2000.0000 [IU] | ORAL_TABLET | Freq: Every day | ORAL | Status: DC

## 2017-05-16 MED ORDER — ACETAMINOPHEN 650 MG RE SUPP: 650.0000 mg | Freq: Four times a day (QID) | RECTAL | Status: DC | PRN

## 2017-05-16 MED ORDER — ONDANSETRON HCL 4 MG PO TABS
4.0000 mg | ORAL_TABLET | Freq: Four times a day (QID) | ORAL | Status: DC | PRN
  Administered 2017-05-20: 4 mg via ORAL
  Filled 2017-05-16: qty 1

## 2017-05-16 MED ORDER — LOPERAMIDE HCL 2 MG PO CAPS
2.0000 mg | ORAL_CAPSULE | Freq: Four times a day (QID) | ORAL | Status: DC | PRN
  Administered 2017-05-18 (×2): 2 mg via ORAL
  Filled 2017-05-16 (×2): qty 1

## 2017-05-16 MED ORDER — OXYCODONE HCL 5 MG PO TABS: 5.0000 mg | ORAL_TABLET | ORAL | Status: DC | PRN

## 2017-05-16 MED ORDER — ENSURE ENLIVE PO LIQD
237.0000 mL | Freq: Two times a day (BID) | ORAL | Status: DC
  Administered 2017-05-16 – 2017-05-17 (×2): 237 mL via ORAL

## 2017-05-16 MED ORDER — POLYETHYLENE GLYCOL 3350 17 G PO PACK: 17.0000 g | PACK | Freq: Every day | ORAL | Status: DC | PRN

## 2017-05-16 MED ORDER — ONDANSETRON HCL 4 MG/2ML IJ SOLN: 4.0000 mg | Freq: Four times a day (QID) | INTRAMUSCULAR | Status: DC | PRN

## 2017-05-16 NOTE — Progress Notes (Signed)
Initial Nutrition Assessment/Consult  Nutrition Brief Note  Patient consult put in and pt identified on the Malnutrition Screening Tool (MST) Report; spoke with nurse secretary on 3W who reports pt will not be admitted into hospital anymore.   77 y.o. M with pancreatic cancer undergoing ChRT at [facility] admitted on 05/16/17. PMH of HTN, urinary frequency, arthritis, asthma.   Pt last admission (discharged): 05/15/17 Per MD note pt is not a surgical candidate for removal of tumor.  Admitted for Obstructive jaundice from pancreatic adenocarcinoma: ERCP with self-expanding metallic stent placement for palliation completed 05/15/17 am.  Using measured weight on 01/22/17 (82.2 kg) and last measured weight on 05/01/17 (69.6 kg), pt lost 15% of body weight in < 6 months - significant.   If patient is admitted, please re-consult RD.   Hope Budds, Dietetic Intern

## 2017-05-16 NOTE — H&P (Addendum)
History and Physical    Dustin Lucas GDJ:[id number] DOB: 1940-03-17 DOA: 05/16/2017  PCP: Benito Mccreedy, MD   Patient coming from: home   Chief Complaint: cholestasis, bilary obstruction  HPI: Dustin Lucas is a 77 y.o. male with medical history significant of pancreatic adenocarcinoma of the pancreatic head complicated by encasement of the SMA and biliary obstruction status post ERCP and biliary stent placed on 10/05/2015, on FOLFOX chemotherapy and radiation at [facility] who was admitted on 05/15/2017 for placement of another biliary stent due to obstructive jaundice and presumed migration of initial biliary stent.  Per discussion with patient's gastroenterologist Dr. Therisa Doyne patient was noted to have severe stenosis of the pyloric channel with underlying inflammation circumferential ulceration and bleeding.  She was unable to advance the gastroscope to the edema.  Dr. Hulen Skains from interventional radiology was called for consideration of external biliary drain and patient is being admitted for this for observation.  He currently reports some diffuse abdominal pain and significant anorexia but denies any fevers, cough, congestion, nausea, vomiting, diarrhea.  He does have significant early satiety.  ED Course: No ED course  Review of Systems: As per HPI otherwise 10 point review of systems negative.    Past Medical History:  Diagnosis Date  . Arthritis   . Asthma    as a child   . Cancer ([place]) 7-8 yrs ago   prostate cancer, monitoring psa levels q 6 months; pancreatic cancer   . Hx of radiation therapy   . Hypertension   . PONV (postoperative nausea and vomiting)    in past , none recent  . Urinary frequency     Past Surgical History:  Procedure Laterality Date  . ERCP N/A 08/21/2015   Procedure: ENDOSCOPIC RETROGRADE CHOLANGIOPANCREATOGRAPHY (ERCP);  Surgeon: Clarene Essex, MD;  Location: [facility];  Service: Endoscopy;  Laterality: N/A;  . ERCP N/A 08/20/2015   Procedure: ENDOSCOPIC  RETROGRADE CHOLANGIOPANCREATOGRAPHY (ERCP);  Surgeon: Carol Ada, MD;  Location: [facility];  Service: Endoscopy;  Laterality: N/A;  . ERCP N/A 10/05/2015   Procedure: ENDOSCOPIC RETROGRADE CHOLANGIOPANCREATOGRAPHY (ERCP);  Surgeon: Arta Silence, MD;  Location: [facility];  Service: Endoscopy;  Laterality: N/A;  Dr. Watt Climes to do ERCP  . ESOPHAGOGASTRODUODENOSCOPY (EGD) WITH PROPOFOL N/A 08/29/2015   Procedure: ESOPHAGOGASTRODUODENOSCOPY (EGD) WITH PROPOFOL;  Surgeon: Arta Silence, MD;  Location: [facility];  Service: Endoscopy;  Laterality: N/A;  . EUS N/A 10/05/2015   Procedure: UPPER ENDOSCOPIC ULTRASOUND (EUS) RADIAL;  Surgeon: Arta Silence, MD;  Location: [facility];  Service: Endoscopy;  Laterality: N/A;  . FRACTURE SURGERY Left    plate in ankle  . IR GENERIC HISTORICAL  12/08/2015   IR US GUIDE VASC ACCESS RIGHT 12/08/2015 Corrie Mckusick, DO [facility]  . IR GENERIC HISTORICAL  12/08/2015   IR FLUORO GUIDE PORT INSERTION RIGHT 12/08/2015 Corrie Mckusick, DO [facility]  . JOINT REPLACEMENT     hip replacement  right  . TONSILLECTOMY    . TOTAL HIP REVISION Right 08/09/2014   Procedure: TOTAL HIP REVISION;  Surgeon: Frederik Pear, MD;  Location: [place];  Service: Orthopedics;  Laterality: Right;  REVISION (ASR) RIGHT TOTAL HIP ARHROPLASTY**INNOMET OSTEOTONES  . TRANSURETHRAL RESECTION OF PROSTATE N/A 12/18/2016   Procedure: TRANSURETHRAL RESECTION OF THE PROSTATE (TURP);  Surgeon: Irine Seal, MD;  Location: [facility];  Service: Urology;  Laterality: N/A;  . UPPER ESOPHAGEAL ENDOSCOPIC ULTRASOUND (EUS) N/A 08/29/2015   Procedure: UPPER ESOPHAGEAL ENDOSCOPIC ULTRASOUND (EUS);  Surgeon: Arta Silence, MD;  Location: [facility];  Service: Endoscopy;  Laterality: N/A;     reports that he has never smoked. He has never used smokeless tobacco. He reports that he does not drink alcohol or use drugs.  Allergies  Allergen Reactions  . Shellfish Allergy Itching    No  contributory family history  Prior to Admission medications   Medication Sig Start Date End Date Taking? Authorizing Provider  aspirin 81 MG chewable tablet Chew 1 tablet (81 mg total) by mouth daily. Start taking 08/29/2015 08/29/15   Theodis Blaze, MD  Cholecalciferol (VITAMIN D3) 2000 units TABS Take 2,000 Units by mouth daily.    [provider]  lactose free nutrition (BOOST) LIQD Take 237 mLs by mouth 2 (two) times daily between meals.    [provider]  lidocaine-prilocaine (EMLA) cream Apply to portacath site 1 hour prior to use Patient not taking: Reported on 05/13/2017 11/29/15   Owens Shark, NP  loperamide (IMODIUM A-D) 2 MG tablet Take 2 mg by mouth 4 (four) times daily as needed for diarrhea or loose stools.    [provider]  oxyCODONE-acetaminophen (PERCOCET/ROXICET) 5-325 MG tablet Take 0.5-1 tablets by mouth every 4 (four) hours as needed for severe pain. 05/13/17   Owens Shark, NP  Potassium 99 MG TABS Take 99 mg by mouth daily.    [provider]    Physical Exam: There were no vitals filed for this visit.  Constitutional: NAD, calm, comfortable There were no vitals filed for this visit. Eyes: icteric sclera ENMT: Moist mucous membranes, poor dentition Neck: normal, supple, no masses, no thyromegaly Respiratory: clear to auscultation bilaterally, no wheezing, no crackles. Normal respiratory effort. No accessory muscle use.  Cardiovascular: Regular rate and rhythm, no murmurs Abdomen: Mildly tender to deep palpation epigastric, no rebound or guarding, plus bowel sounds Musculoskeletal: No lower extremity edema.  Skin: Port site is clean dry and intact Neurologic: Grossly intact Psychiatric: Normal judgment and insight. Alert and oriented x 3. Normal mood.     Labs on Admission: I have personally reviewed following labs and imaging studies  CBC: Recent Labs  Lab 05/09/17 1155  WBC 5.8  NEUTROABS 4.5  HGB 12.6*  HCT  37.4*  MCV 95.4  PLT 161   Basic Metabolic Panel: Recent Labs  Lab 05/09/17 1155  NA 140  K 3.5  CL 106  CO2 26  GLUCOSE 101  BUN 15  CREATININE 0.80  CALCIUM 9.7   GFR: Estimated Creatinine Clearance: 75.6 mL/min (by C-G formula based on SCr of 0.8 mg/dL). Liver Function Tests: Recent Labs  Lab 05/09/17 1155  AST 46*  ALT 74*  ALKPHOS 227*  BILITOT 5.1*  PROT 7.5  ALBUMIN 2.5*   No results for input(s): LIPASE, AMYLASE in the last 168 hours. No results for input(s): AMMONIA in the last 168 hours. Coagulation Profile: No results for input(s): INR, PROTIME in the last 168 hours. Cardiac Enzymes: No results for input(s): CKTOTAL, CKMB, CKMBINDEX, TROPONINI in the last 168 hours. BNP (last 3 results) No results for input(s): PROBNP in the last 8760 hours. HbA1C: No results for input(s): HGBA1C in the last 72 hours. CBG: No results for input(s): GLUCAP in the last 168 hours. Lipid Profile: No results for input(s): CHOL, HDL, LDLCALC, TRIG, CHOLHDL, LDLDIRECT in the last 72 hours. Thyroid Function Tests: No results for input(s): TSH, T4TOTAL, FREET4, T3FREE, THYROIDAB in the last 72 hours. Anemia Panel: No results for input(s): VITAMINB12, FOLATE, FERRITIN, TIBC, IRON, RETICCTPCT in the  last 72 hours. Urine analysis:    Component Value Date/Time   COLORURINE RED (A) 08/19/2015 0126   APPEARANCEUR CLOUDY (A) 08/19/2015 0126   LABSPEC 1.010 03/15/2016 1513   PHURINE 6.5 03/15/2016 1513   PHURINE 6.0 08/19/2015 0126   GLUCOSEU Negative 03/15/2016 1513   HGBUR Small 03/15/2016 1513   HGBUR NEGATIVE 08/19/2015 0126   BILIRUBINUR Negative 03/15/2016 1513   KETONESUR Negative 03/15/2016 1513   KETONESUR NEGATIVE 08/19/2015 0126   PROTEINUR 30 03/15/2016 1513   PROTEINUR 30 (A) 08/19/2015 0126   UROBILINOGEN 0.2 03/15/2016 1513   NITRITE Negative 03/15/2016 1513   NITRITE POSITIVE (A) 08/19/2015 0126   LEUKOCYTESUR Moderate 03/15/2016 1513    Radiological  Exams on Admission: No results found.  EKG: Independently reviewed. None preformed  Assessment/Plan Active Problems:   Abnormal LFTs   Essential hypertension, benign   Primary cancer of head of pancreas ([place])   Port catheter in place   BPH with obstruction/lower urinary tract symptoms   Cholestasis    #) Obstructive jaundice due to pancreatic cancer: Secondary to acute cancer and migration of prior biliary stent.  Unable to place a stent due to due to severe stenosis of pyloric channel. Patient apparently can eat and drink though he has lost quite a bit of weight.  He at this time does not have any signs of ascending cholangitis including a normal white count no fevers.  He does have chronic abdominal pain. -N.p.o. pending IR placement of external biliary drain  #) Cholestatic pattern of liver injury: Patient has only mild LFT elevations.  His cholestatic pattern of liver injury is likely related to physical obstruction. -Monitor  #) Failure to thrive/pyloric obstruction: Per discussion with Dr. Therisa Doyne patient was noted to have quite friable and bleeding mass with significant ulceration.  Patient is also had fairly frequent abdominal fullness and significant weight loss over the past several months.  His wife is wondering whether feeding tube would improve his quality of life or at least improve his course.  Discussed with Dr. Ammie Dalton who reported that he would discuss with the family tomorrow. -Nutrition consult - Defer discussion to Dr. Ammie Dalton -IV antiacid twice daily, transition to p.o. when discharged  #) Hypertension: - Not on any treatment at this time - d/c aspirin 81 mg   Fluids: Tolerating p.o. Elect lites: Monitor and supplement Nutrition: Regular diet  Prophylaxis: SCDs  Disposition: Pending observation overnight after external biliary drain  Full code    Cristy Folks MD [facility]   If 7PM-7AM, please contact  night-coverage [email or web address] Password [email or web address]  05/16/2017, 11:30 AM

## 2017-05-16 NOTE — Progress Notes (Signed)
Referring Physician(s): Karki,A/Sherrill,B  Supervising Physician: Pilar Jarvis  Patient Status:  [facility] - In-pt  Chief Complaint:  Obstructive jaundice, pancreatic cancer  Subjective: Patient familiar to IR service from prior Port-A-Cath placement on 12/08/2015.  He said a known history of pancreatic cancer since 2017, status post chemoradiation.  He had a distal common bile duct stent placed on 10/05/2015 which is not visualized on recent imaging.  He now presents with worsening liver function tests, elevated CA-19-9, progressive wt loss, anorexia, diminished appetite and CT findings of pancreatic head/uncinate mass suspicious for residual/recurrent tumor.  Tumor encases SMA and abuts the SMV and there is also associated moderate intrahepatic and extrahepatic ductal dilatation.  ERCP with metal stent placement was attempted yesterday, however unsuccessful secondary to severe stenosis of the pyloric channel with underlying inflammation, circumferential ulceration and edema.  The duodenoscope could not be advanced into the duodenal bulb.  Request now received for PTC with biliary drain placement. He currently denies fever,HA,CP,dyspnea, cough, abd/back pain,N/V or bleeding.   Past Medical History:  Diagnosis Date  . Arthritis   . Asthma    as a child   . Cancer ([place]) 7-8 yrs ago   prostate cancer, monitoring psa levels q 6 months; pancreatic cancer   . Hx of radiation therapy   . Hypertension   . PONV (postoperative nausea and vomiting)    in past , none recent  . Urinary frequency    Past Surgical History:  Procedure Laterality Date  . ERCP N/A 08/21/2015   Procedure: ENDOSCOPIC RETROGRADE CHOLANGIOPANCREATOGRAPHY (ERCP);  Surgeon: Clarene Essex, MD;  Location: [facility];  Service: Endoscopy;  Laterality: N/A;  . ERCP N/A 08/20/2015   Procedure: ENDOSCOPIC RETROGRADE CHOLANGIOPANCREATOGRAPHY (ERCP);  Surgeon: Carol Ada, MD;  Location: [facility];  Service: Endoscopy;  Laterality:  N/A;  . ERCP N/A 10/05/2015   Procedure: ENDOSCOPIC RETROGRADE CHOLANGIOPANCREATOGRAPHY (ERCP);  Surgeon: Arta Silence, MD;  Location: [facility];  Service: Endoscopy;  Laterality: N/A;  Dr. Watt Climes to do ERCP  . ESOPHAGOGASTRODUODENOSCOPY (EGD) WITH PROPOFOL N/A 08/29/2015   Procedure: ESOPHAGOGASTRODUODENOSCOPY (EGD) WITH PROPOFOL;  Surgeon: Arta Silence, MD;  [facility]: [facility];  Service: Endoscopy;  Laterality: N/A;  . EUS N/A 10/05/2015   Procedure: UPPER ENDOSCOPIC ULTRASOUND (EUS) RADIAL;  Surgeon: Arta Silence, MD;  Location: [facility];  Service: Endoscopy;  Laterality: N/A;  . FRACTURE SURGERY Left    plate in ankle  . IR GENERIC HISTORICAL  12/08/2015   IR US GUIDE VASC ACCESS RIGHT 12/08/2015 Corrie Mckusick, DO [facility]  . IR GENERIC HISTORICAL  12/08/2015   IR FLUORO GUIDE PORT INSERTION RIGHT 12/08/2015 Corrie Mckusick, DO [facility]  . JOINT REPLACEMENT     hip replacement  right  . TONSILLECTOMY    . TOTAL HIP REVISION Right 08/09/2014   Procedure: TOTAL HIP REVISION;  Surgeon: Frederik Pear, MD;  Location: [place];  Service: Orthopedics;  Laterality: Right;  REVISION (ASR) RIGHT TOTAL HIP ARHROPLASTY**INNOMET OSTEOTONES  . TRANSURETHRAL RESECTION OF PROSTATE N/A 12/18/2016   Procedure: TRANSURETHRAL RESECTION OF THE PROSTATE (TURP);  Surgeon: Irine Seal, MD;  Location: [facility];  Service: Urology;  Laterality: N/A;  . UPPER ESOPHAGEAL ENDOSCOPIC ULTRASOUND (EUS) N/A 08/29/2015   Procedure: UPPER ESOPHAGEAL ENDOSCOPIC ULTRASOUND (EUS);  Surgeon: Arta Silence, MD;  Location: [facility];  Service: Endoscopy;  Laterality: N/A;     Allergies: Shellfish allergy  Medications: Prior to Admission medications   Medication Sig Start Date End Date Taking? Authorizing Provider  aspirin 81  MG chewable tablet Chew 1 tablet (81 mg total) by mouth daily. Start taking 08/29/2015 08/29/15   Theodis Blaze, MD  Cholecalciferol (VITAMIN D3) 2000 units TABS Take 2,000  Units by mouth daily.    [provider]  lactose free nutrition (BOOST) LIQD Take 237 mLs by mouth 2 (two) times daily between meals.    [provider]  lidocaine-prilocaine (EMLA) cream Apply to portacath site 1 hour prior to use Patient not taking: Reported on 05/13/2017 11/29/15   Owens Shark, NP  loperamide (IMODIUM A-D) 2 MG tablet Take 2 mg by mouth 4 (four) times daily as needed for diarrhea or loose stools.    [provider]  oxyCODONE-acetaminophen (PERCOCET/ROXICET) 5-325 MG tablet Take 0.5-1 tablets by mouth every 4 (four) hours as needed for severe pain. 05/13/17   Owens Shark, NP  Potassium 99 MG TABS Take 99 mg by mouth daily.    [provider]     Vital Signs: Vitals:   05/16/17 2036 05/17/17 0414  BP: 121/79 (!) 144/72  Pulse: 82 78  Resp: 19 13  Temp: 99.5 F (37.5 C) 99.1 F (37.3 C)  SpO2: 99% 99%      Physical Exam awake/alert; scleral icterus; chest- CTA bilat; clean, intact rt chest PAC; heart- RRR; abd- soft,+BS,NT; no LE edema  Imaging: No results found.  Labs:  CBC: Recent Labs    12/14/16 1222 04/09/17 1300 05/09/17 1155  WBC 4.0 5.2 5.8  HGB 13.0 13.5 12.6*  HCT 38.9 39.4 37.4*  PLT 159 133* 171    COAGS: No results for input(s): INR, APTT in the last 8760 hours.  BMP: Recent Labs    12/14/16 1222 04/09/17 1300 05/09/17 1155  NA 142 143 140  K 3.6 4.2 3.5  CL  --  109 106  CO2 23 24 26   GLUCOSE 96 94 101  BUN 10.3 15 15   CALCIUM 9.1 9.9 9.7  CREATININE 1.0 0.97 0.80  GFRNONAA  --  >60 >60  GFRAA  --  >60 >60    LIVER FUNCTION TESTS: Recent Labs    12/14/16 1222 04/09/17 1300 05/09/17 1155  BILITOT 0.78 1.3* 5.1*  AST 15 31 46*  ALT 6 27 74*  ALKPHOS 100 101 227*  PROT 6.8 6.9 7.5  ALBUMIN 3.3* 3.4* 2.5*    Assessment and Plan: Patient with history of pancreatic cancer since 2017, status post chemoradiation.  Now with worsening LFTs, obstructive jaundice, residual  recurrent pancreatic tumor on recent CT with encasement of the SMA and abutment of the SMV along with moderate intrahepatic and extrahepatic ductal dilatation.  ERCP with metal stent placement unsuccessful on 05/15/17.  Request received for PTC with biliary drain placement. Imaging studies were reviewed by Dr. Kathlene Cote.  Details/risks of procedure, including but not limited to, internal bleeding, infection, need for compliance was perfused, discussed with patient with his understanding and consent.  Procedure scheduled for today 5/17.   Electronically Signed: D. Rowe Robert, PA-C 05/16/2017, 11:27 AM   I spent a total of 30 minutes at the the patient's bedside AND on the patient's hospital floor or unit, greater than 50% of which was counseling/coordinating care for percutaneous transhepatic cholangiogram with biliary drain placement    Patient ID: Dustin Lucas, male   DOB: 1940-08-26, 77 y.o.   MRN: [id number]

## 2017-05-17 ENCOUNTER — Inpatient Hospital Stay (HOSPITAL_COMMUNITY): Payer: Medicare Other

## 2017-05-17 ENCOUNTER — Encounter (HOSPITAL_COMMUNITY): Payer: Self-pay | Admitting: Interventional Radiology

## 2017-05-17 DIAGNOSIS — C801 Malignant (primary) neoplasm, unspecified: Secondary | ICD-10-CM | POA: Diagnosis not present

## 2017-05-17 DIAGNOSIS — R630 Anorexia: Secondary | ICD-10-CM | POA: Diagnosis not present

## 2017-05-17 DIAGNOSIS — N138 Other obstructive and reflux uropathy: Secondary | ICD-10-CM | POA: Diagnosis not present

## 2017-05-17 DIAGNOSIS — R945 Abnormal results of liver function studies: Secondary | ICD-10-CM

## 2017-05-17 DIAGNOSIS — E43 Unspecified severe protein-calorie malnutrition: Secondary | ICD-10-CM

## 2017-05-17 DIAGNOSIS — K831 Obstruction of bile duct: Secondary | ICD-10-CM

## 2017-05-17 DIAGNOSIS — D709 Neutropenia, unspecified: Secondary | ICD-10-CM

## 2017-05-17 DIAGNOSIS — Z681 Body mass index (BMI) 19 or less, adult: Secondary | ICD-10-CM | POA: Diagnosis not present

## 2017-05-17 DIAGNOSIS — N401 Enlarged prostate with lower urinary tract symptoms: Secondary | ICD-10-CM

## 2017-05-17 DIAGNOSIS — I10 Essential (primary) hypertension: Secondary | ICD-10-CM

## 2017-05-17 DIAGNOSIS — C250 Malignant neoplasm of head of pancreas: Secondary | ICD-10-CM

## 2017-05-17 DIAGNOSIS — R200 Anesthesia of skin: Secondary | ICD-10-CM

## 2017-05-17 DIAGNOSIS — N390 Urinary tract infection, site not specified: Secondary | ICD-10-CM | POA: Diagnosis not present

## 2017-05-17 HISTORY — PX: IR INT EXT BILIARY DRAIN WITH CHOLANGIOGRAM: IMG6044

## 2017-05-17 LAB — COMPREHENSIVE METABOLIC PANEL
ALT: 84 U/L — ABNORMAL HIGH (ref 17–63)
AST: 99 U/L — ABNORMAL HIGH (ref 15–41)
Alkaline Phosphatase: 161 U/L — ABNORMAL HIGH (ref 38–126)
BUN: 14 mg/dL (ref 6–20)
CO2: 23 mmol/L (ref 22–32)
Calcium: 8.5 mg/dL — ABNORMAL LOW (ref 8.9–10.3)
Chloride: 107 mmol/L (ref 101–111)
Creatinine, Ser: 0.65 mg/dL (ref 0.61–1.24)
GFR calc Af Amer: >60 mL/min (ref >60)
GFR calc non Af Amer: >60 mL/min (ref >60)

## 2017-05-17 LAB — CBC WITH DIFFERENTIAL/PLATELET
BASOS ABS: 0 10*3/uL (ref 0.0–0.1)
Basophils Relative: 0 %
Eosinophils Absolute: 0 10*3/uL (ref 0.0–0.7)
Eosinophils Relative: 0 %
HEMATOCRIT: 29.9 % — AB (ref 39.0–52.0)
HEMOGLOBIN: 10.4 g/dL — AB (ref 13.0–17.0)
LYMPHS PCT: 17 %
Lymphs Abs: 0.9 10*3/uL (ref 0.7–4.0)
MCH: 31.7 pg (ref 26.0–34.0)
MCHC: 34.8 g/dL (ref 30.0–36.0)
MCV: 91.2 fL (ref 78.0–100.0)
MONO ABS: 0.5 10*3/uL (ref 0.1–1.0)
Monocytes Relative: 9 %
NEUTROS ABS: 4 10*3/uL (ref 1.7–7.7)
NEUTROS PCT: 74 %
Platelets: 218 10*3/uL (ref 150–400)
RBC: 3.28 MIL/uL — AB (ref 4.22–5.81)
RDW: 15.5 % (ref 11.5–15.5)
WBC: 5.4 10*3/uL (ref 4.0–10.5)

## 2017-05-17 LAB — COMPREHENSIVE METABOLIC PANEL WITH GFR
Albumin: 2 g/dL — ABNORMAL LOW (ref 3.5–5.0)
Anion gap: 10 (ref 5–15)
Glucose, Bld: 112 mg/dL — ABNORMAL HIGH (ref 65–99)
Potassium: 3.6 mmol/L (ref 3.5–5.1)
Sodium: 140 mmol/L (ref 135–145)
Total Bilirubin: 11 mg/dL — ABNORMAL HIGH (ref 0.3–1.2)
Total Protein: 6.4 g/dL — ABNORMAL LOW (ref 6.5–8.1)

## 2017-05-17 LAB — PROTIME-INR
INR: 1.33
Prothrombin Time: 16.4 seconds — ABNORMAL HIGH (ref 11.4–15.2)

## 2017-05-17 MED ORDER — PIPERACILLIN-TAZOBACTAM 3.375 G IVPB
INTRAVENOUS | Status: AC
  Administered 2017-05-17: 3.375 g via INTRAVENOUS
  Filled 2017-05-17: qty 50

## 2017-05-17 MED ORDER — MIDAZOLAM HCL 2 MG/2ML IJ SOLN
INTRAMUSCULAR | Status: AC
  Filled 2017-05-17: qty 6

## 2017-05-17 MED ORDER — PIPERACILLIN-TAZOBACTAM 3.375 G IVPB
3.3750 g | INTRAVENOUS | Status: AC
  Administered 2017-05-17: 3.375 g via INTRAVENOUS

## 2017-05-17 MED ORDER — MIDAZOLAM HCL 2 MG/2ML IJ SOLN
INTRAMUSCULAR | Status: AC | PRN
  Administered 2017-05-17 (×2): 1 mg via INTRAVENOUS

## 2017-05-17 MED ORDER — LIDOCAINE HCL 1 % IJ SOLN
INTRAMUSCULAR | Status: AC
  Filled 2017-05-17: qty 20

## 2017-05-17 MED ORDER — LIDOCAINE HCL (PF) 1 % IJ SOLN
INTRAMUSCULAR | Status: AC | PRN
  Administered 2017-05-17: 30 mL

## 2017-05-17 MED ORDER — PIPERACILLIN-TAZOBACTAM 3.375 G IVPB: 3.3750 g | INTRAVENOUS | Status: DC

## 2017-05-17 MED ORDER — IOPAMIDOL (ISOVUE-300) INJECTION 61%
INTRAVENOUS | Status: AC
  Administered 2017-05-17: 20 mL
  Filled 2017-05-17: qty 50

## 2017-05-17 MED ORDER — FENTANYL CITRATE (PF) 100 MCG/2ML IJ SOLN
INTRAMUSCULAR | Status: AC | PRN
  Administered 2017-05-17 (×2): 50 ug via INTRAVENOUS

## 2017-05-17 MED ORDER — IOPAMIDOL (ISOVUE-370) INJECTION 76%: 50.0000 mL | Freq: Once | INTRAVENOUS | Status: DC | PRN

## 2017-05-17 MED ORDER — FENTANYL CITRATE (PF) 100 MCG/2ML IJ SOLN
INTRAMUSCULAR | Status: AC
  Filled 2017-05-17: qty 4

## 2017-05-17 MED ORDER — SODIUM CHLORIDE 0.9% FLUSH
5.0000 mL | Freq: Two times a day (BID) | INTRAVENOUS | Status: DC
  Administered 2017-05-17 – 2017-05-21 (×7): 5 mL

## 2017-05-17 NOTE — Progress Notes (Signed)
Initial Nutrition Assessment  DOCUMENTATION CODES:   Severe malnutrition in context of chronic illness, Underweight  INTERVENTION:  RD to monitor diet advancement and to order supplements as needed.   NUTRITION DIAGNOSIS:   Severe Malnutrition related to cancer and cancer related treatments, decreased appetite as evidenced by energy intake < 75% for > or equal to 1 month, severe fat depletion, severe muscle depletion, percent weight loss(15% < 6 month).  GOAL:   Patient will meet greater than or equal to 90% of their needs, Weight gain  MONITOR:   Diet advancement, Labs, Skin, Weight trends, I & O's  REASON FOR ASSESSMENT:   Consult Assessment of nutrition requirement/status  ASSESSMENT:   77 y.o. M 05/16/17 for biliary drainage catheter placement today. Pt has pancreatic cancer undergoing ChRT at [facility] admitted on 05/16/17. PMH of HTN, urinary frequency, arthritis, asthma.   Pt s/p biliary drainage catheter placement. Per MD note pt admitted on observation + procedure.   Pt last admission (discharged): 05/15/17 Per MD note pt is not a surgical candidate for removal of tumor.  Admitted for Obstructive jaundice from pancreatic adenocarcinoma: ERCP with self-expanding metallic stent placement for palliation completed 05/15/17 am.  Using measured weight on 01/22/17 (82.2 kg) and last measured weight on 05/01/17 (69.6 kg), pt lost 15% of body weight in < 6 months - significant. Pt weight trending down since 2016; pt weight 108.9 kg.   Pt reports losing a couple of pounds in the past week - not significant. Pt reports UBW before cancer (about 1 year ago) was 210 lbs and now he is 178 lbs. Pt reports loss of appetite and difficulty with ADLs since cancer diagnosis about a year ago.   Pt reports eating less than a fistful of food per day with some boost breeze. Pt also reports not taking vitamins due to now taking boost. Encouraged pt to drink the creamier supplements like ensure enlive as  this has more calories and protein which he needs; gave pt coupons, pt seems open to trying. Discussed with pt eating protein foods first and adding high calorie items to foods such as butter or peanut butter. Encouraged pt to continue taking the MVI even when taking supplements.   Medications reviewed: pepcid, zosyn.   Labs reviewed: BG 112 (H), adjusted Ca 10.1 (WNL), albumin 2 (L), alkaline phosphatase 161 (H), AST 99 (H), ALT 84 (H), total protein 6.4 (L), total billirubin 11 (H), RBC 3.28 (L), hemoglobin 10.4 (L), HCT 29.9 (L), PTT 16.4 (H).   NUTRITION - FOCUSED PHYSICAL EXAM:    Most Recent Value  Orbital Region  Moderate depletion  Upper Arm Region  Severe depletion  Thoracic and Lumbar Region  Severe depletion  Buccal Region  Severe depletion  Temple Region  Moderate depletion  Clavicle Bone Region  Severe depletion  Clavicle and Acromion Bone Region  Severe depletion  Scapular Bone Region  Unable to assess  Dorsal Hand  Severe depletion  Patellar Region  Moderate depletion  Anterior Thigh Region  Moderate depletion  Posterior Calf Region  Moderate depletion  Edema (RD Assessment)  None  Hair  Reviewed  Eyes  Reviewed  Mouth  Reviewed  Skin  Reviewed  Nails  Reviewed       Diet Order:   Diet Order           Diet NPO time specified Except for: Sips with Meds, Ice Chips  Diet effective midnight          EDUCATION NEEDS:  Education needs have been addressed  Skin:  Skin Assessment: Reviewed RN Assessment  Last BM:  unknown  Height:   Ht Readings from Last 1 Encounters:  05/16/17 6\' 3"  (1.905 m)    Weight:   Wt Readings from Last 1 Encounters:  05/16/17 137 lb 12.6 oz (62.5 kg)    Ideal Body Weight:  89.09 kg  BMI:  Body mass index is 17.22 kg/m.  Estimated Nutritional Needs:   Kcal:  2300-2500 kcal  Protein:  110-125 grams  Fluid:  >/= 1.6 L   Hope Budds, Dietetic Intern

## 2017-05-17 NOTE — Progress Notes (Addendum)
PROGRESS NOTE  Dustin Lucas UUV:[id number] DOB: February 13, 1940 DOA: 05/16/2017 PCP: Benito Mccreedy, MD  HPI/Recap of past 24 hours: Dustin Lucas is a 77 y.o. 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  Per discussion with patient's gastroenterologist Dr. Therisa Doyne patient was noted to have severe stenosis of the pyloric channel with underlying inflammation circumferential ulceration and bleeding.  She was unable to advance the gastroscope to the edema.  Dr. Hulen Skains from interventional radiology was called for consideration of external biliary drain and patient is being admitted for this for observation.  He currently reports some diffuse abdominal pain and significant anorexia but denies any fevers, cough, congestion, nausea, vomiting, diarrhea.  He does have significant early satiety.  05/17/17: seen and examined at his bedside. Has percutaneous drain on right side of abdomen. Has no new complaints. Pain is well controlled on current pain management.  RN reports fever with T-max of 101.  Portable chest x-ray and pan culture ordered.  Resume Zosyn empirically.   Assessment/Plan: Active Problems:   Abnormal LFTs   Essential hypertension, benign   Primary cancer of head of pancreas ([place])   Port catheter in place   BPH with obstruction/lower urinary tract symptoms   Cholestasis   Obstructive cholestatic liver disease   Protein-calorie malnutrition, severe  Obstructive jaundice due to pancreatic head cancer Migration of prior biliary stents Post IR internal/external biliary drain with cholangiogram Monitor percutaneous drain output Oncology  following  Fever with no clear source of infection T-max 101 Portable chest x-ray ordered Obtain sputum culture urine culture and blood cultures x2 Monitor fever curve Resume Zosyn IV empirically  Pancreatic cancer EUS in 2017 confirmed pancreatic head mass Post chemotherapy and radiation for locally advanced unresectable disease Resume chemotherapy once hyperbilirubinemia has resolved Follow-up outpatient on 05/23/2017  Severe protein calorie malnutrition Encourage increase protein calorie intake  BPH with obstructive symptoms status post cystoscopy  History of duodenal ulceration Continue PPI Continue home meds   Code Status: Full code  Family Communication: None at bedside  Disposition Plan: Home when clinically stable   Consultants:  Oncology  Interventional radiology  Procedures:  Percutaneous drain placement  Antimicrobials:  IV Zosyn    DVT prophylaxis: Subcu Lovenox   Objective: Vitals:   05/17/17 1135 05/17/17 1140 05/17/17 1431 05/17/17 1620  BP: (!) 156/81 (!) 150/80 (!) 142/70   Pulse:   96   Resp: 18 17 17    Temp:   100.1 F (37.8 C) (!) 101.4 F (38.6 C)  TempSrc:   Oral Oral  SpO2:   100%   Weight:      Height:        Intake/Output Summary (Last 24 hours) at 05/17/2017 1632 Last data filed at 05/17/2017 0300 Gross per 24 hour  Intake 836.25 ml  Output -  Net 836.25 ml   Filed Weights   05/16/17 1826  Weight: 62.5 kg (137 lb 12.6 oz)    Exam:  . General: 77 y.o. year-old male frail. Alert and oriented x3. . Cardiovascular: Regular rate and rhythm with no rubs or gallops.  No JVD or thyromegaly noted..   . Respiratory: Clear to auscultation with no wheezes or rales. Good inspiratory effort. Marland Kitchen  Abdomen: Soft nontender nondistended with normal bowel sounds x4 quadrants. . Musculoskeletal: No lower extremity edema. 2/4 pulses in all 4 extremities. . Skin: No ulcerative lesions noted or rashes, . Psychiatry: Mood is  appropriate for condition and setting   Data Reviewed: CBC: Recent Labs  Lab 05/17/17 0412  WBC 5.4  NEUTROABS 4.0  HGB 10.4*  HCT 29.9*  MCV 91.2  PLT 245   Basic Metabolic Panel: Recent Labs  Lab 05/17/17 0412  NA 140  K 3.6  CL 107  CO2 23  GLUCOSE 112*  BUN 14  CREATININE 0.65  CALCIUM 8.5*   GFR: Estimated Creatinine Clearance: 69.4 mL/min (by C-G formula based on SCr of 0.65 mg/dL). Liver Function Tests: Recent Labs  Lab 05/17/17 0412  AST 99*  ALT 84*  ALKPHOS 161*  BILITOT 11.0*  PROT 6.4*  ALBUMIN 2.0*   No results for input(s): LIPASE, AMYLASE in the last 168 hours. No results for input(s): AMMONIA in the last 168 hours. Coagulation Profile: Recent Labs  Lab 05/17/17 0412  INR 1.33   Cardiac Enzymes: No results for input(s): CKTOTAL, CKMB, CKMBINDEX, TROPONINI in the last 168 hours. BNP (last 3 results) No results for input(s): PROBNP in the last 8760 hours. HbA1C: No results for input(s): HGBA1C in the last 72 hours. CBG: No results for input(s): GLUCAP in the last 168 hours. Lipid Profile: No results for input(s): CHOL, HDL, LDLCALC, TRIG, CHOLHDL, LDLDIRECT in the last 72 hours. Thyroid Function Tests: No results for input(s): TSH, T4TOTAL, FREET4, T3FREE, THYROIDAB in the last 72 hours. Anemia Panel: No results for input(s): VITAMINB12, FOLATE, FERRITIN, TIBC, IRON, RETICCTPCT in the last 72 hours. Urine analysis:    Component Value Date/Time   COLORURINE RED (A) 08/19/2015 0126   APPEARANCEUR CLOUDY (A) 08/19/2015 0126   LABSPEC 1.010 03/15/2016 1513   PHURINE 6.5 03/15/2016 1513   PHURINE 6.0 08/19/2015 0126   GLUCOSEU Negative 03/15/2016 1513   HGBUR Small 03/15/2016 1513   HGBUR NEGATIVE 08/19/2015 0126   BILIRUBINUR Negative 03/15/2016 1513   KETONESUR Negative 03/15/2016 1513   KETONESUR NEGATIVE 08/19/2015 0126   PROTEINUR 30 03/15/2016 1513   PROTEINUR 30 (A) 08/19/2015 0126   UROBILINOGEN 0.2 03/15/2016 1513    NITRITE Negative 03/15/2016 1513   NITRITE POSITIVE (A) 08/19/2015 0126   LEUKOCYTESUR Moderate 03/15/2016 1513   Sepsis Labs: @LABRCNTIP (procalcitonin:4,lacticidven:4)  )No results found for this or any previous visit (from the past 240 hour(s)).    Studies: Ir Int Lianne Cure Biliary Drain With Cholangiogram  Result Date: 05/17/2017 INDICATION: Pancreatic carcinoma and biliary obstruction with inability to replace a biliary stent endoscopically in the common bile duct. Percutaneous biliary drainage now needed to relieve biliary obstruction and jaundice. EXAM: PERCUTANEOUS BILIARY DRAINAGE WITH PLACEMENT OF INTERNAL/EXTERNAL BILIARY DRAINAGE CATHETER INCLUDING CHOLANGIOGRAM MEDICATIONS: 3.375 g IV Zosyn; The antibiotic was administered within an appropriate time frame prior to the initiation of the procedure. ANESTHESIA/SEDATION: Moderate (conscious) sedation was employed during this procedure. A total of Versed 2.0 mg and Fentanyl 100 mcg was administered intravenously. Moderate Sedation Time: 24 minutes. The patient's level of consciousness and vital signs were monitored continuously by radiology nursing throughout the procedure under my direct supervision. FLUOROSCOPY TIME:  Fluoroscopy Time: 3.0 minutes.  32.7 mGy. CONTRAST:  20 mL [id number] COMPLICATIONS: None immediate. PROCEDURE: Informed written consent was obtained from the patient after a thorough discussion of the procedural risks, benefits and alternatives. All questions were addressed. Maximal Sterile Barrier Technique was utilized including caps, mask, sterile  gowns, sterile gloves, sterile drape, hand hygiene and skin antiseptic. A timeout was performed prior to the initiation of the procedure. Ultrasound was performed of the liver. Bile ducts in the right lobe were localized by ultrasound. Under direct ultrasound guidance, a 21 gauge needle was advanced into a peripheral right lobe bile duct. After return of bile, contrast was injected and a  cholangiographic fluoroscopic image obtained. A guidewire was advanced through the needle. A transitional dilator was placed. Additional contrast was injected at the level of the common bile duct through the dilator. The dilator was removed over a wire. A 5 French catheter was advanced over the wire. Over a hydrophilic guidewire, the 5 French catheter was advanced through a common bile duct stricture to the level of the duodenum. The tract was then dilated over a guidewire and a 10 French internal/external biliary drainage catheter advanced over the wire. The distal portion of the catheter was formed at the level of the duodenum. The catheter was injected with contrast material to confirm position and flushed. The catheter was then connected to a gravity drainage bag. It was secured at the skin with a Prolene retention suture and adhesive StatLock device. FINDINGS: After access of a right lobe bile duct, contrast injection and cholangiogram demonstrate high-grade obstruction of the biliary tree at the level of the common bile duct. 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 The stricture was able to be crossed with a guidewire allowing eventual placement of a 10 French internal/external biliary drainage catheter. After placement, the catheter is draining bile with some internal debris. IMPRESSION: Percutaneous biliary drainage procedure with placement of 10 French internal/external biliary drainage catheter after right lobe biliary access. 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 The biliary drainage catheter was attached to gravity bag drainage. Electronically Signed   By: Aletta Edouard M.D.   On: 05/17/2017 14:57    Scheduled Meds: . feeding supplement (ENSURE ENLIVE)  237 mL Oral BID BM  . fentaNYL      . lidocaine      . midazolam      . sodium chloride flush  5 mL Intracatheter Q12H     Continuous Infusions: . famotidine (PEPCID) IV Stopped (05/17/17 9924)  . [START ON 05/18/2017] piperacillin-tazobactam (ZOSYN)  IV       LOS: 1 day     Kayleen Memos, MD [facility] Pager [phone]  If 7PM-7AM, please contact night-coverage [email or web address] Password [facility] 05/17/2017, 4:32 PM

## 2017-05-17 NOTE — Procedures (Signed)
Interventional Radiology Procedure Note  Procedure: Biliary drainage with internal/external biliary drain placement  Complications: None  Estimated Blood Loss: None  Findings: Right lobe duct access with cholangiogram showing biliary obstruction at level of distal CBD. Stricture crossed and 10 Fr int/ext drainage catheter placed.  Distal catheter formed in duodenum.  Catheter attached to gravity drainage bag.  Venetia Night. Kathlene Cote, M.D Pager:  [phone]

## 2017-05-17 NOTE — Progress Notes (Addendum)
IP PROGRESS NOTE  Subjective:   Mr. Moster was noted to have hyperbilirubinemia when he was scheduled for gemcitabine/Abraxane on 05/09/2017.  He underwent an attempted ERCP on 05/15/2017.  Stenosis was noted at the pyloric channel with circumferential ulceration and edema.  The ampulla could not be accessed.  He was admitted for further evaluation. He continues to have anorexia.  No other complaint.  His wife is at the bedside.  Objective: Vital signs in last 24 hours: Blood pressure (!) 144/72, pulse 78, temperature 99.1 F (37.3 C), temperature source Oral, resp. rate 13, height 6\' 3"  (1.905 m), weight 137 lb 12.6 oz (62.5 kg), SpO2 99 %.  Intake/Output from previous day: 05/16 0701 - 05/17 0700 In: 836.3 [P.O.:60; I.V.:676.3; IV Piggyback:100] Out: -   Physical Exam:  HEENT: Scleral icterus, white coat over the tongue Lungs: Clear bilaterally Cardiac: Regular rate and rhythm Abdomen: Nontender, no mass, no hepatomegaly Extremities: No leg edema   Portacath/PICC-without erythema  Lab Results: Recent Labs    05/17/17 0412  WBC 5.4  HGB 10.4*  HCT 29.9*  PLT 218    BMET Recent Labs    05/17/17 0412  NA 140  K 3.6  CL 107  CO2 23  GLUCOSE 112*  BUN 14  CREATININE 0.65  CALCIUM 8.5*   Alkaline phosphatase 161, AST 99, ALT 84, bilirubin 11  Medications: I have reviewed the patient's current medications.  Assessment/Plan: 1. Pancreas cancer-presenting with obstructive jaundice  CT 08/18/2015 with intra-and extrahepatic Dillard dilatation and dilatation of the pancreas duct, vague fullness at the head of the pancreas  EUS 10/05/2015 confirmed a pancreas head mass with no evidence of vascular invasion and no peripancreatic adenopathy (uT2,uN0)  EUS biopsy of the pancreas head mass on 10/05/2015 confirmed adenocarcinoma  CT chest/abdomen at [facility] 11/16/2015-hypoenhancing mass in the head and uncinate process of the pancreas with increased markedpancreatic  ductal dilatation and greater than 180 encasement of the superior mesenteric artery and superior mesenteric vein above the level of the first tributary.  Cycle 1 FOLFOX 12/12/2015  Cycle 2 FOLFOX 12/27/2015  Cycle 3 FOLFOX 01/24/2016-Neulasta added  Cycle 4 FOLFOX 02/14/2016  Restaging CT scans 03/02/2016-decreasing size of pancreatic head/uncinate process mass with decreased encasement of the SMV (less than 180) and decreased pancreatic ductal dilatation. There remains greater than 180 of encasement of the SMA which is patent. Unchanged position of colon bile duct stent with decreasing right-sided pneumobilia. Stable 6 mm right upper lobe nodule.  Cycle 5 FOLFOX 03/13/2016 (oxaliplatin on hold due to poor tolerance)  Cycle 6 FOLFOX 03/27/2016 (oxaliplatin held)  Cycle 7 FOLFOX 04/10/2016 (oxaliplatin held)  Cycle 8 FOLFOX 04/24/2016 (oxaliplatin held)  MRI of the abdomen at [facility] on 05/23/2016 showed unchanged pancreatic head mass as compared to 05/02/2016 CT with similar encasement of the SMA and abutment/encasement of the SMV. No evidence of metastatic disease in the upper abdomen.   He completed a course of radiation delivered over 5 fractions on 06/18/2016 at [facility].  MRI abdomen at [facility] 10/30/2016-mildincrease in size of the pancreas head mass with encasement of the SMA and SMV, no evidence of metastatic disease  CT chest, abdomen, pelvis 04/15/2017- mass at the medial pancreas head/uncinate 8 with encasement of the SMA, mild to moderate intrahepatic and extrahepatic ductal dilatation  CTs at [facility] 04/30/2017- slight increased size of the pancreas head mass, encasement of the SMA, increase compression of the portal confluence and narrowing/occlusion of the superior mesenteric vein, no new site of disease  2.  Obstructive jaundice secondary to #1-status postplacement of a bile duct stent 10/05/2015  Recurrent obstructive jaundice May 2019  Aborted attempted ERCP 05/15/2017  secondary to stenosis/ulceration at the pyloric channel   3. Hypertension  4. Anorexia/weight loss secondary to #1  5. 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   Mr. Rebert has a history of pancreas cancer diagnosed in August 2017.  He is undergone treatment with FOLFOX chemotherapy and radiation for locally advanced unresectable disease.  Local disease progression has been documented on recent CTs.  The CA 19-9 is higher and his clinical status has declined.  The plan is to initiate gemcitabine/Abraxane chemotherapy.  He currently has biliary obstruction.  The previously placed stent is no longer visible.  An attempted ERCP stent placement was aborted on 05/15/2017 secondary to stenosis of the pyloric channel.  I discussed the situation with Mr. Albanese and his wife.  He request an appetite stimulant.  I planned to prescribe prednisone, but with the duodenal ulceration we will place this on hold.  I reviewed the case with interventional radiology.  He will undergo placement of a percutaneous biliary drain.  Mr. Gowan will be scheduled for outpatient follow-up at the Cancer center with the plan to begin gemcitabine/Abraxane chemotherapy once the hyperbilirubinemia resolved.  Recommendations: 1.  Proceed with external biliary drain placement  2.   Outpatient follow-up at the Cancer center as scheduled 05/23/2017, chemotherapy will be further delayed if the hyperbilirubinemia has not resolved.  Please call Oncology as needed.     LOS: 1 day   Betsy Coder, MD   05/17/2017, 7:47 AM

## 2017-05-18 DIAGNOSIS — K831 Obstruction of bile duct: Secondary | ICD-10-CM | POA: Diagnosis not present

## 2017-05-18 DIAGNOSIS — R945 Abnormal results of liver function studies: Secondary | ICD-10-CM | POA: Diagnosis not present

## 2017-05-18 DIAGNOSIS — N401 Enlarged prostate with lower urinary tract symptoms: Secondary | ICD-10-CM | POA: Diagnosis not present

## 2017-05-18 DIAGNOSIS — I10 Essential (primary) hypertension: Secondary | ICD-10-CM | POA: Diagnosis not present

## 2017-05-18 LAB — BASIC METABOLIC PANEL
Anion gap: 10 (ref 5–15)
BUN: 13 mg/dL (ref 6–20)
CHLORIDE: 108 mmol/L (ref 101–111)
CO2: 25 mmol/L (ref 22–32)
CREATININE: 0.66 mg/dL (ref 0.61–1.24)
Calcium: 8.6 mg/dL — ABNORMAL LOW (ref 8.9–10.3)
GFR calc Af Amer: >60 mL/min (ref >60)
GLUCOSE: 106 mg/dL — AB (ref 65–99)
Potassium: 4 mmol/L (ref 3.5–5.1)
Sodium: 143 mmol/L (ref 135–145)

## 2017-05-18 LAB — CBC
HEMATOCRIT: 29.6 % — AB (ref 39.0–52.0)
Hemoglobin: 10.1 g/dL — ABNORMAL LOW (ref 13.0–17.0)
MCH: 31.5 pg (ref 26.0–34.0)
MCHC: 34.1 g/dL (ref 30.0–36.0)
MCV: 92.2 fL (ref 78.0–100.0)
Platelets: 208 10*3/uL (ref 150–400)
RBC: 3.21 MIL/uL — ABNORMAL LOW (ref 4.22–5.81)
RDW: 15.7 % — AB (ref 11.5–15.5)
WBC: 6.9 10*3/uL (ref 4.0–10.5)

## 2017-05-18 MED ORDER — BOOST PLUS PO LIQD
237.0000 mL | Freq: Three times a day (TID) | ORAL | Status: DC
  Administered 2017-05-18 – 2017-05-19 (×2): 237 mL via ORAL
  Filled 2017-05-18 (×11): qty 237

## 2017-05-18 NOTE — Progress Notes (Signed)
PROGRESS NOTE  Dustin Lucas ZOX:[id number] DOB: 1940-10-05 DOA: 05/16/2017 PCP: Benito Mccreedy, MD  HPI/Recap of past 24 hours: Dustin Lucas is a 77 y.o. 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  Per discussion with patient's gastroenterologist Dr. Therisa Doyne patient was noted to have severe stenosis of the pyloric channel with underlying inflammation circumferential ulceration and bleeding.  She was unable to advance the gastroscope to the edema.  Dr. Hulen Skains from interventional radiology was called for consideration of external biliary drain and patient is being admitted for this for observation.  He currently reports some diffuse abdominal pain and significant anorexia but denies any fevers, cough, congestion, nausea, vomiting, diarrhea.  He does have significant early satiety.  05/17/17: seen and examined at his bedside. Has percutaneous drain on right side of abdomen. Has no new complaints. Pain is well controlled on current pain management.  RN reports fever with T-max of 101.  Portable chest x-ray and pan culture ordered.  Resume Zosyn empirically.  05/18/17: No new complaints. Right sided pain at site of drain is mild to moderate.    Assessment/Plan: Active Problems:   Abnormal LFTs   Essential hypertension, benign   Primary cancer of head of pancreas ([place])   Port catheter in place   BPH with obstruction/lower urinary tract symptoms   Cholestasis   Obstructive cholestatic liver disease   Protein-calorie malnutrition, severe  Obstructive jaundice, stable Due to pancreatic head cancer Migration of prior biliary stents Post IR  internal/external biliary drain with cholangiogram POD#1 Monitor percutaneous drain output Oncology following  Fever, resolved No clear source of infection T-max 101 cxr perso Portable chest x-ray ordered Obtain sputum culture urine culture and blood cultures x2 Monitor fever curve Resume Zosyn IV empirically  Pancreatic cancer EUS in 2017 confirmed pancreatic head mass Post chemotherapy and radiation for locally advanced unresectable disease Resume chemotherapy once hyperbilirubinemia has resolved Follow-up outpatient on 05/23/2017  Severe protein calorie malnutrition Encourage increase protein calorie intake Start oral supplement  BPH, stable BPH with obstructive symptoms status post cystoscopy  Hx of duodenal ulcers, stable Continue PPI Continue home meds  Physical debility/ PT to evaluate   Code Status: Full code  Family Communication: None at bedside  Disposition Plan: Home when clinically stable   Consultants:  Oncology  Interventional radiology  Procedures:  Percutaneous drain placement  Antimicrobials:  IV Zosyn    DVT prophylaxis: Subcu Lovenox   Objective: Vitals:   05/17/17 1620 05/17/17 2145 05/18/17 0539 05/18/17 1339  BP:  121/69 (!) 146/76 137/80  Pulse:  80 81 88  Resp:  16 16 15   Temp: (!) 101.4 F (38.6 C) 98.9 F (37.2 C) 99.3 F (37.4 C) 99.5 F (37.5 C)  TempSrc: Oral Oral Oral Oral  SpO2:  100% 100% 100%  Weight:      Height:        Intake/Output Summary (Last 24 hours) at 05/18/2017 1548 Last data filed at 05/18/2017 1346 Gross per 24 hour  Intake -  Output 1050 ml  Net -1050 ml   Filed Weights   05/16/17 1826  Weight: 62.5 kg (137 lb 12.6 oz)    Exam: 05/18/17  . General: 77 y.o. year-old male  frail NAD A&O x 3. . Cardiovascular: RRR no rubs or gallops. No JVD or thyromegaly. Marland Kitchen Respiratory: Clear to auscultation with no wheezes or rales. Good inspiratory effort. . Abdomen: Soft nontender nondistended  with normal bowel sounds x4 quadrants. . Musculoskeletal: No lower extremity edema. 2/4 pulses in all 4 extremities. . Skin: No ulcerative lesions noted or rashes, . Psychiatry: Mood is appropriate for condition and setting   Data Reviewed: CBC: Recent Labs  Lab 05/17/17 0412 05/18/17 0430  WBC 5.4 6.9  NEUTROABS 4.0  --   HGB 10.4* 10.1*  HCT 29.9* 29.6*  MCV 91.2 92.2  PLT 218 725   Basic Metabolic Panel: Recent Labs  Lab 05/17/17 0412 05/18/17 0430  NA 140 143  K 3.6 4.0  CL 107 108  CO2 23 25  GLUCOSE 112* 106*  BUN 14 13  CREATININE 0.65 0.66  CALCIUM 8.5* 8.6*   GFR: Estimated Creatinine Clearance: 69.4 mL/min (by C-G formula based on SCr of 0.66 mg/dL). Liver Function Tests: Recent Labs  Lab 05/17/17 0412  AST 99*  ALT 84*  ALKPHOS 161*  BILITOT 11.0*  PROT 6.4*  ALBUMIN 2.0*   No results for input(s): LIPASE, AMYLASE in the last 168 hours. No results for input(s): AMMONIA in the last 168 hours. Coagulation Profile: Recent Labs  Lab 05/17/17 0412  INR 1.33   Cardiac Enzymes: No results for input(s): CKTOTAL, CKMB, CKMBINDEX, TROPONINI in the last 168 hours. BNP (last 3 results) No results for input(s): PROBNP in the last 8760 hours. HbA1C: No results for input(s): HGBA1C in the last 72 hours. CBG: No results for input(s): GLUCAP in the last 168 hours. Lipid Profile: No results for input(s): CHOL, HDL, LDLCALC, TRIG, CHOLHDL, LDLDIRECT in the last 72 hours. Thyroid Function Tests: No results for input(s): TSH, T4TOTAL, FREET4, T3FREE, THYROIDAB in the last 72 hours. Anemia Panel: No results for input(s): VITAMINB12, FOLATE, FERRITIN, TIBC, IRON, RETICCTPCT in the last 72 hours. Urine analysis:    Component Value Date/Time   COLORURINE RED (A) 08/19/2015 0126   APPEARANCEUR CLOUDY (A) 08/19/2015 0126   LABSPEC 1.010 03/15/2016 1513   PHURINE 6.5 03/15/2016 1513   PHURINE 6.0 08/19/2015 0126   GLUCOSEU Negative 03/15/2016 1513   HGBUR  Small 03/15/2016 1513   HGBUR NEGATIVE 08/19/2015 0126   BILIRUBINUR Negative 03/15/2016 1513   KETONESUR Negative 03/15/2016 1513   KETONESUR NEGATIVE 08/19/2015 0126   PROTEINUR 30 03/15/2016 1513   PROTEINUR 30 (A) 08/19/2015 0126   UROBILINOGEN 0.2 03/15/2016 1513   NITRITE Negative 03/15/2016 1513   NITRITE POSITIVE (A) 08/19/2015 0126   LEUKOCYTESUR Moderate 03/15/2016 1513   Sepsis Labs: @LABRCNTIP (procalcitonin:4,lacticidven:4)  ) Recent Results (from the past 240 hour(s))  Culture, blood (routine x 2)     Status: None (Preliminary result)   Collection Time: 05/17/17  4:48 PM  Result Value Ref Range Status   Specimen Description   Final    BLOOD LEFT ARM Performed at [facility], [place] [address], [place], [place] [address]    Special Requests   Final    BOTTLES DRAWN AEROBIC AND ANAEROBIC Blood Culture adequate volume Performed at [place] [address], [place], [place] [address]    Culture   Final    NO GROWTH < 24 HOURS Performed at [place] [address], [place], [place] [address]    Report Status PENDING  Incomplete  Culture, blood (routine x 2)     Status: None (  Preliminary result)   Collection Time: 05/17/17  4:49 PM  Result Value Ref Range Status   Specimen Description   Final    BLOOD RIGHT ANTECUBITAL Performed at [facility] [address] [place], [place] [address]    Special Requests   Final    BOTTLES DRAWN AEROBIC AND ANAEROBIC Blood Culture adequate volume Performed at [place] [address] [place], [place] [address]    Culture   Final    NO GROWTH < 24 HOURS Performed at [facility] [address] [place], [place] [address]    Report Status PENDING  Incomplete      Studies: Dg Chest Port 1 View  Result Date: 05/17/2017 CLINICAL DATA:  Abdominal pain, fever and shortness of breath. Anorexia. EXAM: PORTABLE CHEST 1 VIEW  COMPARISON:  CT chest April 15, 2017 FINDINGS: Cardiomediastinal silhouette is normal. Trace calcific atherosclerosis aortic arch. No pleural effusions or focal consolidations. Trachea projects midline and there is no pneumothorax. Soft tissue planes and included osseous structures are non-suspicious. Single lumen RIGHT chest Port-A-Cath distal tip projects the cavoatrial junction. Stent in upper abdomen. IMPRESSION: No active disease. Aortic Atherosclerosis (ICD10-I70.0). Electronically Signed   By: Elon Alas M.D.   On: 05/17/2017 18:33    Scheduled Meds: . lactose free nutrition  237 mL Oral TID WC  . sodium chloride flush  5 mL Intracatheter Q12H    Continuous Infusions: . famotidine (PEPCID) IV Stopped (05/18/17 1115)     LOS: 2 days     Kayleen Memos, MD [facility] Pager [phone]  If 7PM-7AM, please contact night-coverage [email or web address] Password [facility] 05/18/2017, 3:48 PM

## 2017-05-19 DIAGNOSIS — N401 Enlarged prostate with lower urinary tract symptoms: Secondary | ICD-10-CM | POA: Diagnosis not present

## 2017-05-19 DIAGNOSIS — N390 Urinary tract infection, site not specified: Secondary | ICD-10-CM

## 2017-05-19 DIAGNOSIS — K831 Obstruction of bile duct: Secondary | ICD-10-CM | POA: Diagnosis not present

## 2017-05-19 DIAGNOSIS — I10 Essential (primary) hypertension: Secondary | ICD-10-CM | POA: Diagnosis not present

## 2017-05-19 DIAGNOSIS — R945 Abnormal results of liver function studies: Secondary | ICD-10-CM | POA: Diagnosis not present

## 2017-05-19 LAB — HEPATIC FUNCTION PANEL
ALBUMIN: 1.8 g/dL — AB (ref 3.5–5.0)
ALK PHOS: 129 U/L — AB (ref 38–126)
ALT: 56 U/L (ref 17–63)
AST: 43 U/L — AB (ref 15–41)
Bilirubin, Direct: 4.1 mg/dL — ABNORMAL HIGH (ref 0.1–0.5)
Indirect Bilirubin: 3.5 mg/dL — ABNORMAL HIGH (ref 0.3–0.9)
TOTAL PROTEIN: 6.2 g/dL — AB (ref 6.5–8.1)
Total Bilirubin: 7.6 mg/dL — ABNORMAL HIGH (ref 0.3–1.2)

## 2017-05-19 MED ORDER — SODIUM CHLORIDE 0.9 % IV SOLN
1.0000 g | INTRAVENOUS | Status: DC
  Administered 2017-05-19: 1 g via INTRAVENOUS
  Filled 2017-05-19: qty 1

## 2017-05-19 MED ORDER — FAMOTIDINE 20 MG PO TABS
20.0000 mg | ORAL_TABLET | Freq: Two times a day (BID) | ORAL | Status: DC
  Administered 2017-05-19 – 2017-05-21 (×4): 20 mg via ORAL
  Filled 2017-05-19 (×4): qty 1

## 2017-05-19 NOTE — Progress Notes (Signed)
PHARMACIST - PHYSICIAN COMMUNICATION   CONCERNING: IV to Oral Route Change Policy  RECOMMENDATION: This patient is receiving pepcid by the intravenous route.  Based on criteria approved by the Pharmacy and Therapeutics Committee, the intravenous medication(s) is/are being converted to the equivalent oral dose form(s).   DESCRIPTION: These criteria include:  The patient is eating (either orally or via tube) and/or has been taking other orally administered medications for a least 24 hours  The patient has no evidence of active gastrointestinal bleeding or impaired GI absorption (gastrectomy, short bowel, patient on TNA or NPO).  If you have questions about this conversion, please contact the Pharmacy Department  []   [phone] )  [facility] []   [phone] )  [facility] []   [phone] )  [facility] []   [phone] )  [facility] [x]   [phone] )  [facility]   Leodis Sias T, [facility] 05/19/2017 11:11 AM

## 2017-05-19 NOTE — Progress Notes (Signed)
PROGRESS NOTE  Dustin Lucas NLZ:[id number] DOB: [birth year] DOA: 05/16/2017 PCP: Benito Mccreedy, MD  HPI/Recap of past 24 hours: Dustin Lucas is a 77 y.o. 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  Per discussion with patient's gastroenterologist Dr. Therisa Doyne patient was noted to have severe stenosis of the pyloric channel with underlying inflammation circumferential ulceration and bleeding.  She was unable to advance the gastroscope to the edema.  Dr. Hulen Skains from interventional radiology was called for consideration of external biliary drain and patient is being admitted for this for observation.  He currently reports some diffuse abdominal pain and significant anorexia but denies any fevers, cough, congestion, nausea, vomiting, diarrhea.  He does have significant early satiety.  05/17/17: seen and examined at his bedside. Has percutaneous drain on right side of abdomen. Has no new complaints. Pain is well controlled on current pain management.  RN reports fever with T-max of 101.  Portable chest x-ray and pan culture ordered.  Resume Zosyn empirically.  05/18/17: No new complaints. Right sided pain at site of drain is mild to moderate.   05/19/17: No new complaints.   Assessment/Plan: Active Problems:   Abnormal LFTs   Essential hypertension, benign   Primary cancer of head of pancreas ([place])   Port catheter in place   BPH with obstruction/lower urinary tract symptoms   Cholestasis   Obstructive cholestatic liver disease   Protein-calorie malnutrition, severe  Obstructive jaundice, stable  Due to pancreatic head cancer Migration of prior  biliary stents Post IR internal/external biliary drain with cholangiogram POD #2 Monitor percutaneous drain output Oncology following  UTI, POA Urine culture done on 05/17/2017 revealed greater than 100,000 colonies of Klebsiella pneumoniae Awaiting sensitivities Start IV ceftriaxone  Fever, resolved Continue IV Zosyn Most likely urine source T-max 101 Chest x-ray done on 05/18/2017 personally reviewed unremarkable Blood cultures x2- today Urine culture positive for Klebsiella pneumoniae  Pancreatic cancer EUS in 2017 confirmed pancreatic head mass Post chemotherapy and radiation for locally advanced unresectable disease Resume chemotherapy once hyperbilirubinemia has resolved Follow-up outpatient on 05/23/2017  Severe protein calorie malnutrition Albumin 1.8 Continue oral supplement Encourage increase protein calorie intake  BPH, stable BPH with obstructive symptoms status post cystoscopy  History of duodenal ulcers, stable Continue PPI Continue home meds  Physical debility Fall precautions Out of bed to chair PT to evaluate   Code Status: Full code  Family Communication: None at bedside  Disposition Plan: Home when clinically stable   Consultants:  Oncology  Interventional radiology  Procedures:  Percutaneous drain placement  Antimicrobials:  IV Zosyn    DVT prophylaxis: Subcu Lovenox   Objective: Vitals:   05/18/17 2157 05/19/17 0247 05/19/17 0538 05/19/17 1318  BP: 134/70  140/76 133/75  Pulse: 76  79 79  Resp: 13  14 15   Temp: 99.4 F (37.4 C) 98.6 F (37 C) 99.3 F (37.4 C) 98.9 F (37.2 C)  TempSrc: Oral Oral Oral Oral  SpO2: 100%  100% 100%  Weight:      Height:        Intake/Output Summary (Last 24 hours) at 05/19/2017 1504 Last data filed at 05/18/2017 2130 Gross per 24 hour  Intake 205 ml  Output  330 ml  Net -125 ml   Filed Weights   05/16/17 1826  Weight: 62.5 kg (137 lb 12.6 oz)    Exam: 05/19/2017 . General: 77  y.o. year-old male frail in no acute distress.  Alert and oriented x3.   . Cardiovascular: Regular rate and rhythm with no rubs or gallops.  No JVD or thyromegaly noted.   Marland Kitchen Respiratory: Clear to auscultation with no wheezes or rales. Good inspiratory effort. . Abdomen: Soft nontender nondistended with normal bowel sounds x4 quadrants. . Musculoskeletal: No lower extremity edema. 2/4 pulses in all 4 extremities. . Skin: No ulcerative lesions noted or rashes, . Psychiatry: Mood is appropriate for condition and setting   Data Reviewed: CBC: Recent Labs  Lab 05/17/17 0412 05/18/17 0430  WBC 5.4 6.9  NEUTROABS 4.0  --   HGB 10.4* 10.1*  HCT 29.9* 29.6*  MCV 91.2 92.2  PLT 218 696   Basic Metabolic Panel: Recent Labs  Lab 05/17/17 0412 05/18/17 0430  NA 140 143  K 3.6 4.0  CL 107 108  CO2 23 25  GLUCOSE 112* 106*  BUN 14 13  CREATININE 0.65 0.66  CALCIUM 8.5* 8.6*   GFR: Estimated Creatinine Clearance: 69.4 mL/min (by C-G formula based on SCr of 0.66 mg/dL). Liver Function Tests: Recent Labs  Lab 05/17/17 0412 05/19/17 0500  AST 99* 43*  ALT 84* 56  ALKPHOS 161* 129*  BILITOT 11.0* 7.6*  PROT 6.4* 6.2*  ALBUMIN 2.0* 1.8*   No results for input(s): LIPASE, AMYLASE in the last 168 hours. No results for input(s): AMMONIA in the last 168 hours. Coagulation Profile: Recent Labs  Lab 05/17/17 0412  INR 1.33   Cardiac Enzymes: No results for input(s): CKTOTAL, CKMB, CKMBINDEX, TROPONINI in the last 168 hours. BNP (last 3 results) No results for input(s): PROBNP in the last 8760 hours. HbA1C: No results for input(s): HGBA1C in the last 72 hours. CBG: No results for input(s): GLUCAP in the last 168 hours. Lipid Profile: No results for input(s): CHOL, HDL, LDLCALC, TRIG, CHOLHDL, LDLDIRECT in the last 72 hours. Thyroid Function Tests: No results for input(s): TSH, T4TOTAL, FREET4, T3FREE, THYROIDAB in the last 72 hours. Anemia Panel: No results for input(s):  VITAMINB12, FOLATE, FERRITIN, TIBC, IRON, RETICCTPCT in the last 72 hours. Urine analysis:    Component Value Date/Time   COLORURINE RED (A) 08/19/2015 0126   APPEARANCEUR CLOUDY (A) 08/19/2015 0126   LABSPEC 1.010 03/15/2016 1513   PHURINE 6.5 03/15/2016 1513   PHURINE 6.0 08/19/2015 0126   GLUCOSEU Negative 03/15/2016 1513   HGBUR Small 03/15/2016 1513   HGBUR NEGATIVE 08/19/2015 0126   BILIRUBINUR Negative 03/15/2016 1513   KETONESUR Negative 03/15/2016 1513   KETONESUR NEGATIVE 08/19/2015 0126   PROTEINUR 30 03/15/2016 1513   PROTEINUR 30 (A) 08/19/2015 0126   UROBILINOGEN 0.2 03/15/2016 1513   NITRITE Negative 03/15/2016 1513   NITRITE POSITIVE (A) 08/19/2015 0126   LEUKOCYTESUR Moderate 03/15/2016 1513   Sepsis Labs: @LABRCNTIP (procalcitonin:4,lacticidven:4)  ) Recent Results (from the past 240 hour(s))  Culture, blood (routine x 2)     Status: None (Preliminary result)   Collection Time: 05/17/17  4:48 PM  Result Value Ref Range Status   Specimen Description   Final    BLOOD LEFT ARM Performed at [facility], [place] [address] [place], [place] [address]    Special Requests   Final    BOTTLES DRAWN AEROBIC AND ANAEROBIC Blood Culture adequate volume Performed at [facility], [place] [address] [place], [place] [address]    Culture   Final    NO GROWTH 2 DAYS Performed at [place] [address] [place], [place] [address]    Report Status PENDING  Incomplete  Culture, blood (routine x 2)     Status: None (Preliminary result)   Collection Time: 05/17/17  4:49 PM  Result Value Ref Range Status   Specimen Description   Final    BLOOD RIGHT ANTECUBITAL Performed at [facility] [address] [place], [place] [address]    Special Requests   Final    BOTTLES DRAWN AEROBIC AND ANAEROBIC Blood Culture adequate volume Performed at [facility] [address], [place], [place]  [address]    Culture   Final    NO GROWTH 2 DAYS Performed at [place] [address], [place], [place] [address]    Report Status PENDING  Incomplete  Culture, Urine     Status: Abnormal (Preliminary result)   Collection Time: 05/17/17  8:40 PM  Result Value Ref Range Status   Specimen Description   Final    URINE, CLEAN CATCH Performed at [facility], [place] [address] [place], [place] [address]    Special Requests   Final    NONE Performed at [facility], [place] [address] [place], [place] [address]    Culture (A)  Final    >=100,000 COLONIES/mL KLEBSIELLA PNEUMONIAE SUSCEPTIBILITIES TO FOLLOW Performed at [facility], [place] [address], [place], [place] [address]    Report Status PENDING  Incomplete  Body fluid culture     Status: None (Preliminary result)   Collection Time: 05/19/17  1:29 AM  Result Value Ref Range Status   Specimen Description   Final    BILE Performed at [place] [address], [place], [place] [address]    Special Requests   Final    NONE Performed at [facility], [place] [address], [place], [place] [address]    Gram Stain   Final    FEW WBC PRESENT, PREDOMINANTLY PMN ABUNDANT GRAM POSITIVE RODS RARE GRAM POSITIVE COCCI RARE BUDDING YEAST SEEN Performed at [facility], [place] [address], [place],  [address]    Culture PENDING  Incomplete   Report Status PENDING  Incomplete      Studies: No results found.  Scheduled Meds: . famotidine  20 mg Oral BID  . lactose free nutrition  237 mL Oral TID WC  . sodium chloride flush  5 mL Intracatheter Q12H    Continuous Infusions:    LOS: 3 days     Kayleen Memos, MD [facility] Pager [phone]  If 7PM-7AM, please contact night-coverage [email or web address] Password [facility] 05/19/2017, 3:04 PM

## 2017-05-19 NOTE — Progress Notes (Signed)
Referring Physician(s): Karki,A/Sherrill,B   Supervising Physician: Arne Cleveland  Patient Status:  [facility] - In-pt  Chief Complaint:  Obstructive jaundice, pancreatic cancer  Subjective: Pt doing ok today; no acute changes   Allergies: Shellfish allergy  Medications: Prior to Admission medications   Medication Sig Start Date End Date Taking? Authorizing Provider  cholecalciferol (VITAMIN D) 1000 units tablet Take 2,000 Units by mouth daily.   Yes [provider]  ibuprofen (ADVIL,MOTRIN) 200 MG tablet Take 200 mg by mouth every 6 (six) hours as needed for fever.   Yes [provider]  lactose free nutrition (BOOST) LIQD Take 237 mLs by mouth 2 (two) times daily between meals.   Yes [provider]  oxyCODONE-acetaminophen (PERCOCET/ROXICET) 5-325 MG tablet Take 0.5-1 tablets by mouth every 4 (four) hours as needed for severe pain. Patient taking differently: Take 1 tablet by mouth every 4 (four) hours as needed for severe pain.  05/13/17  Yes Owens Shark, NP  pantoprazole (PROTONIX) 40 MG tablet Take 40 mg by mouth 2 (two) times daily.  05/15/17  Yes [provider]  Potassium 99 MG TABS Take 99 mg by mouth daily.   Yes [provider]  loperamide (IMODIUM A-D) 2 MG tablet Take 2 mg by mouth 4 (four) times daily as needed for diarrhea or loose stools.    [provider]     Vital Signs: BP 140/76 (BP Location: Left Arm)   Pulse 79   Temp 99.3 F (37.4 C) (Oral)   Resp 14   Ht 6\' 3"  (1.905 m)   Wt 137 lb 12.6 oz (62.5 kg)   SpO2 100%   BMI 17.22 kg/m   Physical Exam biliary drain intact, dressing dry, site not sig tender, abd soft, output 530 cc dark green bile  Imaging: Dg Chest Port 1 View  Result Date: 05/17/2017 CLINICAL DATA:  Abdominal pain, fever and shortness of breath. Anorexia. EXAM: PORTABLE CHEST 1 VIEW COMPARISON:  CT chest April 15, 2017 FINDINGS: Cardiomediastinal silhouette is normal. Trace  calcific atherosclerosis aortic arch. No pleural effusions or focal consolidations. Trachea projects midline and there is no pneumothorax. Soft tissue planes and included osseous structures are non-suspicious. Single lumen RIGHT chest Port-A-Cath distal tip projects the cavoatrial junction. Stent in upper abdomen. IMPRESSION: No active disease. Aortic Atherosclerosis (ICD10-I70.0). Electronically Signed   By: Elon Alas M.D.   On: 05/17/2017 18:33   Ir Int Lianne Cure Biliary Drain With Cholangiogram  Result Date: 05/17/2017 INDICATION: Pancreatic carcinoma and biliary obstruction with inability to replace a biliary stent endoscopically in the common bile duct. Percutaneous biliary drainage now needed to relieve biliary obstruction and jaundice. EXAM: PERCUTANEOUS BILIARY DRAINAGE WITH PLACEMENT OF INTERNAL/EXTERNAL BILIARY DRAINAGE CATHETER INCLUDING CHOLANGIOGRAM MEDICATIONS: 3.375 g IV Zosyn; The antibiotic was administered within an appropriate time frame prior to the initiation of the procedure. ANESTHESIA/SEDATION: Moderate (conscious) sedation was employed during this procedure. A total of Versed 2.0 mg and Fentanyl 100 mcg was administered intravenously. Moderate Sedation Time: 24 minutes. The patient's level of consciousness and vital signs were monitored continuously by radiology nursing throughout the procedure under my direct supervision. FLUOROSCOPY TIME:  Fluoroscopy Time: 3.0 minutes.  32.7 mGy. CONTRAST:  20 mL [id number] COMPLICATIONS: None immediate. PROCEDURE: Informed written consent was obtained from the patient after a thorough discussion of the procedural risks, benefits and alternatives. All questions were addressed. Maximal Sterile Barrier Technique was utilized including caps, mask, sterile gowns, sterile gloves, sterile drape, hand hygiene and  skin antiseptic. A timeout was performed prior to the initiation of the procedure. Ultrasound was performed of the liver. Bile ducts in the right  lobe were localized by ultrasound. Under direct ultrasound guidance, a 21 gauge needle was advanced into a peripheral right lobe bile duct. After return of bile, contrast was injected and a cholangiographic fluoroscopic image obtained. A guidewire was advanced through the needle. A transitional dilator was placed. Additional contrast was injected at the level of the common bile duct through the dilator. The dilator was removed over a wire. A 5 French catheter was advanced over the wire. Over a hydrophilic guidewire, the 5 French catheter was advanced through a common bile duct stricture to the level of the duodenum. The tract was then dilated over a guidewire and a 10 French internal/external biliary drainage catheter advanced over the wire. The distal portion of the catheter was formed at the level of the duodenum. The catheter was injected with contrast material to confirm position and flushed. The catheter was then connected to a gravity drainage bag. It was secured at the skin with a Prolene retention suture and adhesive StatLock device. FINDINGS: After access of a right lobe bile duct, contrast injection and cholangiogram demonstrate high-grade obstruction of the biliary tree at the level of the common bile duct. 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 The stricture was able to be crossed with a guidewire allowing eventual placement of a 10 French internal/external biliary drainage catheter. After placement, the catheter is draining bile with some internal debris. IMPRESSION: Percutaneous biliary drainage procedure with placement of 10 French internal/external biliary drainage catheter after right lobe biliary access. 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 The biliary drainage catheter was attached to gravity bag drainage. Electronically Signed   By: Aletta Edouard M.D.   On: 05/17/2017 14:57     Labs:  CBC: Recent Labs    04/09/17 1300 05/09/17 1155 05/17/17 0412 05/18/17 0430  WBC 5.2 5.8 5.4 6.9  HGB 13.5 12.6* 10.4* 10.1*  HCT 39.4 37.4* 29.9* 29.6*  PLT 133* 171 218 208    COAGS: Recent Labs    05/17/17 0412  INR 1.33    BMP: Recent Labs    04/09/17 1300 05/09/17 1155 05/17/17 0412 05/18/17 0430  NA 143 140 140 143  K 4.2 3.5 3.6 4.0  CL 109 106 107 108  CO2 24 26 23 25   GLUCOSE 94 101 112* 106*  BUN 15 15 14 13   CALCIUM 9.9 9.7 8.5* 8.6*  CREATININE 0.97 0.80 0.65 0.66  GFRNONAA >60 >60 >60 >60  GFRAA >60 >60 >60 >60    LIVER FUNCTION TESTS: Recent Labs    04/09/17 1300 05/09/17 1155 05/17/17 0412 05/19/17 0500  BILITOT 1.3* 5.1* 11.0* 7.6*  AST 31 46* 99* 43*  ALT 27 74* 84* 56  ALKPHOS 101 227* 161* 129*  PROT 6.9 7.5 6.4* 6.2*  ALBUMIN 3.4* 2.5* 2.0* 1.8*    Assessment and Plan: Pt with hx panc ca/biliary- distal CBD obstruction; s/p I/E biliary drain placement 5/17;  Temp 99.3; t bili 7.6(11.0); bile cx/cytology pend; cont current tx, monitor labs(LFT's); ? stenting once LFT's closer to nl and if bile cx neg   Electronically Signed: D. Rowe Robert, PA-C 05/19/2017, 9:11 AM   I spent a total of 20 minutes at the the patient's bedside AND on the patient's hospital floor or unit, greater  than 50% of which was counseling/coordinating care for biliary drain    Patient ID: Dustin Lucas, male   DOB: [birth year], 77 y.o.   MRN: [id number]

## 2017-05-19 NOTE — Plan of Care (Signed)
Pt states he feels improved.

## 2017-05-20 ENCOUNTER — Telehealth: Payer: Self-pay

## 2017-05-20 DIAGNOSIS — R945 Abnormal results of liver function studies: Secondary | ICD-10-CM | POA: Diagnosis not present

## 2017-05-20 DIAGNOSIS — N401 Enlarged prostate with lower urinary tract symptoms: Secondary | ICD-10-CM | POA: Diagnosis not present

## 2017-05-20 DIAGNOSIS — K831 Obstruction of bile duct: Secondary | ICD-10-CM | POA: Diagnosis not present

## 2017-05-20 DIAGNOSIS — I10 Essential (primary) hypertension: Secondary | ICD-10-CM | POA: Diagnosis not present

## 2017-05-20 LAB — CBC
HEMATOCRIT: 28.7 % — AB (ref 39.0–52.0)
HEMOGLOBIN: 10.1 g/dL — AB (ref 13.0–17.0)
MCH: 31.9 pg (ref 26.0–34.0)
MCHC: 35.2 g/dL (ref 30.0–36.0)
MCV: 90.5 fL (ref 78.0–100.0)
Platelets: 246 10*3/uL (ref 150–400)
RBC: 3.17 MIL/uL — ABNORMAL LOW (ref 4.22–5.81)
RDW: 15.9 % — ABNORMAL HIGH (ref 11.5–15.5)
WBC: 6.8 10*3/uL (ref 4.0–10.5)

## 2017-05-20 LAB — BASIC METABOLIC PANEL
ANION GAP: 10 (ref 5–15)
BUN: 14 mg/dL (ref 6–20)
CHLORIDE: 105 mmol/L (ref 101–111)
CO2: 24 mmol/L (ref 22–32)
Calcium: 8.4 mg/dL — ABNORMAL LOW (ref 8.9–10.3)
Creatinine, Ser: 0.66 mg/dL (ref 0.61–1.24)
GFR calc non Af Amer: >60 mL/min (ref >60)
Glucose, Bld: 91 mg/dL (ref 65–99)
Potassium: 3.6 mmol/L (ref 3.5–5.1)
SODIUM: 139 mmol/L (ref 135–145)

## 2017-05-20 LAB — URINE CULTURE

## 2017-05-20 LAB — BILIRUBIN, TOTAL: BILIRUBIN TOTAL: 6.3 mg/dL — AB (ref 0.3–1.2)

## 2017-05-20 MED ORDER — CEPHALEXIN 500 MG PO CAPS
500.0000 mg | ORAL_CAPSULE | Freq: Two times a day (BID) | ORAL | Status: DC
  Administered 2017-05-20 – 2017-05-21 (×3): 500 mg via ORAL
  Filled 2017-05-20 (×3): qty 1

## 2017-05-20 NOTE — Telephone Encounter (Addendum)
Pt wife called to request call regarding "next step" in patient's care. Wife states "he doesn't recall everything that Dr. Benay Spice said to him this morning". Will make MD aware.   Per Dr. Benay Spice, treatment depends upon discharge and lab values. Will return in the AM to see the pt. Pt wife voiced understanding.

## 2017-05-20 NOTE — Progress Notes (Signed)
Referring Physician(s): Karki,A/Sherrill,B   Supervising Physician: Dr. Pascal Lux  Patient Status:  [name] - In-pt  Chief Complaint: Obstructive jaundice, pancreatic cancer  Subjective: Pt feeling ok. No new c/o   Allergies: Shellfish allergy  Medications:  Current Facility-Administered Medications:  .  acetaminophen (TYLENOL) tablet 650 mg, 650 mg, Oral, Q6H PRN, 650 mg at 05/17/17 1629 **OR** acetaminophen (TYLENOL) suppository 650 mg, 650 mg, Rectal, Q6H PRN, Purohit, Shrey C, MD .  cephALEXin (KEFLEX) capsule 500 mg, 500 mg, Oral, Q12H, Hall, Carole N, DO, 500 mg at 05/20/17 1009 .  famotidine (PEPCID) tablet 20 mg, 20 mg, Oral, BID, Eudelia Bunch, RPH, 20 mg at 05/20/17 1009 .  iopamidol (ISOVUE-370) 76 % injection 50 mL, 50 mL, Other, Once PRN, Aletta Edouard, MD .  lactose free nutrition (BOOST PLUS) liquid 237 mL, 237 mL, Oral, TID WC, Hall, Carole N, DO, 237 mL at 05/19/17 0757 .  loperamide (IMODIUM) capsule 2 mg, 2 mg, Oral, QID PRN, Purohit, Shrey C, MD, 2 mg at 05/18/17 1855 .  ondansetron (ZOFRAN) tablet 4 mg, 4 mg, Oral, Q6H PRN, 4 mg at 05/20/17 1411 **OR** ondansetron (ZOFRAN) injection 4 mg, 4 mg, Intravenous, Q6H PRN, Purohit, Shrey C, MD .  oxyCODONE-acetaminophen (PERCOCET/ROXICET) 5-325 MG per tablet 0.5-1 tablet, 0.5-1 tablet, Oral, Q4H PRN, Purohit, Konrad Dolores, MD, 1 tablet at 05/19/17 2057 .  polyethylene glycol (MIRALAX / GLYCOLAX) packet 17 g, 17 g, Oral, Daily PRN, Purohit, Shrey C, MD .  sodium chloride flush (NS) 0.9 % injection 5 mL, 5 mL, Intracatheter, Q12H, Aletta Edouard, MD, 5 mL at 05/20/17 1010    Vital Signs: BP (!) 143/75 (BP Location: Right Arm)   Pulse 74   Temp 98.7 F (37.1 C) (Oral)   Resp 16   Ht 6\' 3"  (1.905 m)   Wt 137 lb 12.6 oz (62.5 kg)   SpO2 100%   BMI 17.22 kg/m   Physical Exam  biliary drain intact, dressing dry, site clean, NT. output 450 cc bilious  Imaging: Dg Chest Port 1 View  Result Date:  05/17/2017 CLINICAL DATA:  Abdominal pain, fever and shortness of breath. Anorexia. EXAM: PORTABLE CHEST 1 VIEW COMPARISON:  CT chest April 15, 2017 FINDINGS: Cardiomediastinal silhouette is normal. Trace calcific atherosclerosis aortic arch. No pleural effusions or focal consolidations. Trachea projects midline and there is no pneumothorax. Soft tissue planes and included osseous structures are non-suspicious. Single lumen RIGHT chest Port-A-Cath distal tip projects the cavoatrial junction. Stent in upper abdomen. IMPRESSION: No active disease. Aortic Atherosclerosis (ICD10-I70.0). Electronically Signed   By: Elon Alas M.D.   On: 05/17/2017 18:33   Ir Int Lianne Cure Biliary Drain With Cholangiogram  Result Date: 05/17/2017 INDICATION: Pancreatic carcinoma and biliary obstruction with inability to replace a biliary stent endoscopically in the common bile duct. Percutaneous biliary drainage now needed to relieve biliary obstruction and jaundice. EXAM: PERCUTANEOUS BILIARY DRAINAGE WITH PLACEMENT OF INTERNAL/EXTERNAL BILIARY DRAINAGE CATHETER INCLUDING CHOLANGIOGRAM MEDICATIONS: 3.375 g IV Zosyn; The antibiotic was administered within an appropriate time frame prior to the initiation of the procedure. ANESTHESIA/SEDATION: Moderate (conscious) sedation was employed during this procedure. A total of Versed 2.0 mg and Fentanyl 100 mcg was administered intravenously. Moderate Sedation Time: 24 minutes. The patient's level of consciousness and vital signs were monitored continuously by radiology nursing throughout the procedure under my direct supervision. FLUOROSCOPY TIME:  Fluoroscopy Time: 3.0 minutes.  32.7 mGy. CONTRAST:  20 mL [id number] COMPLICATIONS: None immediate. PROCEDURE: Informed written consent was obtained  from the patient after a thorough discussion of the procedural risks, benefits and alternatives. All questions were addressed. Maximal Sterile Barrier Technique was utilized including caps, mask,  sterile gowns, sterile gloves, sterile drape, hand hygiene and skin antiseptic. A timeout was performed prior to the initiation of the procedure. Ultrasound was performed of the liver. Bile ducts in the right lobe were localized by ultrasound. Under direct ultrasound guidance, a 21 gauge needle was advanced into a peripheral right lobe bile duct. After return of bile, contrast was injected and a cholangiographic fluoroscopic image obtained. A guidewire was advanced through the needle. A transitional dilator was placed. Additional contrast was injected at the level of the common bile duct through the dilator. The dilator was removed over a wire. A 5 French catheter was advanced over the wire. Over a hydrophilic guidewire, the 5 French catheter was advanced through a common bile duct stricture to the level of the duodenum. The tract was then dilated over a guidewire and a 10 French internal/external biliary drainage catheter advanced over the wire. The distal portion of the catheter was formed at the level of the duodenum. The catheter was injected with contrast material to confirm position and flushed. The catheter was then connected to a gravity drainage bag. It was secured at the skin with a Prolene retention suture and adhesive StatLock device. FINDINGS: After access of a right lobe bile duct, contrast injection and cholangiogram demonstrate high-grade obstruction of the biliary tree at the level of the common bile duct. 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 The stricture was able to be crossed with a guidewire allowing eventual placement of a 10 French internal/external biliary drainage catheter. After placement, the catheter is draining bile with some internal debris. IMPRESSION: Percutaneous biliary drainage procedure with placement of 10 French internal/external biliary drainage catheter after right lobe biliary access. Cholangiogram demonstrates a high-grade stricture of the distal  common bile duct which was able to be crossed with a catheter. The biliary drainage catheter was attached to gravity bag drainage. Electronically Signed   By: Aletta Edouard M.D.   On: 05/17/2017 14:57    Labs:  CBC: Recent Labs    05/09/17 1155 05/17/17 0412 05/18/17 0430 05/20/17 0500  WBC 5.8 5.4 6.9 6.8  HGB 12.6* 10.4* 10.1* 10.1*  HCT 37.4* 29.9* 29.6* 28.7*  PLT 171 218 208 246    COAGS: Recent Labs    05/17/17 0412  INR 1.33    BMP: Recent Labs    05/09/17 1155 05/17/17 0412 05/18/17 0430 05/20/17 0500  NA 140 140 143 139  K 3.5 3.6 4.0 3.6  CL 106 107 108 105  CO2 26 23 25 24   GLUCOSE 101 112* 106* 91  BUN 15 14 13 14   CALCIUM 9.7 8.5* 8.6* 8.4*  CREATININE 0.80 0.65 0.66 0.66  GFRNONAA >60 >60 >60 >60  GFRAA >60 >60 >60 >60    LIVER FUNCTION TESTS: Recent Labs    04/09/17 1300 05/09/17 1155 05/17/17 0412 05/19/17 0500  BILITOT 1.3* 5.1* 11.0* 7.6*  AST 31 46* 99* 43*  ALT 27 74* 84* 56  ALKPHOS 101 227* 161* 129*  PROT 6.9 7.5 6.4* 6.2*  ALBUMIN 3.4* 2.5* 2.0* 1.8*    Assessment and Plan: Pt with hx panc ca/biliary- distal CBD obstruction; s/p I/E biliary drain placement 5/17;   Cx pending, T Bili trending down. IR following   Electronically Signed: Ascencion Dike, PA-C 05/20/2017, 2:11 PM   I spent  a total of 20 minutes at the the patient's bedside AND on the patient's hospital floor or unit, greater than 50% of which was counseling/coordinating care for biliary drain

## 2017-05-20 NOTE — Progress Notes (Signed)
PROGRESS NOTE  Dustin Lucas VFI:[id number] DOB: 10/01/1940 DOA: 05/16/2017 PCP: Dustin Mccreedy, MD  HPI/Recap of past 24 hours: Dustin Lucas is a 77 y.o. 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  Per discussion with patient's gastroenterologist Dr. Therisa Lucas patient was noted to have severe stenosis of the pyloric channel with underlying inflammation circumferential ulceration and bleeding.  She was unable to advance the gastroscope to the edema.  Dr. Hulen Lucas from interventional radiology was called for consideration of external biliary drain and patient is being admitted for this for observation.  He currently reports some diffuse abdominal pain and significant anorexia but denies any fevers, cough, congestion, nausea, vomiting, diarrhea.  He does have significant early satiety.  05/17/17: seen and examined at his bedside. Has percutaneous drain on right side of abdomen. Has no new complaints. Pain is well controlled on current pain management.  RN reports fever with T-max of 101.  Portable chest x-ray and pan culture ordered.  Resume Zosyn empirically.  05/18/17: No new complaints. Right sided pain at site of drain is mild to moderate.   05/19/17: No new complaints.  05/20/17: No new complaints. Denies nausea or abd pain. T bili continues to trend down but still elevated at 6.3 from 7 from 11 on presentation.   Assessment/Plan: Active Problems:   Abnormal LFTs   Essential hypertension, benign   Primary cancer of head of pancreas ([place])   Port catheter in place   BPH with obstruction/lower urinary tract symptoms   Cholestasis   Obstructive  cholestatic liver disease   Protein-calorie malnutrition, severe  Obstructive jaundice, stable POD#3 Due to pancreatic head cancer Migration of prior biliary stents Post IR internal/external biliary drain with cholangiogram  Monitor percutaneous drain output Oncology following  UTI, poa Urine culture done on 05/17/2017 revealed greater than 100,000 colonies of Klebsiella pneumoniae IV ceftriaxone switched to keflex bid.  Fever, resolved C/w keflex Most likely from UTI T-max 101 Chest x-ray done on 05/18/2017 personally reviewed unremarkable Blood cultures x2- today Urine culture positive for Klebsiella pneumoniae  Pancreatic cancer EUS in 2017 confirmed pancreatic head mass Post chemotherapy and radiation for locally advanced unresectable disease Resume chemotherapy once hyperbilirubinemia has resolved Follow-up outpatient on 05/23/2017  Severe protein calorie malnutrition Albumin 1.8 Continue oral supplement Encourage increase protein calorie intake  BPH, stable BPH with obstructive symptoms status post cystoscopy  Hx of duodenal ulcers, stable Continue PPI Continue home meds  Physical debility Fall precautions Out of bed to chair PT to evaluate   Code Status: Full code  Family Communication: None at bedside  Disposition Plan: Home when clinically stable   Consultants:  Oncology  Interventional radiology  Procedures:  Percutaneous drain placement  Antimicrobials:  Keflex     DVT prophylaxis: Subcu Lovenox   Objective: Vitals:   05/19/17 1318 05/19/17 2109 05/20/17 0532 05/20/17 1450  BP: 133/75 (!) 151/78 (!) 143/75 (!) 150/80  Pulse: 79 78 74 73  Resp: 15 16 16 12   Temp: 98.9 F (37.2 C) 99 F (37.2 C) 98.7 F (37.1 C) 99.7 F (37.6 C)  TempSrc: Oral Oral Oral Oral  SpO2: 100% 100% 100% 100%  Weight:      Height:  Intake/Output Summary (Last 24 hours) at 05/20/2017 1648 Last data filed at 05/20/2017 0526 Gross per 24 hour   Intake 105 ml  Output 550 ml  Net -445 ml   Filed Weights   05/16/17 1826  Weight: 62.5 kg (137 lb 12.6 oz)    Exam: 05/20/17 . General: 77 y.o. year-old male frail NAD A&O x 3. . Cardiovascular: RRR no rubs or gallops. No JVD or thyromegaly. Marland Kitchen Respiratory: Clear to auscultation with no wheezes or rales. Good inspiratory effort. . Abdomen: Soft nontender nondistended with normal bowel sounds x4 quadrants. . Musculoskeletal: No lower extremity edema. 2/4 pulses in all 4 extremities. . Skin: No ulcerative lesions noted or rashes, . Psychiatry: Mood is appropriate for condition and setting   Data Reviewed: CBC: Recent Labs  Lab 05/17/17 0412 05/18/17 0430 05/20/17 0500  WBC 5.4 6.9 6.8  NEUTROABS 4.0  --   --   HGB 10.4* 10.1* 10.1*  HCT 29.9* 29.6* 28.7*  MCV 91.2 92.2 90.5  PLT 218 208 502   Basic Metabolic Panel: Recent Labs  Lab 05/17/17 0412 05/18/17 0430 05/20/17 0500  NA 140 143 139  K 3.6 4.0 3.6  CL 107 108 105  CO2 23 25 24   GLUCOSE 112* 106* 91  BUN 14 13 14   CREATININE 0.65 0.66 0.66  CALCIUM 8.5* 8.6* 8.4*   GFR: Estimated Creatinine Clearance: 69.4 mL/min (by C-G formula based on SCr of 0.66 mg/dL). Liver Function Tests: Recent Labs  Lab 05/17/17 0412 05/19/17 0500 05/20/17 1425  AST 99* 43*  --   ALT 84* 56  --   ALKPHOS 161* 129*  --   BILITOT 11.0* 7.6* 6.3*  PROT 6.4* 6.2*  --   ALBUMIN 2.0* 1.8*  --    No results for input(s): LIPASE, AMYLASE in the last 168 hours. No results for input(s): AMMONIA in the last 168 hours. Coagulation Profile: Recent Labs  Lab 05/17/17 0412  INR 1.33   Cardiac Enzymes: No results for input(s): CKTOTAL, CKMB, CKMBINDEX, TROPONINI in the last 168 hours. BNP (last 3 results) No results for input(s): PROBNP in the last 8760 hours. HbA1C: No results for input(s): HGBA1C in the last 72 hours. CBG: No results for input(s): GLUCAP in the last 168 hours. Lipid Profile: No results for input(s): CHOL,  HDL, LDLCALC, TRIG, CHOLHDL, LDLDIRECT in the last 72 hours. Thyroid Function Tests: No results for input(s): TSH, T4TOTAL, FREET4, T3FREE, THYROIDAB in the last 72 hours. Anemia Panel: No results for input(s): VITAMINB12, FOLATE, FERRITIN, TIBC, IRON, RETICCTPCT in the last 72 hours. Urine analysis:    Component Value Date/Time   COLORURINE RED (A) 08/19/2015 0126   APPEARANCEUR CLOUDY (A) 08/19/2015 0126   LABSPEC 1.010 03/15/2016 1513   PHURINE 6.5 03/15/2016 1513   PHURINE 6.0 08/19/2015 0126   GLUCOSEU Negative 03/15/2016 1513   HGBUR Small 03/15/2016 1513   HGBUR NEGATIVE 08/19/2015 0126   BILIRUBINUR Negative 03/15/2016 1513   KETONESUR Negative 03/15/2016 1513   KETONESUR NEGATIVE 08/19/2015 0126   PROTEINUR 30 03/15/2016 1513   PROTEINUR 30 (A) 08/19/2015 0126   UROBILINOGEN 0.2 03/15/2016 1513   NITRITE Negative 03/15/2016 1513   NITRITE POSITIVE (A) 08/19/2015 0126   LEUKOCYTESUR Moderate 03/15/2016 1513   Sepsis Labs: @LABRCNTIP (procalcitonin:4,lacticidven:4)  ) Recent Results (from the past 240 hour(s))  Culture, blood (routine x 2)     Status: None (Preliminary result)   Collection Time: 05/17/17  4:48 PM  Result Value Ref Range Status   Specimen Description  Final    BLOOD LEFT ARM Performed at [facility] [address], [place], [place] [address]    Special Requests   Final    BOTTLES DRAWN AEROBIC AND ANAEROBIC Blood Culture adequate volume Performed at [facility] [name], [place], [place] [address]    Culture   Final    NO GROWTH 3 DAYS Performed at [facility], [place] [address] [place], [place] [address]    Report Status PENDING  Incomplete  Culture, blood (routine x 2)     Status: None (Preliminary result)   Collection Time: 05/17/17  4:49 PM  Result Value Ref Range Status   Specimen Description   Final    BLOOD RIGHT ANTECUBITAL Performed at [facility]  [address] [place], [place] [address]    Special Requests   Final    BOTTLES DRAWN AEROBIC AND ANAEROBIC Blood Culture adequate volume Performed at [facility] [address] [place], [place] [address]    Culture   Final    NO GROWTH 3 DAYS Performed at [facility], [place] [address] [place], [place] [address]    Report Status PENDING  Incomplete  Culture, Urine     Status: Abnormal   Collection Time: 05/17/17  8:40 PM  Result Value Ref Range Status   Specimen Description   Final    URINE, CLEAN CATCH Performed at [facility], [place] [address], [place], [place] [address]    Special Requests   Final    NONE Performed at [facility], [place] [address], [place], [place] [address]    Culture >=100,000 COLONIES/mL KLEBSIELLA PNEUMONIAE (A)  Final   Report Status 05/20/2017 FINAL  Final   Organism ID, Bacteria KLEBSIELLA PNEUMONIAE (A)  Final      Susceptibility   Klebsiella pneumoniae - MIC*    AMPICILLIN RESISTANT Resistant     CEFAZOLIN <=4 SENSITIVE Sensitive     CEFTRIAXONE <=1 SENSITIVE Sensitive     CIPROFLOXACIN 2 INTERMEDIATE Intermediate     GENTAMICIN <=1 SENSITIVE Sensitive     IMIPENEM <=0.25 SENSITIVE Sensitive     NITROFURANTOIN 64 INTERMEDIATE Intermediate     TRIMETH/SULFA <=20 SENSITIVE Sensitive     AMPICILLIN/SULBACTAM <=2 SENSITIVE Sensitive     PIP/TAZO <=4 SENSITIVE Sensitive     Extended ESBL NEGATIVE Sensitive     * >=100,000 COLONIES/mL KLEBSIELLA PNEUMONIAE  Body fluid culture     Status: None (Preliminary result)   Collection Time: 05/19/17  1:29 AM  Result Value Ref Range Status   Specimen Description   Final    BILE Performed at [facility] [address], [place], [place] [address]    Special Requests   Final    NONE Performed at [facility], [place] [address] [place], [place] [address]    Gram Stain   Final    FEW WBC PRESENT, PREDOMINANTLY  PMN ABUNDANT GRAM POSITIVE RODS RARE GRAM POSITIVE COCCI RARE BUDDING YEAST SEEN    Culture   Final    CULTURE REINCUBATED FOR BETTER GROWTH Performed at [facility], [place] [address] [place],  [address]    Report Status PENDING  Incomplete      Studies: No results found.  Scheduled Meds: . cephALEXin  500 mg Oral Q12H  . famotidine  20 mg Oral BID  . lactose free nutrition  237 mL Oral TID WC  .  sodium chloride flush  5 mL Intracatheter Q12H    Continuous Infusions:    LOS: 0 days     Kayleen Memos, MD [facility] Pager [phone]  If 7PM-7AM, please contact night-coverage [email or web address] Password [facility] 05/20/2017, 4:48 PM

## 2017-05-21 DIAGNOSIS — R509 Fever, unspecified: Secondary | ICD-10-CM | POA: Diagnosis not present

## 2017-05-21 DIAGNOSIS — C801 Malignant (primary) neoplasm, unspecified: Secondary | ICD-10-CM | POA: Diagnosis not present

## 2017-05-21 DIAGNOSIS — B961 Klebsiella pneumoniae [K. pneumoniae] as the cause of diseases classified elsewhere: Secondary | ICD-10-CM | POA: Diagnosis not present

## 2017-05-21 DIAGNOSIS — N390 Urinary tract infection, site not specified: Secondary | ICD-10-CM | POA: Diagnosis not present

## 2017-05-21 DIAGNOSIS — R945 Abnormal results of liver function studies: Secondary | ICD-10-CM | POA: Diagnosis not present

## 2017-05-21 DIAGNOSIS — C250 Malignant neoplasm of head of pancreas: Secondary | ICD-10-CM | POA: Diagnosis not present

## 2017-05-21 DIAGNOSIS — N401 Enlarged prostate with lower urinary tract symptoms: Secondary | ICD-10-CM | POA: Diagnosis not present

## 2017-05-21 DIAGNOSIS — K831 Obstruction of bile duct: Secondary | ICD-10-CM | POA: Diagnosis not present

## 2017-05-21 LAB — HEPATIC FUNCTION PANEL
ALBUMIN: 1.8 g/dL — AB (ref 3.5–5.0)
ALT: 49 U/L (ref 17–63)
AST: 50 U/L — AB (ref 15–41)
Alkaline Phosphatase: 122 U/L (ref 38–126)
Bilirubin, Direct: 3.1 mg/dL — ABNORMAL HIGH (ref 0.1–0.5)
Indirect Bilirubin: 2.8 mg/dL — ABNORMAL HIGH (ref 0.3–0.9)
TOTAL PROTEIN: 6.5 g/dL (ref 6.5–8.1)
Total Bilirubin: 5.9 mg/dL — ABNORMAL HIGH (ref 0.3–1.2)

## 2017-05-21 LAB — PROCALCITONIN: PROCALCITONIN: 0.33 ng/mL

## 2017-05-21 MED ORDER — FAMOTIDINE 20 MG PO TABS: 20.0000 mg | ORAL_TABLET | Freq: Two times a day (BID) | ORAL | 0 refills | Status: DC

## 2017-05-21 MED ORDER — LIP MEDEX EX OINT
TOPICAL_OINTMENT | CUTANEOUS | Status: AC
  Administered 2017-05-21: 16:00:00
  Filled 2017-05-21: qty 7

## 2017-05-21 MED ORDER — CEPHALEXIN 500 MG PO CAPS: 500.0000 mg | ORAL_CAPSULE | Freq: Two times a day (BID) | ORAL | 0 refills | Status: AC

## 2017-05-21 MED ORDER — DRONABINOL 2.5 MG PO CAPS
2.5000 mg | ORAL_CAPSULE | Freq: Two times a day (BID) | ORAL | Status: DC
  Administered 2017-05-21 (×2): 2.5 mg via ORAL
  Filled 2017-05-21 (×2): qty 1

## 2017-05-21 MED ORDER — HEPARIN SOD (PORK) LOCK FLUSH 100 UNIT/ML IV SOLN
500.0000 [IU] | INTRAVENOUS | Status: DC | PRN
  Filled 2017-05-21: qty 5

## 2017-05-21 NOTE — Progress Notes (Signed)
Met with pt and wife at bedside to discuss disposition plan. Pt participated in PT session earlier- recommended SNF depending on progress.  Wife states they would decline both SNF and home health therapy at [place]. "We have done both before, feel like he does better with [place] at home. His daughter is a CNA and helps [place] a lot." States they have accommodated the house with rails, shower chair, bedside commode. Requested hospital bed due to issues with pt getting into and positioning in bed. Attending and CM updated.  Sharren Bridge, MSW, LCSW Clinical Social Work 05/21/2017 [phone]

## 2017-05-21 NOTE — Progress Notes (Signed)
    Durable Medical Equipment  (From admission, onward)        Start     Ordered   05/21/17 1402  For home use only DME Hospital bed  Once    Question Answer Comment  Patient has (list medical condition): pancreatic cancer   The above medical condition requires: Patient requires the ability to reposition frequently   Bed type Semi-electric      05/21/17 1401

## 2017-05-21 NOTE — Evaluation (Signed)
Physical Therapy Evaluation Patient Details Name: Dustin Lucas MRN: [id number] DOB: 02/21/1940 Today's Date: 05/21/2017   History of Present Illness  77 y.o. 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 Pt currently with functional limitations due to the deficits listed below (see PT Problem List). Pt ambulated 42' with RW, distance limited by 8/10 abdominal pain. Min assist for bed mobility. May need ST-SNF, depending on progress Pt will benefit from skilled PT to increase their independence and safety with mobility to allow discharge to the venue listed below.       Follow Up Recommendations SNF;Supervision for mobility/OOB(may need ST-SNF if family unable to manage pt's care, depending on pt's progress)    Equipment Recommendations  Wheelchair cushion (measurements PT);Wheelchair (measurements PT)    Recommendations for Other Services       Precautions / Restrictions Precautions Precautions: Fall Precaution Comments: pt denies falls in past 1 year, R side drain Restrictions Weight Bearing Restrictions: No      Mobility  Bed Mobility Overal bed mobility: Needs Assistance Bed Mobility: Supine to Sit;Sit to Supine     Supine to sit: Min assist;HOB elevated Sit to supine: Min assist   General bed mobility comments: min A for LEs in/out of bed, HOB up  Transfers Overall transfer level: Needs assistance Equipment used: Rolling walker (2 wheeled) Transfers: Sit to/from Stand Sit to Stand: Min guard         General transfer comment: min/guard for safety, VCs hand placement, sit to stand x 5 trials 2* multiple trips to 3  in 1  Ambulation/Gait Ambulation/Gait assistance: Min guard Ambulation Distance (Feet): 38 Feet Assistive device: Rolling walker (2 wheeled) Gait Pattern/deviations: Step-through pattern;Decreased stride length;Trunk flexed Gait velocity: decr   General Gait Details: distance limited by abdominal pain, flexed trunk also due to abdominal pain, no LOB  Stairs            Wheelchair Mobility    Modified Rankin (Stroke Patients Only)       Balance Overall balance assessment: Needs assistance   Sitting balance-Leahy Scale: Good     Standing balance support: Bilateral upper extremity supported Standing balance-Leahy Scale: Poor                               Pertinent Vitals/Pain Pain Assessment: 0-10 Pain Score: 8  Pain Location: abdomen with walking Pain Descriptors / Indicators: Sore Pain Intervention(s): Limited activity within patient's tolerance;Monitored during session;Patient requesting pain meds-RN notified    Home Living Family/patient expects to be discharged to:: Private residence Living Arrangements: Spouse/significant other Available Help at Discharge: Family;Available 24 hours/day Type of Home: House Home Access: Stairs to enter Entrance Stairs-Rails: Can reach both;Left;Right Entrance Stairs-Number of Steps: 6 Home Layout: One level Home Equipment: Walker - 2 wheels;Walker - 4 wheels;Shower seat;Bedside commode      Prior Function Level of Independence: Independent with assistive device(s)         Comments: has walked with RW since R THA 2016, pt stated he's had trouble with R hip since THA     Hand Dominance        Extremity/Trunk Assessment  Upper Extremity Assessment Upper Extremity Assessment: Overall WFL for tasks assessed    Lower Extremity Assessment Lower Extremity Assessment: Generalized weakness(pt reports difficulty with R hip since THA in 2016; B knee ext 4/5)    Cervical / Trunk Assessment Cervical /  Trunk Assessment: Kyphotic  Communication   Communication: No difficulties  Cognition Arousal/Alertness: Awake/alert Behavior During Therapy: WFL for tasks assessed/performed Overall Cognitive Status: Within Functional Limits for tasks assessed                                        General Comments      Exercises     Assessment/Plan    PT Assessment Patient needs continued PT services  PT Problem List Decreased strength;Decreased mobility;Decreased activity tolerance;Decreased balance;Pain       PT Treatment Interventions Functional mobility training;Gait training;Therapeutic activities;Stair training;Therapeutic exercise;Patient/family education    PT Goals (Current goals can be found in the Care Plan section)  Acute Rehab PT Goals 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 : Unable Difficulty sitting down on and standing up from a chair with arms (e.g., wheelchair, bedside commode, etc,.)?: A Lot Help needed moving to and from a bed to chair (including a wheelchair)?: A Little Help needed walking in hospital room?: A Little Help needed climbing 3-5 steps with a railing? : A Lot 6 Click Score: 14    End of Session Equipment Utilized During Treatment: Gait belt Activity Tolerance: Patient limited by pain;Patient limited by fatigue Patient left: in bed;with call bell/phone within reach;with bed alarm set Nurse Communication: Mobility status;Patient requests pain meds PT Visit Diagnosis: Difficulty in  walking, not elsewhere classified (R26.2);Pain    Time: 3734-2876 PT Time Calculation (min) (ACUTE ONLY): 34 min   Charges:   PT Evaluation $PT Eval Low Complexity: 1 Low PT Treatments $Gait Training: 8-22 mins   PT G Codes:          Philomena Doheny 05/21/2017, 12:45 PM [phone]

## 2017-05-21 NOTE — Discharge Instructions (Signed)
Biliary Drainage Catheter Placement A biliary drainage catheter is a thin, flexible tube that is inserted through the skin into the bile ducts in the liver. This is sometimes called a percutaneous transhepatic biliary drainage catheter. Bile is a thick yellow or green fluid that helps digest fat in foods. The purpose of a biliary drainage catheter is to keep bile from backing up into the liver. Backup of bile can occur when there is a blockage that prevents bile from moving from the bile ducts into the small intestine as it should. The blockage can be caused by gallstones, a tumor, or scar tissue.  There are three types of biliary drainage:  External biliary drainage. With this type, bile is only drained into a collection bag outside the body (external collection bag).  Internal-external biliary drainage. With this type, bile is drained to an outside collection bag as well as into the small intestine.  Internal biliary drainage. With this type, bile is only drained into the small intestine.  Tell a health care provider about:  Any allergies you have.  All medicines you are taking, including vitamins, herbs, eye drops, creams, and over-the-counter medicines.  Any problems you or family members have had with anesthetic medicines.  Any blood disorders you have.  Any surgeries you have had.  Any medical conditions you have.  Whether you are pregnant or may be pregnant. What are the risks? Generally, this is a safe procedure. However, problems may occur, including:  Bleeding.  Infection.  Damage to other structures or organs, such as the liver, bile ducts, intestines, or stomach.  A bile leak.  Allergic reaction to the dye that is used to guide placement of the catheter.  What happens before the procedure? Staying hydrated Follow instructions from your health care provider about hydration, which may include:  Up to 2 hours before the procedure - you may continue to drink clear  liquids, such as water, clear fruit juice, black coffee, and plain tea.  Eating and drinking restrictions Follow instructions from your health care provider about eating and drinking, which may include:  8 hours before the procedure - stop eating heavy meals or foods such as meat, fried foods, or fatty foods.  6 hours before the procedure - stop eating light meals or foods, such as toast or cereal.  6 hours before the procedure - stop drinking milk or drinks that contain milk.  2 hours before the procedure - stop drinking clear liquids.  Medicines  Ask your health care provider about: ? Changing or stopping your normal medicines. This is especially important if you take diabetes medicines or blood thinners. ? Taking medicines such as aspirin and ibuprofen. These medicines can thin your blood. Do not take these medicines before your procedure if your doctor tells you not to. General instructions  You may have blood tests, such as tests that can show: ? How well your kidneys and liver are working. ? How well your blood can clot.  Plan to have someone take you home from the hospital or clinic. What happens during the procedure?  To reduce your risk of infection: ? Your health care team will wash or sanitize their hands. ? Your skin will be washed with soap.  An IV tube will be inserted into one of your veins. You may be given medicine through this tube to help prevent nausea, pain, or infection.  You will be given one or more of the following: ? A medicine to help you relax (sedative). ?  A medicine to make you fall asleep (general anesthetic).  The biliary drainage catheter will be inserted into a needle.  The needle will be inserted into your body and guided to your bile duct. A type of X-ray (fluoroscopy) will be used to guide the needle.  Depending on which type of biliary drainage catheter is inserted, the catheter may be advanced to your small intestine.  A dye will be  injected through the catheter. This will make your bile ducts easier to see in X-rays that will be taken.  The catheter will be left in place with a stitch (suture) that will be placed into your skin and around the tube. ? If an external or internal-external biliary drainage system is inserted, the catheter will exit your body and will be connected to a collection bag. ? If an internal biliary drainage system is inserted, the catheter exiting your body will remain in place for a short time until the location of the draining system is confirmed by X-ray. The catheter will be removed after the placement of the biliary drainage system is confirmed. What happens after the procedure?  Your blood pressure, heart rate, breathing rate, and blood oxygen level will be monitored until the medicines you were given have worn off.  You may have discomfort at the drainage site. You will be given pain medicines to control the pain.  You will need to remain lying down for several hours, or as told by your health care provider.  You will be instructed about how to empty and care for the drainage catheter and collection bag.  Do not drive for 24 hours if you were given a sedative. This information is not intended to replace advice given to you by your health care provider. Make sure you discuss any questions you have with your health care provider. Document Released: 10/08/2012 Document Revised: 11/07/2015 Document Reviewed: 11/07/2015 Elsevier Interactive Patient Education  2017 [place] Drainage Catheter Placement A biliary drainage catheter is a thin, flexible tube that is inserted through the skin into the bile ducts in the liver. This is sometimes called a percutaneous transhepatic biliary drainage catheter. Bile is a thick yellow or green fluid that helps digest fat in foods. The purpose of a biliary drainage catheter is to keep bile from backing up into the liver. Backup of bile can occur  when there is a blockage that prevents bile from moving from the bile ducts into the small intestine as it should. The blockage can be caused by gallstones, a tumor, or scar tissue.  There are three types of biliary drainage:  External biliary drainage. With this type, bile is only drained into a collection bag outside the body (external collection bag).  Internal-external biliary drainage. With this type, bile is drained to an outside collection bag as well as into the small intestine.  Internal biliary drainage. With this type, bile is only drained into the small intestine.  Tell a health care provider about:  Any allergies you have.  All medicines you are taking, including vitamins, herbs, eye drops, creams, and over-the-counter medicines.  Any problems you or family members have had with anesthetic medicines.  Any blood disorders you have.  Any surgeries you have had.  Any medical conditions you have.  Whether you are pregnant or may be pregnant. What are the risks? Generally, this is a safe procedure. However, problems may occur, including:  Bleeding.  Infection.  Damage to other structures or organs, such as  the liver, bile ducts, intestines, or stomach.  A bile leak.  Allergic reaction to the dye that is used to guide placement of the catheter.  What happens before the procedure? Staying hydrated Follow instructions from your health care provider about hydration, which may include:  Up to 2 hours before the procedure - you may continue to drink clear liquids, such as water, clear fruit juice, black coffee, and plain tea.  Eating and drinking restrictions Follow instructions from your health care provider about eating and drinking, which may include:  8 hours before the procedure - stop eating heavy meals or foods such as meat, fried foods, or fatty foods.  6 hours before the procedure - stop eating light meals or foods, such as toast or cereal.  6 hours before  the procedure - stop drinking milk or drinks that contain milk.  2 hours before the procedure - stop drinking clear liquids.  Medicines  Ask your health care provider about: ? Changing or stopping your normal medicines. This is especially important if you take diabetes medicines or blood thinners. ? Taking medicines such as aspirin and ibuprofen. These medicines can thin your blood. Do not take these medicines before your procedure if your doctor tells you not to. General instructions  You may have blood tests, such as tests that can show: ? How well your kidneys and liver are working. ? How well your blood can clot.  Plan to have someone take you home from the hospital or clinic. What happens during the procedure?  To reduce your risk of infection: ? Your health care team will wash or sanitize their hands. ? Your skin will be washed with soap.  An IV tube will be inserted into one of your veins. You may be given medicine through this tube to help prevent nausea, pain, or infection.  You will be given one or more of the following: ? A medicine to help you relax (sedative). ? A medicine to make you fall asleep (general anesthetic).  The biliary drainage catheter will be inserted into a needle.  The needle will be inserted into your body and guided to your bile duct. A type of X-ray (fluoroscopy) will be used to guide the needle.  Depending on which type of biliary drainage catheter is inserted, the catheter may be advanced to your small intestine.  A dye will be injected through the catheter. This will make your bile ducts easier to see in X-rays that will be taken.  The catheter will be left in place with a stitch (suture) that will be placed into your skin and around the tube. ? If an external or internal-external biliary drainage system is inserted, the catheter will exit your body and will be connected to a collection bag. ? If an internal biliary drainage system is inserted,  the catheter exiting your body will remain in place for a short time until the location of the draining system is confirmed by X-ray. The catheter will be removed after the placement of the biliary drainage system is confirmed. What happens after the procedure?  Your blood pressure, heart rate, breathing rate, and blood oxygen level will be monitored until the medicines you were given have worn off.  You may have discomfort at the drainage site. You will be given pain medicines to control the pain.  You will need to remain lying down for several hours, or as told by your health care provider.  You will be instructed about how to empty  and care for the drainage catheter and collection bag.  Do not drive for 24 hours if you were given a sedative. This information is not intended to replace advice given to you by your health care provider. Make sure you discuss any questions you have with your health care provider. Document Released: 10/08/2012 Document Revised: 11/07/2015 Document Reviewed: 11/07/2015 Elsevier Interactive Patient Education  2017 [place].   Biliary Drainage Catheter Home Guide A biliary drainage catheter is a thin, flexible tube that is inserted through your skin into the bile ducts in your liver. Bile is a thick yellow or green fluid that helps digest fat in foods. The purpose of a biliary drainage catheter is to keep bile from backing up into your liver. Backup of bile can occur when there is a blockage that prevents bile from moving from the bile ducts into the small intestine as it should. The blockage can be caused by gallstones, a tumor, or scar tissue. There are three types of biliary drainage:  External biliary drainage. With this type, bile is only drained into a collection bag outside your body (external collection bag).  Internal-external biliary drainage. With this type, bile is drained to an external collection bag as well as into your small  intestine.  Internal biliary drainage. With this type, bile is only drained into your small intestine.  General home care includes these daily actions:  Inspection of your drainage catheter.  Flushing your drainage catheter with saline.  Emptying drainage from the collection bag (if present).  Recording the amount of drainage.  Checking the catheter insertion site for signs of infection. Check for: ? Redness, swelling, or pain. ? Fluid or blood. ? Warmth. ? Pus or a bad smell. How do I inspect my drainage catheter?  Check the dressing to make sure that it is dry and clean.  Look at the skin around the drainage catheter when changing the dressing for any problems such as redness, rash, or skin breakdown.  Check the drainage bag to make sure that drainage fluid is flowing into the bag well. Note the color and amount compared to other days.  Check the drainage catheter and bag for any cracks or kinks in the tubing. How do I change my dressing? The dressing over the drainage catheter should be changed every other day, or more often if needed to keep the dressing dry. Your health care provider will instruct you about how often to change your dressing. Supplies needed:  Mild soap and warm water.  Split gauze pads, 4 x 4 inches (10 x 10 cm) to use as a dressing sponge.  Gauze pads, 4 x 4 inches (10 x 10 cm) or adhesive dressing cover.  Paper tape. How to change the dressing: 1. Wash your hands with soap and water. 2. Gently remove the old dressing. Avoid using scissors to remove the dressing because they may damage the drainage catheter. 3. Wash the skin around the insertion site with mild soap and warm water, rinse well, then pat the area dry with a clean cloth. 4. Check the skin around the drainage catheter for redness or swelling, or for yellow or green discharge that has a bad smell. 5. If the drainage catheter was stitched (sutured) to the skin, inspect the suture to make  sure it is still anchored in the skin. 6. Do not apply creams, ointments, or alcohol to the site. Allow the skin to air-dry completely before you apply a new dressing. 7. Place the drainage  catheter through the slit in a dressing sponge. The dressing sponge should slide under the disk that holds the drainage catheter in place. 8. Cover the drainage catheter and the dressing sponge with a 4 x 4 inch (10 x 10 cm) gauze. The drainage catheter should rest on the gauze and not on the skin. 9. Tape the dressing to the skin. 10. You may be instructed to use an adhesive dressing covering over the top of this in place of the gauze and tape. 11. Wash your hands with soap and water. How do I flush my drainage catheter? Biliary drainagecatheters should be flushed daily, or as often as told by your health care provider. The end of the drainage catheter is closed using an IV cap. A syringe can be directly connected to the IV cap. Supplies needed:  Alcohol swab.  10 mL prefilled normal saline syringe. How to flush the drainage catheter: 1. Wash your hands with soap and water. 2. If your drainage catheter has a stopcock attached to it, turn the stopcock toward the drainage bag. This will allow the saline to flow in the direction of your body. 3. Clean the IV cap with an alcohol swab. 4. Screw the tip of a 10 mL normal saline syringe onto the IV cap. 5. Inject the saline over 5-10 seconds. If you feel resistance while injecting, stop immediately. Avoid  pulling back on the plunger. Doing that could increase your risk of infection. 6. Remove the syringe from the cap. Turn the stopcock so that fluid flows from your body into the drainage bag. You may notice more fluid flowing into the bag after you have completed the flush. How do I attach a bag to my drainage catheter? If you are having trouble with your internal biliary drain, you may be directed by your health care provider to use bag drainage until you can be  seen to fix the problem. For this reason, you should always have a collection bag and connecting tubing at home. If you do not have these supplies, remember to ask for them at your next appointment. 1. Remove the bag and the connecting tubing from their packaging. 2. Connect the funnel end of the tubing to the bag's cone-shaped stem. 3. Remove the IV cap from the biliary drain. To do this, unscrew it and replace it with the screw-on end of the tubing. 4. Save the IV cap in a plastic storage bag that can be sealed.  How do I empty my collection bag? Empty the collection bag whenever it becomes 2/3 full. Also empty it before you go to sleep. Most collection bags have a drainage valve at the bottom so the bag can be that allows them to be emptied easily. 1. Wash your hands with soap and water. 2. Hold the collection bag over the toilet, basin, or collection container. Use a measuring container if your health care provider told you to measure the drainage. 3. Unscrew the valve to open it, and allow the bag to drain. 4. Close the valve securely to avoid leakage. 5. Use a tissue or disposable napkin to wipe the valve clean. 6. Wash the measuring container with soap and water. 7. Record the amount of drainage as told by your health care provider.  Contact a health care provider if:  Your pain gets worse after it had improved, and it is not relieved with pain medicines.  You have any questions about caring for your drainage catheter or collection bag.  You have  any of these around your catheter insertion site or coming from it: ? Skin breakdown. ? Redness, swelling, or pain. ? Fluid or blood. ? Warmth to the touch. ? Pus or a bad smell. Get help right away if:  You have a fever or chills.  Your redness, swelling, or pain at the catheter insertion site gets worse, even though you are cleaning it well.  You have leakage of bile around the drainage catheter.  Your drainage catheter becomes  blocked or clogged.  Your drainage catheter comes out. This information is not intended to replace advice given to you by your health care provider. Make sure you discuss any questions you have with your health care provider. Document Released: 10/08/2012 Document Revised: 11/07/2015 Document Reviewed: 11/07/2015 Elsevier Interactive Patient Education  2017 [employer].

## 2017-05-21 NOTE — Discharge Summary (Signed)
Discharge Summary  Dustin Lucas WPY:[id number] DOB: Mar 18, 1940  PCP: Benito Mccreedy, MD  Admit date: 05/16/2017 Discharge date: 05/21/2017  Time spent: 25 minutes   Recommendations for Outpatient Follow-up:  1. Follow up with Dr Benay Spice 2. Follow up with PCP 3. Take your medications as prescribed  Discharge Diagnoses:  Active Hospital Problems   Diagnosis Date Noted  . Protein-calorie malnutrition, severe 05/17/2017  . Cholestasis 05/16/2017  . Obstructive cholestatic liver disease 05/16/2017  . BPH with obstruction/lower urinary tract symptoms 12/18/2016  . Port catheter in place 12/12/2015  . Primary cancer of head of pancreas ([place]) 10/12/2015  . Essential hypertension, benign 08/21/2015  . Abnormal LFTs 08/18/2015    Resolved Hospital Problems  No resolved problems to display.    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 Per discussion with patient's gastroenterologist Dr. Therisa Doyne patient was noted to have severe stenosis of the pyloric channel with underlying inflammation circumferential ulceration and bleeding. She was unable to advance the gastroscope to the edema. Dr. Hulen Skains from interventional radiology was called for consideration of external biliary drain and patient is being admitted for this for observation.He currently reports  some diffuse abdominal pain and significant anorexia but denies any fevers, cough, congestion, nausea, vomiting, diarrhea. He does have significant early satiety.  05/17/17: seen and examined at his bedside. Has percutaneous drain on right side of abdomen. Has no new complaints. Pain is well controlled on current pain management.  RN reports fever with T-max of 101.  Portable chest x-ray and pan culture ordered.  Resume Zosyn empirically.  05/18/17: No new complaints. Right sided pain at site of drain is mild to moderate.   05/19/17: No new complaints.  05/20/17: No new complaints. Denies nausea or abd pain. T bili continues to trend down but still elevated at 6.3 from 7 from 11 on presentation.  05/21/17: No new complaints. Seen by his oncologist, Dr Benay Spice, who is ok with discharge. Spoke with IR Dr Kathlene Cote who is also ok with discharge. PT evaluated and recommends SNF. Patient/wife declined SNF and HHPT per CSW Meghan. On the day of discharge, the patient was hemodynamically stable and will need to f/u with his oncologist and PCP post hospitalization.  Hospital Course:  Active Problems:   Abnormal LFTs   Essential hypertension, benign   Primary cancer of head of pancreas ([place])   Port catheter in place   BPH with obstruction/lower urinary tract symptoms   Cholestasis   Obstructive cholestatic liver disease   Protein-calorie malnutrition, severe  Obstructive jaundice, stable POD#4 Due to pancreatic head cancer Migration of prior biliary stents Post IR internal/external biliary drain with cholangiogram  Monitor percutaneous drain output Oncology following  UTI, poa Urine culture done on 05/17/2017 revealed greater than 100,000 colonies of Klebsiella pneumoniae IV ceftriaxone switched to keflex bid.  Fever, resolved C/w keflex Most likely from UTI T-max 101 Chest x-ray  done on 05/18/2017 personally reviewed unremarkable Blood cultures x2- today Urine culture positive  for Klebsiella pneumoniae  Pancreatic cancer EUS in 2017 confirmed pancreatic head mass Post chemotherapy and radiation for locally advanced unresectable disease Resume chemotherapy once hyperbilirubinemia has resolved Follow-up outpatient on 05/23/2017  Severe protein calorie malnutrition Albumin 1.8 Continue oral supplement Encourage increase protein calorie intake  BPH, stable BPH with obstructive symptoms status post cystoscopy  Hx of duodenal ulcers, stable Continue PPI Continue home meds  Physical debility Fall precautions Out of bed to chair PT to evaluate    Procedures:  Percutaneous drain placement  Consultations: IR Oncology  Discharge Exam: BP 140/74 (BP Location: Right Arm)   Pulse 74   Temp 100.2 F (37.9 C) (Oral)   Resp 16   Ht 6\' 3"  (1.905 m)   Wt 62.5 kg (137 lb 12.6 oz)   SpO2 100%   BMI 17.22 kg/m  . General: 77 y.o. year-old male frail in NAD.  Alert and oriented x3. . Cardiovascular: Regular rate and rhythm with no rubs or gallops.  No thyromegaly or JVD noted.   Marland Kitchen Respiratory: Clear to auscultation with no wheezes or rales. Good inspiratory effort. . Abdomen: Soft nontender nondistended with normal bowel sounds x4 quadrants. . Musculoskeletal: No lower extremity edema. 2/4 pulses in all 4 extremities. . Skin: No ulcerative lesions noted or rashes, . Psychiatry: Mood is appropriate for condition and setting  Discharge Instructions You were cared for by a hospitalist during your hospital stay. If you have any questions about your discharge medications or the care you received while you were in the hospital after you are discharged, you can call the unit and asked to speak with the hospitalist on call if the hospitalist that took care of you is not available. Once you are discharged, your primary care physician will handle any further medical issues. Please note that NO REFILLS for any discharge medications will be authorized once you are  discharged, as it is imperative that you return to your primary care physician (or establish a relationship with a primary care physician if you do not have one) for your aftercare needs so that they can reassess your need for medications and monitor your lab values.  Discharge Instructions    DME Wheelchair electric   Complete by:  As directed    Wheelchair with cushions as recommended by PT   Face-to-face encounter (required for Medicare/Medicaid patients)   Complete by:  As directed    I Kayleen Memos certify that this patient is under my care and that I, or a nurse practitioner or physician's assistant working with me, had a face-to-face encounter that meets the physician face-to-face encounter requirements with this patient on 05/21/2017. The encounter with the patient was in whole, or in part for the following medical condition(s) which is the primary reason for home health care (List medical condition): Complex wound care.   The encounter with the patient was in whole, or in part, for the following medical condition, which is the primary reason for home health care:  Complex wound dressing and management of percutaneous drain   I certify that, based on my findings, the following services are medically necessary home health services:  Nursing   Reason for Medically Necessary Home Health Services:  Skilled Nursing- Complex Wound Care   My clinical findings support the need for the above services:  Unsafe ambulation due to balance issues   Further, I certify that my clinical findings support that  this patient is homebound due to:  Pain interferes with ambulation/mobility   Home Health   Complete by:  As directed    To provide the following care/treatments:  RN     Allergies as of 05/21/2017      Reactions   Shellfish Allergy Itching      Medication List    STOP taking these medications   ibuprofen 200 MG tablet Commonly known as:  ADVIL,MOTRIN   Potassium 99 MG Tabs     TAKE these  medications   cephALEXin 500 MG capsule Commonly known as:  KEFLEX Take 1 capsule (500 mg total) by mouth 2 (two) times daily for 10 days.   cholecalciferol 1000 units tablet Commonly known as:  VITAMIN D Take 2,000 Units by mouth daily.   famotidine 20 MG tablet Commonly known as:  PEPCID Take 1 tablet (20 mg total) by mouth 2 (two) times daily for 14 days.   lactose free nutrition Liqd Take 237 mLs by mouth 2 (two) times daily between meals.   loperamide 2 MG tablet Commonly known as:  IMODIUM A-D Take 2 mg by mouth 4 (four) times daily as needed for diarrhea or loose stools.   oxyCODONE-acetaminophen 5-325 MG tablet Commonly known as:  PERCOCET/ROXICET Take 0.5-1 tablets by mouth every 4 (four) hours as needed for severe pain. What changed:  how much to take   pantoprazole 40 MG tablet Commonly known as:  PROTONIX Take 40 mg by mouth 2 (two) times daily.            Durable Medical Equipment  (From admission, onward)        Start     Ordered   05/21/17 1402  For home use only DME Hospital bed  Once    Question Answer Comment  Patient has (list medical condition): pancreatic cancer   The above medical condition requires: Patient requires the ability to reposition frequently   Bed type Semi-electric      05/21/17 1401   05/21/17 0000  DME Wheelchair electric    Comments:  Wheelchair with cushions as recommended by PT   05/21/17 1443     Allergies  Allergen Reactions  . Shellfish Allergy Itching   Follow-up Information    [name], Iona Beard, MD. Call in 1 day(s).   Specialty:  Internal Medicine Why:  please call and make appointment Contact information: [address] [place] [address] [phone]        Ladell Pier, MD. Call in 1 day(s).   Specialty:  Oncology Why:  please call to make appointment Contact information: [place]  [phone]            The results of  significant diagnostics from this hospitalization (including imaging, microbiology, ancillary and laboratory) are listed below for reference.    Significant Diagnostic Studies: Dg Chest Port 1 View  Result Date: 05/17/2017 CLINICAL DATA:  Abdominal pain, fever and shortness of breath. Anorexia. EXAM: PORTABLE CHEST 1 VIEW COMPARISON:  CT chest April 15, 2017 FINDINGS: Cardiomediastinal silhouette is normal. Trace calcific atherosclerosis aortic arch. No pleural effusions or focal consolidations. Trachea projects midline and there is no pneumothorax. Soft tissue planes and included osseous structures are non-suspicious. Single lumen RIGHT chest Port-A-Cath distal tip projects the cavoatrial junction. Stent in upper abdomen. IMPRESSION: No active disease. Aortic Atherosclerosis (ICD10-I70.0). Electronically Signed   By: Elon Alas M.D.   On: 05/17/2017 18:33   Ir Int Lianne Cure Biliary Drain  With Cholangiogram  Result Date: 05/17/2017 INDICATION: Pancreatic carcinoma and biliary obstruction with inability to replace a biliary stent endoscopically in the common bile duct. Percutaneous biliary drainage now needed to relieve biliary obstruction and jaundice. EXAM: PERCUTANEOUS BILIARY DRAINAGE WITH PLACEMENT OF INTERNAL/EXTERNAL BILIARY DRAINAGE CATHETER INCLUDING CHOLANGIOGRAM MEDICATIONS: 3.375 g IV Zosyn; The antibiotic was administered within an appropriate time frame prior to the initiation of the procedure. ANESTHESIA/SEDATION: Moderate (conscious) sedation was employed during this procedure. A total of Versed 2.0 mg and Fentanyl 100 mcg was administered intravenously. Moderate Sedation Time: 24 minutes. The patient's level of consciousness and vital signs were monitored continuously by radiology nursing throughout the procedure under my direct supervision. FLUOROSCOPY TIME:  Fluoroscopy Time: 3.0 minutes.  32.7 mGy. CONTRAST:  20 mL [id number] COMPLICATIONS: None immediate. PROCEDURE: Informed written  consent was obtained from the patient after a thorough discussion of the procedural risks, benefits and alternatives. All questions were addressed. Maximal Sterile Barrier Technique was utilized including caps, mask, sterile gowns, sterile gloves, sterile drape, hand hygiene and skin antiseptic. A timeout was performed prior to the initiation of the procedure. Ultrasound was performed of the liver. Bile ducts in the right lobe were localized by ultrasound. Under direct ultrasound guidance, a 21 gauge needle was advanced into a peripheral right lobe bile duct. After return of bile, contrast was injected and a cholangiographic fluoroscopic image obtained. A guidewire was advanced through the needle. A transitional dilator was placed. Additional contrast was injected at the level of the common bile duct through the dilator. The dilator was removed over a wire. A 5 French catheter was advanced over the wire. Over a hydrophilic guidewire, the 5 French catheter was advanced through a common bile duct stricture to the level of the duodenum. The tract was then dilated over a guidewire and a 10 French internal/external biliary drainage catheter advanced over the wire. The distal portion of the catheter was formed at the level of the duodenum. The catheter was injected with contrast material to confirm position and flushed. The catheter was then connected to a gravity drainage bag. It was secured at the skin with a Prolene retention suture and adhesive StatLock device. FINDINGS: After access of a right lobe bile duct, contrast injection and cholangiogram demonstrate high-grade obstruction of the biliary tree at the level of the common bile duct. 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 The stricture was able to be crossed with a guidewire allowing eventual placement of a 10 French internal/external biliary drainage catheter. After placement, the catheter is draining bile with some internal debris.  IMPRESSION: Percutaneous biliary drainage procedure with placement of 10 French internal/external biliary drainage catheter after right lobe biliary access. 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 The biliary drainage catheter was attached to gravity bag drainage. Electronically Signed   By: Aletta Edouard M.D.   On: 05/17/2017 14:57    Microbiology: Recent Results (from the past 240 hour(s))  Culture, blood (routine x 2)     Status: None (Preliminary result)   Collection Time: 05/17/17  4:48 PM  Result Value Ref Range Status   Specimen Description   Final    BLOOD LEFT ARM Performed at [facility], [place] [address], [place], [place] [address]    Special Requests   Final    BOTTLES DRAWN AEROBIC AND ANAEROBIC Blood Culture adequate volume Performed at [facility],  [place] [address], [place], [place] [address]    Culture   Final    NO GROWTH 4 DAYS Performed at [facility], [place] [address] [place], [place] [address]    Report Status PENDING  Incomplete  Culture, blood (routine x 2)     Status: None (Preliminary result)   Collection Time: 05/17/17  4:49 PM  Result Value Ref Range Status   Specimen Description   Final    BLOOD RIGHT ANTECUBITAL Performed at [facility] [address], [place], [place] [address]    Special Requests   Final    BOTTLES DRAWN AEROBIC AND ANAEROBIC Blood Culture adequate volume Performed at [facility] [address] [place], [place] [address]    Culture   Final    NO GROWTH 4 DAYS Performed at [facility], [place] [address] [place], [place] [address]    Report Status PENDING  Incomplete  Culture, Urine     Status: Abnormal   Collection Time: 05/17/17  8:40 PM  Result Value Ref Range Status   Specimen Description   Final    URINE, CLEAN CATCH Performed at [facility], [place] [address] [place], [place] [address]    Special Requests   Final    NONE Performed at [facility], [place] [address], [place], [place] [address]    Culture >=100,000 COLONIES/mL KLEBSIELLA PNEUMONIAE (A)  Final   Report Status 05/20/2017 FINAL  Final   Organism ID, Bacteria KLEBSIELLA PNEUMONIAE (A)  Final      Susceptibility   Klebsiella pneumoniae - MIC*    AMPICILLIN RESISTANT Resistant     CEFAZOLIN <=4 SENSITIVE Sensitive     CEFTRIAXONE <=1 SENSITIVE Sensitive     CIPROFLOXACIN 2 INTERMEDIATE Intermediate     GENTAMICIN <=1 SENSITIVE Sensitive     IMIPENEM <=0.25 SENSITIVE Sensitive     NITROFURANTOIN 64 INTERMEDIATE Intermediate     TRIMETH/SULFA <=20 SENSITIVE Sensitive     AMPICILLIN/SULBACTAM <=2 SENSITIVE Sensitive     PIP/TAZO <=4 SENSITIVE Sensitive     Extended ESBL NEGATIVE Sensitive     * >=100,000 COLONIES/mL KLEBSIELLA PNEUMONIAE  Body fluid culture     Status: None (Preliminary result)   Collection Time: 05/19/17  1:29 AM  Result Value Ref Range Status   Specimen Description   Final    BILE Performed at [facility] [address], [place], [place] [address]    Special Requests   Final    NONE Performed at [facility], [place] [address], [place], [place] [address]    Gram Stain   Final    FEW WBC PRESENT, PREDOMINANTLY PMN ABUNDANT GRAM POSITIVE RODS RARE GRAM POSITIVE COCCI RARE BUDDING YEAST SEEN Performed at [facility], [place] [address], [place], [place] [address]    Culture ABUNDANT GRAM NEGATIVE RODS  Final   Report Status PENDING  Incomplete     Labs: Basic Metabolic Panel: Recent Labs  Lab 05/17/17 0412 05/18/17 0430 05/20/17 0500  NA 140 143 139  K 3.6 4.0 3.6  CL 107 108 105  CO2 23 25 24   GLUCOSE 112* 106* 91  BUN 14 13 14   CREATININE 0.65 0.66 0.66  CALCIUM 8.5* 8.6* 8.4*   Liver Function Tests: Recent Labs  Lab 05/17/17 0412  05/19/17 0500 05/20/17 1425 05/21/17 0818  AST 99* 43*  --  50*  ALT 84* 56  --  49  ALKPHOS 161* 129*  --  122  BILITOT 11.0* 7.6* 6.3* 5.9*  PROT 6.4* 6.2*  --  6.5  ALBUMIN 2.0* 1.8*  --  1.8*   No results for input(s): LIPASE, AMYLASE in the last 168 hours. No results for input(s): AMMONIA in the last 168 hours. CBC: Recent Labs  Lab 05/17/17 0412 05/18/17 0430 05/20/17 0500  WBC 5.4 6.9 6.8  NEUTROABS 4.0  --   --   HGB 10.4* 10.1* 10.1*  HCT 29.9* 29.6* 28.7*  MCV 91.2 92.2 90.5  PLT 218 208 246   Cardiac Enzymes: No results for input(s): CKTOTAL, CKMB, CKMBINDEX, TROPONINI in the last 168 hours. BNP: BNP (last 3 results) No results for input(s): BNP in the last 8760 hours.  ProBNP (last 3 results) No results for input(s): PROBNP in the last 8760 hours.  CBG: No results for input(s): GLUCAP in the last 168 hours.     Signed:  Kayleen Memos, MD [facility] 05/21/2017, 2:44 PM

## 2017-05-21 NOTE — Progress Notes (Signed)
Student Nurse went over discharged papers with patient, give to patient and wife and both verbalized understanding.

## 2017-05-21 NOTE — Progress Notes (Signed)
Referring Physician(s): Karki,A/Sherrill,B  Supervising Physician: Aletta Edouard  Patient Status:  [facility] - In-pt  Chief Complaint:  Obstructive jaundice, pancreatic cancer  Subjective: Pt stable; still has some abd discomfort but no sig worsening or N/V  Allergies: Shellfish allergy  Medications: Prior to Admission medications   Medication Sig Start Date End Date Taking? Authorizing Provider  cholecalciferol (VITAMIN D) 1000 units tablet Take 2,000 Units by mouth daily.   Yes [provider]  ibuprofen (ADVIL,MOTRIN) 200 MG tablet Take 200 mg by mouth every 6 (six) hours as needed for fever.   Yes [provider]  lactose free nutrition (BOOST) LIQD Take 237 mLs by mouth 2 (two) times daily between meals.   Yes [provider]  oxyCODONE-acetaminophen (PERCOCET/ROXICET) 5-325 MG tablet Take 0.5-1 tablets by mouth every 4 (four) hours as needed for severe pain. Patient taking differently: Take 1 tablet by mouth every 4 (four) hours as needed for severe pain.  05/13/17  Yes Owens Shark, NP  pantoprazole (PROTONIX) 40 MG tablet Take 40 mg by mouth 2 (two) times daily.  05/15/17  Yes [provider]  Potassium 99 MG TABS Take 99 mg by mouth daily.   Yes [provider]  loperamide (IMODIUM A-D) 2 MG tablet Take 2 mg by mouth 4 (four) times daily as needed for diarrhea or loose stools.    [provider]     Vital Signs: BP 140/74 (BP Location: Right Arm)   Pulse 74   Temp 100.2 F (37.9 C) (Oral)   Resp 16   Ht 6\' 3"  (1.905 m)   Wt 137 lb 12.6 oz (62.5 kg)   SpO2 100%   BMI 17.22 kg/m   Physical Exam biliary drain intact, dressing dry, site mildly tender, output 200 cc green bile; drain flushed without difficulty  Imaging: Dg Chest Port 1 View  Result Date: 05/17/2017 CLINICAL DATA:  Abdominal pain, fever and shortness of breath. Anorexia. EXAM: PORTABLE CHEST 1 VIEW COMPARISON:  CT chest April 15, 2017  FINDINGS: Cardiomediastinal silhouette is normal. Trace calcific atherosclerosis aortic arch. No pleural effusions or focal consolidations. Trachea projects midline and there is no pneumothorax. Soft tissue planes and included osseous structures are non-suspicious. Single lumen RIGHT chest Port-A-Cath distal tip projects the cavoatrial junction. Stent in upper abdomen. IMPRESSION: No active disease. Aortic Atherosclerosis (ICD10-I70.0). Electronically Signed   By: Elon Alas M.D.   On: 05/17/2017 18:33    Labs:  CBC: Recent Labs    05/09/17 1155 05/17/17 0412 05/18/17 0430 05/20/17 0500  WBC 5.8 5.4 6.9 6.8  HGB 12.6* 10.4* 10.1* 10.1*  HCT 37.4* 29.9* 29.6* 28.7*  PLT 171 218 208 246    COAGS: Recent Labs    05/17/17 0412  INR 1.33    BMP: Recent Labs    05/09/17 1155 05/17/17 0412 05/18/17 0430 05/20/17 0500  NA 140 140 143 139  K 3.5 3.6 4.0 3.6  CL 106 107 108 105  CO2 26 23 25 24   GLUCOSE 101 112* 106* 91  BUN 15 14 13 14   CALCIUM 9.7 8.5* 8.6* 8.4*  CREATININE 0.80 0.65 0.66 0.66  GFRNONAA >60 >60 >60 >60  GFRAA >60 >60 >60 >60    LIVER FUNCTION TESTS: Recent Labs    05/09/17 1155 05/17/17 0412 05/19/17 0500 05/20/17 1425 05/21/17 0818  BILITOT 5.1* 11.0* 7.6* 6.3* 5.9*  AST 46* 99* 43*  --  50*  ALT 74* 84* 56  --  49  ALKPHOS 227* 161* 129*  --  122  PROT 7.5 6.4* 6.2*  --  6.5  ALBUMIN 2.5* 2.0* 1.8*  --  1.8*    Assessment and Plan: Pt with hx panc ca/biliary- distal CBD obstruction; s/p I/E biliary drain placement 5/17; temp 100.2; t bili 5.9(6.3), bile cx- gm neg rods, urine cx- klebsiella; bile cytology with atypical cells; antbx per primary team;cont current tx- rec once daily irrigation of biliary drain as OP with dressing changes prn and output monitoring; flush technique reviewed with pt's wife and she is comfortable performing task; pt will be scheduled for f/u drain injection in 4-6 weeks; IR contact number given to pt for any  drain related questions; pt should maintain adequate hydration while drain in place     Electronically Signed: D. Rowe Robert, PA-C 05/21/2017, 2:30 PM   I spent a total of 15 minutes at the the patient's bedside AND on the patient's hospital floor or unit, greater than 50% of which was counseling/coordinating care for biliary drain    Patient ID: Dustin Lucas, male   DOB: 1940-08-27, 77 y.o.   MRN: [id number]

## 2017-05-21 NOTE — Progress Notes (Signed)
IP PROGRESS NOTE  Subjective:   Dustin Lucas reports an improved appetite after undergoing placement of an internal/external biliary drain on 05/17/2017.  No nausea.  He has mild abdominal discomfort.  Objective: Vital signs in last 24 hours: Blood pressure (!) 150/81, pulse 71, temperature 99.2 F (37.3 C), temperature source Oral, resp. rate 16, height 6\' 3"  (1.905 m), weight 137 lb 12.6 oz (62.5 kg), SpO2 100 %.  Intake/Output from previous day: 05/20 0701 - 05/21 0700 In: 5 [I.V.:5] Out: 750 [Urine:400; Drains:350]  Physical Exam:  HEENT: Mild white coat over the tongue Lungs: Clear anteriorly Cardiac: Regular rate and rhythm Abdomen: Nontender, no mass, no hepatomegaly Extremities: No leg edema   Portacath/PICC-without erythema  Lab Results: Recent Labs    05/20/17 0500  WBC 6.8  HGB 10.1*  HCT 28.7*  PLT 246    BMET Recent Labs    05/20/17 0500  NA 139  K 3.6  CL 105  CO2 24  GLUCOSE 91  BUN 14  CREATININE 0.66  CALCIUM 8.4*   05/20/2017: Bilirubin-6.3  Medications: I have reviewed the patient's current medications.  Assessment/Plan: 1. Pancreas cancer-presenting with obstructive jaundice  CT 08/18/2015 with intra-and extrahepatic Dillard dilatation and dilatation of the pancreas duct, vague fullness at the head of the pancreas  EUS 10/05/2015 confirmed a pancreas head mass with no evidence of vascular invasion and no peripancreatic adenopathy (uT2,uN0)  EUS biopsy of the pancreas head mass on 10/05/2015 confirmed adenocarcinoma  CT chest/abdomen at [facility] 11/16/2015-hypoenhancing mass in the head and uncinate process of the pancreas with increased markedpancreatic ductal dilatation and greater than 180 encasement of the superior mesenteric artery and superior mesenteric vein above the level of the first tributary.  Cycle 1 FOLFOX 12/12/2015  Cycle 2 FOLFOX 12/27/2015  Cycle 3 FOLFOX 01/24/2016-Neulasta added  Cycle 4 FOLFOX  02/14/2016  Restaging CT scans 03/02/2016-decreasing size of pancreatic head/uncinate process mass with decreased encasement of the SMV (less than 180) and decreased pancreatic ductal dilatation. There remains greater than 180 of encasement of the SMA which is patent. Unchanged position of colon bile duct stent with decreasing right-sided pneumobilia. Stable 6 mm right upper lobe nodule.  Cycle 5 FOLFOX 03/13/2016 (oxaliplatin on hold due to poor tolerance)  Cycle 6 FOLFOX 03/27/2016 (oxaliplatin held)  Cycle 7 FOLFOX 04/10/2016 (oxaliplatin held)  Cycle 8 FOLFOX 04/24/2016 (oxaliplatin held)  MRI of the abdomen at [facility] on 05/23/2016 showed unchanged pancreatic head mass as compared to 05/02/2016 CT with similar encasement of the SMA and abutment/encasement of the SMV. No evidence of metastatic disease in the upper abdomen.   He completed a course of radiation delivered over 5 fractions on 06/18/2016 at [facility]  MRI abdomen at [facility] 10/30/2016-mildincrease in size of the pancreas head mass with encasement of the SMA and SMV, no evidence of metastatic disease  CT chest, abdomen, pelvis 04/15/2017- mass at the medial pancreas head/uncinate 8 with encasement of the SMA, mild to moderate intrahepatic and extrahepatic ductal dilatation  CTs at [facility] 04/30/2017- slight increased size of the pancreas head mass, encasement of the SMA, increase compression of the portal confluence and narrowing/occlusion of the superior mesenteric vein, no new site of disease  2. Obstructive jaundice secondary to #1-status postplacement of a bile duct stent 10/05/2015  Recurrent obstructive jaundice May 2019  Aborted attempted ERCP 05/15/2017 secondary to stenosis/ulceration at the pyloric channel  Internal/external biliary drain placed 05/17/2017   3. Hypertension  4. Anorexia/weight loss secondary to #1  5. 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  8.    Klebsiella urinary tract infection 05/17/2017   Mr. Dustin Lucas appears stable.  The bilirubin remains elevated after placement of the internal-external drain.  I will check a bilirubin level today.  The plan is to initiate gemcitabine/Abraxane chemotherapy once the hyperbilirubinemia has resolved.  He has a low-grade fever, potentially related to the biliary drain, UTI, or tumor fever.  Blood cultures are negative to date.  He appears stable for discharge from an oncology standpoint. Recommendations: 1.  Liver panel today 2.  Outpatient follow-up at the Cancer center is scheduled for 05/23/2017, chemotherapy will be  delayed if the hyperbilirubinemia has not resolved. 3.  Complete course of antibiotics for the Klebsiella positive urine culture 4.  Management of biliary drain per interventional radiology    LOS: 0 days   Betsy Coder, MD   05/21/2017, 7:31 AM

## 2017-05-22 ENCOUNTER — Other Ambulatory Visit: Payer: Self-pay | Admitting: Nurse Practitioner

## 2017-05-22 ENCOUNTER — Other Ambulatory Visit: Payer: Self-pay

## 2017-05-22 DIAGNOSIS — C250 Malignant neoplasm of head of pancreas: Secondary | ICD-10-CM

## 2017-05-22 LAB — CULTURE, BLOOD (ROUTINE X 2)
Culture: NO GROWTH
Culture: NO GROWTH
SPECIAL REQUESTS: ADEQUATE
Special Requests: ADEQUATE

## 2017-05-23 ENCOUNTER — Inpatient Hospital Stay: Payer: Medicare Other

## 2017-05-23 ENCOUNTER — Inpatient Hospital Stay: Payer: Medicare Other | Admitting: Nutrition

## 2017-05-23 ENCOUNTER — Telehealth: Payer: Self-pay | Admitting: Nurse Practitioner

## 2017-05-23 ENCOUNTER — Encounter: Payer: Self-pay | Admitting: Dietician

## 2017-05-23 ENCOUNTER — Other Ambulatory Visit: Payer: Self-pay | Admitting: Nurse Practitioner

## 2017-05-23 ENCOUNTER — Other Ambulatory Visit: Payer: Self-pay

## 2017-05-23 ENCOUNTER — Encounter: Payer: Self-pay | Admitting: Nurse Practitioner

## 2017-05-23 ENCOUNTER — Inpatient Hospital Stay (HOSPITAL_BASED_OUTPATIENT_CLINIC_OR_DEPARTMENT_OTHER): Payer: Medicare Other | Admitting: Nurse Practitioner

## 2017-05-23 VITALS — BP 105/71 | HR 106 | Temp 98.8°F | Resp 18 | Ht 75.0 in

## 2017-05-23 DIAGNOSIS — N390 Urinary tract infection, site not specified: Secondary | ICD-10-CM | POA: Diagnosis not present

## 2017-05-23 DIAGNOSIS — C250 Malignant neoplasm of head of pancreas: Secondary | ICD-10-CM

## 2017-05-23 DIAGNOSIS — B370 Candidal stomatitis: Secondary | ICD-10-CM | POA: Diagnosis not present

## 2017-05-23 DIAGNOSIS — B961 Klebsiella pneumoniae [K. pneumoniae] as the cause of diseases classified elsewhere: Secondary | ICD-10-CM

## 2017-05-23 DIAGNOSIS — Z95828 Presence of other vascular implants and grafts: Secondary | ICD-10-CM

## 2017-05-23 LAB — CMP (CANCER CENTER ONLY)
ALBUMIN: 2.1 g/dL — AB (ref 3.5–5.0)
ALT: 61 U/L — ABNORMAL HIGH (ref 0–55)
ANION GAP: 10 (ref 3–11)
AST: 74 U/L — AB (ref 5–34)
Alkaline Phosphatase: 141 U/L (ref 40–150)
BILIRUBIN TOTAL: 4.5 mg/dL — AB (ref 0.2–1.2)
BUN: 13 mg/dL (ref 7–26)
CO2: 25 mmol/L (ref 22–29)
Calcium: 9.2 mg/dL (ref 8.4–10.4)
Chloride: 102 mmol/L (ref 98–109)
Creatinine: 0.74 mg/dL (ref 0.70–1.30)
GFR, Est AFR Am: >60 mL/min (ref >60)
GFR, Estimated: >60 mL/min (ref >60)
GLUCOSE: 101 mg/dL (ref 70–140)
POTASSIUM: 3.8 mmol/L (ref 3.5–5.1)
SODIUM: 137 mmol/L (ref 136–145)
TOTAL PROTEIN: 7.6 g/dL (ref 6.4–8.3)

## 2017-05-23 LAB — CBC WITH DIFFERENTIAL (CANCER CENTER ONLY)
BASOS ABS: 0 10*3/uL (ref 0.0–0.1)
Basophils Relative: 0 %
EOS PCT: 0 %
Eosinophils Absolute: 0 10*3/uL (ref 0.0–0.5)
HEMATOCRIT: 34.8 % — AB (ref 38.4–49.9)
Hemoglobin: 11.9 g/dL — ABNORMAL LOW (ref 13.0–17.1)
LYMPHS ABS: 1.1 10*3/uL (ref 0.9–3.3)
LYMPHS PCT: 17 %
MCH: 31.9 pg (ref 27.2–33.4)
MCHC: 34.2 g/dL (ref 32.0–36.0)
MCV: 93.3 fL (ref 79.3–98.0)
MONO ABS: 0.5 10*3/uL (ref 0.1–0.9)
Monocytes Relative: 7 %
NEUTROS ABS: 5.1 10*3/uL (ref 1.5–6.5)
Neutrophils Relative %: 76 %
PLATELETS: 307 10*3/uL (ref 140–400)
RBC: 3.73 MIL/uL — ABNORMAL LOW (ref 4.20–5.82)
RDW: 15.8 % — AB (ref 11.0–14.6)
WBC Count: 6.8 10*3/uL (ref 4.0–10.3)

## 2017-05-23 LAB — BODY FLUID CULTURE

## 2017-05-23 MED ORDER — FLUCONAZOLE 50 MG PO TABS: 50.0000 mg | ORAL_TABLET | Freq: Every day | ORAL | 0 refills | Status: DC

## 2017-05-23 MED ORDER — LEVOFLOXACIN 500 MG PO TABS: 500.0000 mg | ORAL_TABLET | Freq: Every day | ORAL | 0 refills | Status: DC

## 2017-05-23 MED ORDER — SODIUM CHLORIDE 0.9% FLUSH
10.0000 mL | INTRAVENOUS | Status: DC | PRN
  Administered 2017-05-23: 10 mL via INTRAVENOUS
  Filled 2017-05-23: qty 10

## 2017-05-23 MED ORDER — DRONABINOL 2.5 MG PO CAPS: 2.5000 mg | ORAL_CAPSULE | Freq: Two times a day (BID) | ORAL | 0 refills | Status: DC

## 2017-05-23 MED ORDER — HEPARIN SOD (PORK) LOCK FLUSH 100 UNIT/ML IV SOLN
500.0000 [IU] | Freq: Once | INTRAVENOUS | Status: AC | PRN
  Administered 2017-05-23: 500 [IU] via INTRAVENOUS
  Filled 2017-05-23: qty 5

## 2017-05-23 NOTE — Telephone Encounter (Signed)
I contacted Mr. Howk wife that we are sending a prescription to his pharmacy for Levaquin 500 mg daily x7 days due to the enterobacter in the bile culture not being sensitive to keflex.

## 2017-05-23 NOTE — Telephone Encounter (Signed)
No 5/23 los -per Mayra Neer pt to f/u next week .

## 2017-05-23 NOTE — Progress Notes (Signed)
Brief Nutrition Note  Pt's infusion and appointments were cancelled today per MD. Unable to complete assessment as pt was not present. Will follow up as able.   Gaynell Face, MS, RD, LDN Pager: [phone] Weekend/After Hours: [phone]

## 2017-05-23 NOTE — Progress Notes (Addendum)
[place] OFFICE PROGRESS NOTE   Diagnosis: Pancreas cancer  INTERVAL HISTORY:   Dustin Lucas returns as scheduled.  He was found to have recurrent obstructive jaundice earlier this month.  ERCP attempted 05/15/2017 but was aborted secondary to stenosis/ulceration of the pyloric channel.  He was hospitalized 05/16/2017.  An internal/external biliary drain was placed 05/17/2017.  He was diagnosed with a Klebsiella urinary tract infection during the hospitalization.  Bile culture culture positive for Klebsiella and Enterobacter.  He was discharged home 05/21/2017 on Keflex.  He has had no fever since discharge home.  He is weak.  He denies nausea.  Wife reports appetite is poor,  interested in a trial of Marinol for appetite stimulation.  No further diarrhea.  He had a normal bowel movement today.  He denies pain.  Objective:  Vital signs in last 24 hours:  Blood pressure 105/71, pulse (!) 106, temperature 98.8 F (37.1 C), temperature source Oral, resp. rate 18, height 6\' 3"  (1.905 m), SpO2 100 %.    HEENT: Mild thrush.  Mild scleral icterus. Resp: Lungs clear bilaterally. Cardio: Regular rate and rhythm. GI: Abdomen soft and nontender.  No hepatomegaly.  Right upper quadrant drain site is covered with a gauze bandage. Vascular: No leg edema. Port-A-Cath without erythema.  Lab Results:  Lab Results  Component Value Date   WBC 6.8 05/20/2017   HGB 10.1 (L) 05/20/2017   HCT 28.7 (L) 05/20/2017   MCV 90.5 05/20/2017   PLT 246 05/20/2017   NEUTROABS 4.0 05/17/2017    Imaging:  No results found.  Medications: I have reviewed the patient's current medications.  Assessment/Plan: 1. Pancreas cancer-presenting with obstructive jaundice  CT 08/18/2015 with intra-and extrahepatic Dillard dilatation and dilatation of the pancreas duct, vague fullness at the head of the pancreas  EUS 10/05/2015 confirmed a pancreas head mass with no evidence of vascular invasion and  no peripancreatic adenopathy (uT2,uN0)  EUS biopsy of the pancreas head mass on 10/05/2015 confirmed adenocarcinoma  CT chest/abdomen at [facility] 11/16/2015-hypoenhancing mass in the head and uncinate process of the pancreas with increased markedpancreatic ductal dilatation and greater than 180 encasement of the superior mesenteric artery and superior mesenteric vein above the level of the first tributary.  Cycle 1 FOLFOX 12/12/2015  Cycle 2 FOLFOX 12/27/2015  Cycle 3 FOLFOX 01/24/2016-Neulasta added  Cycle 4 FOLFOX 02/14/2016  Restaging CT scans 03/02/2016-decreasing size of pancreatic head/uncinate process mass with decreased encasement of the SMV (less than 180) and decreased pancreatic ductal dilatation. There remains greater than 180 of encasement of the SMA which is patent. Unchanged position of colon bile duct stent with decreasing right-sided pneumobilia. Stable 6 mm right upper lobe nodule.  Cycle 5 FOLFOX 03/13/2016 (oxaliplatin on hold due to poor tolerance)  Cycle 6 FOLFOX 03/27/2016 (oxaliplatin held)  Cycle 7 FOLFOX 04/10/2016 (oxaliplatin held)  Cycle 8 FOLFOX 04/24/2016 (oxaliplatin held)  MRI of the abdomen at [facility] on 05/23/2016 showed unchanged pancreatic head mass as compared to 05/02/2016 CT with similar encasement of the SMA and abutment/encasement of the SMV. No evidence of metastatic disease in the upper abdomen.   He completed a course of radiation delivered over 5 fractions on 06/18/2016 at [facility].  MRI abdomen at [facility] 10/30/2016-mildincrease in size of the pancreas head mass with encasement of the SMA and SMV, no evidence of metastatic disease  CT chest, abdomen, pelvis 04/15/2017- mass at the medial pancreas head/uncinate 8 with encasement of the SMA, mild to moderate intrahepatic and extrahepatic ductal dilatation  CTs at [facility] 04/30/2017- slight increased size of the pancreas head mass, encasement of the SMA, increase compression of the portal confluence and  narrowing/occlusion of the superior mesenteric vein, no new site of disease  2. Obstructive jaundice secondary to #1-status postplacement of a bile duct stent 10/05/2015  Recurrent obstructive jaundice May 2019  Aborted attempted ERCP 05/15/2017 secondary to stenosis/ulceration at the pyloric channel  Internal/external biliary drain placed 05/17/2017   3. Hypertension  4. Anorexia/weight loss secondary to #1.  Trial of Marinol initiated 05/23/2017.  5. 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  8.    Klebsiella urinary tract infection 05/17/2017   Disposition: Mr. Sprinkle appears unchanged.  Chemotherapy is currently on hold pending improvement in the bilirubin.  Repeat labs from today are pending.  For appetite stimulation he will begin a trial of Marinol 2.5 mg twice daily.  We discussed the potential for drowsiness, vivid dreams, hallucinations.  He has mild oral candidiasis on exam today.  He will complete a course of Diflucan 50 mg daily for 3 days.  He is scheduled to return for lab, follow-up and possible chemotherapy 05/28/2017.  We will adjust accordingly pending the lab results from today.   Patient seen with Dr. Benay Spice.   Ned Card ANP/GNP-BC   05/23/2017  11:11 AM This was a shared visit with Ned Card.  Mr. Bahe appears stable.  The bilirubin remains elevated.  Gemcitabine/Abraxane chemotherapy will remain on hold until the bilirubin further improved.  He will complete a course of antibiotics for the recent positive cultures on the urine and bile.  Mr. Kapaun will begin a trial of Marinol for anorexia.  He will complete a short course of Diflucan for the oral candidiasis.  Julieanne Manson, MD

## 2017-05-28 ENCOUNTER — Inpatient Hospital Stay: Payer: Medicare Other

## 2017-05-28 ENCOUNTER — Inpatient Hospital Stay: Payer: Medicare Other | Admitting: Nurse Practitioner

## 2017-05-29 ENCOUNTER — Inpatient Hospital Stay (HOSPITAL_BASED_OUTPATIENT_CLINIC_OR_DEPARTMENT_OTHER): Payer: Medicare Other | Admitting: Oncology

## 2017-05-29 ENCOUNTER — Inpatient Hospital Stay: Payer: Medicare Other

## 2017-05-29 VITALS — BP 85/63 | HR 106 | Temp 98.5°F | Resp 18 | Ht 75.0 in

## 2017-05-29 DIAGNOSIS — R630 Anorexia: Secondary | ICD-10-CM | POA: Diagnosis not present

## 2017-05-29 DIAGNOSIS — Z95828 Presence of other vascular implants and grafts: Secondary | ICD-10-CM

## 2017-05-29 DIAGNOSIS — C250 Malignant neoplasm of head of pancreas: Secondary | ICD-10-CM | POA: Diagnosis not present

## 2017-05-29 LAB — CMP (CANCER CENTER ONLY)
ALT: 82 U/L — ABNORMAL HIGH (ref 0–55)
AST: 97 U/L — AB (ref 5–34)
Albumin: 2.6 g/dL — ABNORMAL LOW (ref 3.5–5.0)
Alkaline Phosphatase: 146 U/L (ref 40–150)
Anion gap: 14 — ABNORMAL HIGH (ref 3–11)
BUN: 23 mg/dL (ref 7–26)
CHLORIDE: 102 mmol/L (ref 98–109)
CO2: 23 mmol/L (ref 22–29)
Calcium: 9.9 mg/dL (ref 8.4–10.4)
Creatinine: 0.97 mg/dL (ref 0.70–1.30)
GFR, Est AFR Am: >60 mL/min (ref >60)
Glucose, Bld: 97 mg/dL (ref 70–140)
Potassium: 4.1 mmol/L (ref 3.5–5.1)
SODIUM: 139 mmol/L (ref 136–145)
Total Bilirubin: 3.3 mg/dL — ABNORMAL HIGH (ref 0.2–1.2)
Total Protein: 8.4 g/dL — ABNORMAL HIGH (ref 6.4–8.3)

## 2017-05-29 LAB — CBC WITH DIFFERENTIAL (CANCER CENTER ONLY)
Basophils Absolute: 0.1 10*3/uL (ref 0.0–0.1)
Basophils Relative: 1 %
EOS PCT: 0 %
Eosinophils Absolute: 0 10*3/uL (ref 0.0–0.5)
HCT: 40.3 % (ref 38.4–49.9)
Hemoglobin: 13.5 g/dL (ref 13.0–17.1)
LYMPHS ABS: 2.6 10*3/uL (ref 0.9–3.3)
LYMPHS PCT: 37 %
MCH: 31.3 pg (ref 27.2–33.4)
MCHC: 33.5 g/dL (ref 32.0–36.0)
MCV: 93.3 fL (ref 79.3–98.0)
MONOS PCT: 8 %
Monocytes Absolute: 0.6 10*3/uL (ref 0.1–0.9)
Neutro Abs: 3.8 10*3/uL (ref 1.5–6.5)
Neutrophils Relative %: 54 %
Platelet Count: 378 10*3/uL (ref 140–400)
RBC: 4.32 MIL/uL (ref 4.20–5.82)
RDW: 15.4 % — ABNORMAL HIGH (ref 11.0–14.6)
WBC: 7.1 10*3/uL (ref 4.0–10.3)

## 2017-05-29 MED ORDER — PREDNISONE 20 MG PO TABS: 20.0000 mg | ORAL_TABLET | Freq: Every day | ORAL | 2 refills | Status: DC

## 2017-05-29 MED ORDER — SODIUM CHLORIDE 0.9% FLUSH
10.0000 mL | INTRAVENOUS | Status: DC | PRN
  Administered 2017-05-29: 10 mL via INTRAVENOUS
  Filled 2017-05-29: qty 10

## 2017-05-29 MED ORDER — SODIUM CHLORIDE 0.9 % IV SOLN
INTRAVENOUS | Status: DC
  Administered 2017-05-29: 16:00:00 via INTRAVENOUS

## 2017-05-29 NOTE — Patient Instructions (Signed)
Dehydration, Adult Dehydration is when there is not enough fluid or water in your body. This happens when you lose more fluids than you take in. Dehydration can range from mild to very bad. It should be treated right away to keep it from getting very bad. Symptoms of mild dehydration may include:  Thirst.  Dry lips.  Slightly dry mouth.  Dry, warm skin.  Dizziness. Symptoms of moderate dehydration may include:  Very dry mouth.  Muscle cramps.  Dark pee (urine). Pee may be the color of tea.  Your body making less pee.  Your eyes making fewer tears.  Heartbeat that is uneven or faster than normal (palpitations).  Headache.  Light-headedness, especially when you stand up from sitting.  Fainting (syncope). Symptoms of very bad dehydration may include:  Changes in skin, such as: ? Cold and clammy skin. ? Blotchy (mottled) or pale skin. ? Skin that does not quickly return to normal after being lightly pinched and let go (poor skin turgor).  Changes in body fluids, such as: ? Feeling very thirsty. ? Your eyes making fewer tears. ? Not sweating when body temperature is high, such as in hot weather. ? Your body making very little pee.  Changes in vital signs, such as: ? Weak pulse. ? Pulse that is more than 100 beats a minute when you are sitting still. ? Fast breathing. ? Low blood pressure.  Other changes, such as: ? Sunken eyes. ? Cold hands and feet. ? Confusion. ? Lack of energy (lethargy). ? Trouble waking up from sleep. ? Short-term weight loss. ? Unconsciousness. Follow these instructions at home:  If told by your doctor, drink an ORS: ? Make an ORS by using instructions on the package. ? Start by drinking small amounts, about  cup (120 mL) every 5-10 minutes. ? Slowly drink more until you have had the amount that your doctor said to have.  Drink enough clear fluid to keep your pee clear or pale yellow. If you were told to drink an ORS, finish the ORS  first, then start slowly drinking clear fluids. Drink fluids such as: ? Water. Do not drink only water by itself. Doing that can make the salt (sodium) level in your body get too low (hyponatremia). ? Ice chips. ? Fruit juice that you have added water to (diluted). ? Low-calorie sports drinks.  Avoid: ? Alcohol. ? Drinks that have a lot of sugar. These include high-calorie sports drinks, fruit juice that does not have water added, and soda. ? Caffeine. ? Foods that are greasy or have a lot of fat or sugar.  Take over-the-counter and prescription medicines only as told by your doctor.  Do not take salt tablets. Doing that can make the salt level in your body get too high (hypernatremia).  Eat foods that have minerals (electrolytes). Examples include bananas, oranges, potatoes, tomatoes, and spinach.  Keep all follow-up visits as told by your doctor. This is important. Contact a doctor if:  You have belly (abdominal) pain that: ? Gets worse. ? Stays in one area (localizes).  You have a rash.  You have a stiff neck.  You get angry or annoyed more easily than normal (irritability).  You are more sleepy than normal.  You have a harder time waking up than normal.  You feel: ? Weak. ? Dizzy. ? Very thirsty.  You have peed (urinated) only a small amount of very dark pee during 6-8 hours. Get help right away if:  You have symptoms of   very bad dehydration.  You cannot drink fluids without throwing up (vomiting).  Your symptoms get worse with treatment.  You have a fever.  You have a very bad headache.  You are throwing up or having watery poop (diarrhea) and it: ? Gets worse. ? Does not go away.  You have blood or something green (bile) in your throw-up.  You have blood in your poop (stool). This may cause poop to look black and tarry.  You have not peed in 6-8 hours.  You pass out (faint).  Your heart rate when you are sitting still is more than 100 beats a  minute.  You have trouble breathing. This information is not intended to replace advice given to you by your health care provider. Make sure you discuss any questions you have with your health care provider. Document Released: 10/14/2008 Document Revised: 07/08/2015 Document Reviewed: 02/11/2015 Elsevier Interactive Patient Education  2018 [employer]vier Inc.  

## 2017-05-29 NOTE — Addendum Note (Signed)
Addended by: Jethro Bolus A on: 05/29/2017 03:24 PM   Modules accepted: Orders

## 2017-05-29 NOTE — Progress Notes (Signed)
[facility] OFFICE PROGRESS NOTE   Diagnosis: Pancreas cancer  INTERVAL HISTORY:   Dustin Lucas returns as scheduled.  He is here with his wife.  He continues to feel "weak".  His appetite is poor despite Marinol.  He is taking in limited amounts of liquids.  He stays in the bed and wheelchair most of the day.  He denies pain.  No high fever.  Objective:  Vital signs in last 24 hours:  Blood pressure (!) 85/63, pulse (!) 106, temperature 98.5 F (36.9 C), temperature source Oral, resp. rate 18, height 6\' 3"  (1.905 m).    HEENT: Mild white coat over the tongue, no buccal thrush Resp: Lungs clear bilaterally Cardio: Regular rate and rhythm GI: Nontender, no hepatomegaly, gauze dressing at the right upper quadrant drain site Vascular: No leg edema    Portacath/PICC-without erythema  Lab Results:  Lab Results  Component Value Date   WBC 7.1 05/29/2017   HGB 13.5 05/29/2017   HCT 40.3 05/29/2017   MCV 93.3 05/29/2017   PLT 378 05/29/2017   NEUTROABS 3.8 05/29/2017    CMP  Lab Results  Component Value Date   NA 137 05/23/2017   K 3.8 05/23/2017   CL 102 05/23/2017   CO2 25 05/23/2017   GLUCOSE 101 05/23/2017   BUN 13 05/23/2017   CREATININE 0.74 05/23/2017   CALCIUM 9.2 05/23/2017   PROT 7.6 05/23/2017   ALBUMIN 2.1 (L) 05/23/2017   AST 74 (H) 05/23/2017   ALT 61 (H) 05/23/2017   ALKPHOS 141 05/23/2017   BILITOT 4.5 (HH) 05/23/2017   GFRNONAA >60 05/23/2017   GFRAA >60 05/23/2017    Medications: I have reviewed the patient's current medications.   Assessment/Plan: 1. Pancreas cancer-presenting with obstructive jaundice  CT 08/18/2015 with intra-and extrahepatic Dillard dilatation and dilatation of the pancreas duct, vague fullness at the head of the pancreas  EUS 10/05/2015 confirmed a pancreas head mass with no evidence of vascular invasion and no peripancreatic adenopathy (uT2,uN0)  EUS biopsy of the pancreas head mass on 10/05/2015  confirmed adenocarcinoma  CT chest/abdomen at [facility] 11/16/2015-hypoenhancing mass in the head and uncinate process of the pancreas with increased markedpancreatic ductal dilatation and greater than 180 encasement of the superior mesenteric artery and superior mesenteric vein above the level of the first tributary.  Cycle 1 FOLFOX 12/12/2015  Cycle 2 FOLFOX 12/27/2015  Cycle 3 FOLFOX 01/24/2016-Neulasta added  Cycle 4 FOLFOX 02/14/2016  Restaging CT scans 03/02/2016-decreasing size of pancreatic head/uncinate process mass with decreased encasement of the SMV (less than 180) and decreased pancreatic ductal dilatation. There remains greater than 180 of encasement of the SMA which is patent. Unchanged position of colon bile duct stent with decreasing right-sided pneumobilia. Stable 6 mm right upper lobe nodule.  Cycle 5 FOLFOX 03/13/2016 (oxaliplatin on hold due to poor tolerance)  Cycle 6 FOLFOX 03/27/2016 (oxaliplatin held)  Cycle 7 FOLFOX 04/10/2016 (oxaliplatin held)  Cycle 8 FOLFOX 04/24/2016 (oxaliplatin held)  MRI of the abdomen at [facility] on 05/23/2016 showed unchanged pancreatic head mass as compared to 05/02/2016 CT with similar encasement of the SMA and abutment/encasement of the SMV. No evidence of metastatic disease in the upper abdomen.   He completed a course of radiation delivered over 5 fractions on 06/18/2016 at [facility].  MRI abdomen at [facility] 10/30/2016-mildincrease in size of the pancreas head mass with encasement of the SMA and SMV, no evidence of metastatic disease  CT chest, abdomen, pelvis 04/15/2017- mass at the medial pancreas  head/uncinate 8 with encasement of the SMA, mild to moderate intrahepatic and extrahepatic ductal dilatation  CTs at [facility] 04/30/2017- slight increased size of the pancreas head mass, encasement of the SMA, increase compression of the portal confluence and narrowing/occlusion of the superior mesenteric vein, no new site of disease   2.  Obstructive jaundice secondary to #1-status postplacement of a bile duct stent 10/05/2015  Recurrent obstructive jaundice May 2019  Aborted attempted ERCP 05/15/2017 secondary to stenosis/ulceration at the pyloric channel  Internal/external biliary drain placed 05/17/2017   3. Hypertension  4. Anorexia/weight loss secondary to #1.  Trial of Marinol initiated 05/23/2017.  5. 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  8.Klebsiella urinary tract infection 05/17/2017   Disposition: Dustin Lucas appears unchanged.  He has a locally advanced pancreas cancer.  The cancer likely accounts for his lack of appetite and poor performance status.  I discussed the risk/benefit of chemotherapy with Dustin Lucas and his wife.  He understands the increased chance of toxicity from chemotherapy secondary to his poor performance status.  We discussed supportive care versus a trial of gemcitabine/Abraxane.  He would like to proceed with chemotherapy if the bilirubin returns lower today.  I will dose reduce the gemcitabine/Abraxane in anticipation of the increased risk of toxicity.  He will begin a trial of prednisone as an appetite stimulant, realizing he was recently diagnosed with a duodenal ulcer.  He continues antiacid therapy.  He will return for an office visit and a second treatment with gemcitabine/Abraxane on 06/12/2017.  He will contact us in the interim as needed.  25 minutes were spent with the patient today.  The majority of the time was used for counseling and coordination of care.  Betsy Coder, MD  05/29/2017  2:36 PM

## 2017-05-30 ENCOUNTER — Telehealth: Payer: Self-pay | Admitting: Oncology

## 2017-05-30 NOTE — Telephone Encounter (Signed)
Scheduled appt per 5/30 sch messages - pt wife is aware of appt date and time.

## 2017-06-03 ENCOUNTER — Inpatient Hospital Stay: Payer: Medicare Other

## 2017-06-03 ENCOUNTER — Inpatient Hospital Stay (HOSPITAL_BASED_OUTPATIENT_CLINIC_OR_DEPARTMENT_OTHER): Payer: Medicare Other | Admitting: Oncology

## 2017-06-03 ENCOUNTER — Other Ambulatory Visit (HOSPITAL_COMMUNITY): Payer: Self-pay | Admitting: Interventional Radiology

## 2017-06-03 ENCOUNTER — Inpatient Hospital Stay: Payer: Medicare Other | Attending: Oncology

## 2017-06-03 VITALS — BP 108/79 | HR 102 | Temp 97.0°F | Resp 18

## 2017-06-03 DIAGNOSIS — R627 Adult failure to thrive: Secondary | ICD-10-CM | POA: Insufficient documentation

## 2017-06-03 DIAGNOSIS — N390 Urinary tract infection, site not specified: Secondary | ICD-10-CM | POA: Insufficient documentation

## 2017-06-03 DIAGNOSIS — R630 Anorexia: Secondary | ICD-10-CM | POA: Diagnosis not present

## 2017-06-03 DIAGNOSIS — Z95828 Presence of other vascular implants and grafts: Secondary | ICD-10-CM

## 2017-06-03 DIAGNOSIS — B961 Klebsiella pneumoniae [K. pneumoniae] as the cause of diseases classified elsewhere: Secondary | ICD-10-CM | POA: Diagnosis not present

## 2017-06-03 DIAGNOSIS — C259 Malignant neoplasm of pancreas, unspecified: Secondary | ICD-10-CM

## 2017-06-03 DIAGNOSIS — Z452 Encounter for adjustment and management of vascular access device: Secondary | ICD-10-CM | POA: Insufficient documentation

## 2017-06-03 DIAGNOSIS — C250 Malignant neoplasm of head of pancreas: Secondary | ICD-10-CM

## 2017-06-03 LAB — CBC WITH DIFFERENTIAL (CANCER CENTER ONLY)
Basophils Absolute: 0 10*3/uL (ref 0.0–0.1)
Basophils Relative: 0 %
Eosinophils Absolute: 0 10*3/uL (ref 0.0–0.5)
Eosinophils Relative: 0 %
HEMATOCRIT: 47.4 % (ref 38.4–49.9)
HEMOGLOBIN: 15.6 g/dL (ref 13.0–17.1)
LYMPHS ABS: 0.9 10*3/uL (ref 0.9–3.3)
LYMPHS PCT: 5 %
MCH: 31 pg (ref 27.2–33.4)
MCHC: 32.9 g/dL (ref 32.0–36.0)
MCV: 94.3 fL (ref 79.3–98.0)
MONO ABS: 0.7 10*3/uL (ref 0.1–0.9)
Monocytes Relative: 4 %
NEUTROS ABS: 17.2 10*3/uL — AB (ref 1.5–6.5)
NEUTROS PCT: 91 %
Platelet Count: 325 10*3/uL (ref 140–400)
RBC: 5.03 MIL/uL (ref 4.20–5.82)
RDW: 15.6 % — AB (ref 11.0–14.6)
WBC Count: 18.8 10*3/uL — ABNORMAL HIGH (ref 4.0–10.3)

## 2017-06-03 LAB — CMP (CANCER CENTER ONLY)
ALBUMIN: 3.2 g/dL — AB (ref 3.5–5.0)
ALK PHOS: 167 U/L — AB (ref 40–150)
ALT: 127 U/L — ABNORMAL HIGH (ref 0–55)
ANION GAP: 14 — AB (ref 3–11)
AST: 115 U/L — ABNORMAL HIGH (ref 5–34)
BUN: 34 mg/dL — ABNORMAL HIGH (ref 7–26)
CALCIUM: 10.6 mg/dL — AB (ref 8.4–10.4)
CO2: 23 mmol/L (ref 22–29)
Chloride: 102 mmol/L (ref 98–109)
Creatinine: 1.13 mg/dL (ref 0.70–1.30)
GFR, Est AFR Am: >60 mL/min (ref >60)
GFR, Estimated: >60 mL/min (ref >60)
GLUCOSE: 122 mg/dL (ref 70–140)
POTASSIUM: 4.5 mmol/L (ref 3.5–5.1)
SODIUM: 139 mmol/L (ref 136–145)
Total Bilirubin: 4.3 mg/dL (ref 0.2–1.2)
Total Protein: 9.1 g/dL — ABNORMAL HIGH (ref 6.4–8.3)

## 2017-06-03 MED ORDER — SODIUM CHLORIDE 0.9% FLUSH
10.0000 mL | INTRAVENOUS | Status: DC | PRN
  Administered 2017-06-03: 10 mL via INTRAVENOUS
  Filled 2017-06-03: qty 10

## 2017-06-03 MED ORDER — SODIUM CHLORIDE 0.9 % IV SOLN
INTRAVENOUS | Status: DC
Start: 2017-06-03 — End: 2017-06-03
  Administered 2017-06-03: 14:00:00 via INTRAVENOUS

## 2017-06-03 MED ORDER — ALTEPLASE 2 MG IJ SOLR
2.0000 mg | Freq: Once | INTRAMUSCULAR | Status: DC | PRN
  Filled 2017-06-03: qty 2

## 2017-06-03 MED ORDER — HEPARIN SOD (PORK) LOCK FLUSH 100 UNIT/ML IV SOLN
500.0000 [IU] | Freq: Once | INTRAVENOUS | Status: AC | PRN
  Administered 2017-06-03: 500 [IU] via INTRAVENOUS
  Filled 2017-06-03: qty 5

## 2017-06-03 NOTE — Patient Instructions (Signed)
Dehydration, Adult Dehydration is when there is not enough fluid or water in your body. This happens when you lose more fluids than you take in. Dehydration can range from mild to very bad. It should be treated right away to keep it from getting very bad. Symptoms of mild dehydration may include:  Thirst.  Dry lips.  Slightly dry mouth.  Dry, warm skin.  Dizziness. Symptoms of moderate dehydration may include:  Very dry mouth.  Muscle cramps.  Dark pee (urine). Pee may be the color of tea.  Your body making less pee.  Your eyes making fewer tears.  Heartbeat that is uneven or faster than normal (palpitations).  Headache.  Light-headedness, especially when you stand up from sitting.  Fainting (syncope). Symptoms of very bad dehydration may include:  Changes in skin, such as: ? Cold and clammy skin. ? Blotchy (mottled) or pale skin. ? Skin that does not quickly return to normal after being lightly pinched and let go (poor skin turgor).  Changes in body fluids, such as: ? Feeling very thirsty. ? Your eyes making fewer tears. ? Not sweating when body temperature is high, such as in hot weather. ? Your body making very little pee.  Changes in vital signs, such as: ? Weak pulse. ? Pulse that is more than 100 beats a minute when you are sitting still. ? Fast breathing. ? Low blood pressure.  Other changes, such as: ? Sunken eyes. ? Cold hands and feet. ? Confusion. ? Lack of energy (lethargy). ? Trouble waking up from sleep. ? Short-term weight loss. ? Unconsciousness. Follow these instructions at home:  If told by your doctor, drink an ORS: ? Make an ORS by using instructions on the package. ? Start by drinking small amounts, about  cup (120 mL) every 5-10 minutes. ? Slowly drink more until you have had the amount that your doctor said to have.  Drink enough clear fluid to keep your pee clear or pale yellow. If you were told to drink an ORS, finish the ORS  first, then start slowly drinking clear fluids. Drink fluids such as: ? Water. Do not drink only water by itself. Doing that can make the salt (sodium) level in your body get too low (hyponatremia). ? Ice chips. ? Fruit juice that you have added water to (diluted). ? Low-calorie sports drinks.  Avoid: ? Alcohol. ? Drinks that have a lot of sugar. These include high-calorie sports drinks, fruit juice that does not have water added, and soda. ? Caffeine. ? Foods that are greasy or have a lot of fat or sugar.  Take over-the-counter and prescription medicines only as told by your doctor.  Do not take salt tablets. Doing that can make the salt level in your body get too high (hypernatremia).  Eat foods that have minerals (electrolytes). Examples include bananas, oranges, potatoes, tomatoes, and spinach.  Keep all follow-up visits as told by your doctor. This is important. Contact a doctor if:  You have belly (abdominal) pain that: ? Gets worse. ? Stays in one area (localizes).  You have a rash.  You have a stiff neck.  You get angry or annoyed more easily than normal (irritability).  You are more sleepy than normal.  You have a harder time waking up than normal.  You feel: ? Weak. ? Dizzy. ? Very thirsty.  You have peed (urinated) only a small amount of very dark pee during 6-8 hours. Get help right away if:  You have symptoms of   very bad dehydration.  You cannot drink fluids without throwing up (vomiting).  Your symptoms get worse with treatment.  You have a fever.  You have a very bad headache.  You are throwing up or having watery poop (diarrhea) and it: ? Gets worse. ? Does not go away.  You have blood or something green (bile) in your throw-up.  You have blood in your poop (stool). This may cause poop to look black and tarry.  You have not peed in 6-8 hours.  You pass out (faint).  Your heart rate when you are sitting still is more than 100 beats a  minute.  You have trouble breathing. This information is not intended to replace advice given to you by your health care provider. Make sure you discuss any questions you have with your health care provider. Document Released: 10/14/2008 Document Revised: 07/08/2015 Document Reviewed: 02/11/2015 Elsevier Interactive Patient Education  2018 [employer]vier Inc.  

## 2017-06-03 NOTE — Progress Notes (Signed)
[facility] OFFICE PROGRESS NOTE   Diagnosis: Pancreas cancer  INTERVAL HISTORY:   Dustin Lucas returns for a scheduled first cycle of gemcitabine/Abraxane.  Chemotherapy was held last week secondary to hyperbilirubinemia.  He started prednisone as an appetite stimulant.  This has helped partially.  He remains weak.  The biliary drain is functioning.  Objective:  Vital signs in last 24 hours:  There were no vitals taken for this visit.    HEENT: No thrush Resp: Lungs clear anteriorly, no respiratory distress Cardio: Regular rate and rhythm GI: No hepatomegaly, nontender, right upper quadrant biliary drain site with a gauze dressing, bilious drainage in the tubing and reservoir Vascular: No leg edema   Portacath/PICC-without erythema  Lab Results:  Lab Results  Component Value Date   WBC 18.8 (H) 06/03/2017   HGB 15.6 06/03/2017   HCT 47.4 06/03/2017   MCV 94.3 06/03/2017   PLT 325 06/03/2017   NEUTROABS 17.2 (H) 06/03/2017    CMP  Lab Results  Component Value Date   NA 139 06/03/2017   K 4.5 06/03/2017   CL 102 06/03/2017   CO2 23 06/03/2017   GLUCOSE 122 06/03/2017   BUN 34 (H) 06/03/2017   CREATININE 1.13 06/03/2017   CALCIUM 10.6 (H) 06/03/2017   PROT 9.1 (H) 06/03/2017   ALBUMIN 3.2 (L) 06/03/2017   AST 115 (H) 06/03/2017   ALT 127 (H) 06/03/2017   ALKPHOS 167 (H) 06/03/2017   BILITOT 4.3 (HH) 06/03/2017   GFRNONAA >60 06/03/2017   GFRAA >60 06/03/2017     Medications: I have reviewed the patient's current medications.   Assessment/Plan: 1. Pancreas cancer-presenting with obstructive jaundice  CT 08/18/2015 with intra-and extrahepatic Dillard dilatation and dilatation of the pancreas duct, vague fullness at the head of the pancreas  EUS 10/05/2015 confirmed a pancreas head mass with no evidence of vascular invasion and no peripancreatic adenopathy (uT2,uN0)  EUS biopsy of the pancreas head mass on 10/05/2015 confirmed  adenocarcinoma  CT chest/abdomen at [facility] 11/16/2015-hypoenhancing mass in the head and uncinate process of the pancreas with increased markedpancreatic ductal dilatation and greater than 180 encasement of the superior mesenteric artery and superior mesenteric vein above the level of the first tributary.  Cycle 1 FOLFOX 12/12/2015  Cycle 2 FOLFOX 12/27/2015  Cycle 3 FOLFOX 01/24/2016-Neulasta added  Cycle 4 FOLFOX 02/14/2016  Restaging CT scans 03/02/2016-decreasing size of pancreatic head/uncinate process mass with decreased encasement of the SMV (less than 180) and decreased pancreatic ductal dilatation. There remains greater than 180 of encasement of the SMA which is patent. Unchanged position of colon bile duct stent with decreasing right-sided pneumobilia. Stable 6 mm right upper lobe nodule.  Cycle 5 FOLFOX 03/13/2016 (oxaliplatin on hold due to poor tolerance)  Cycle 6 FOLFOX 03/27/2016 (oxaliplatin held)  Cycle 7 FOLFOX 04/10/2016 (oxaliplatin held)  Cycle 8 FOLFOX 04/24/2016 (oxaliplatin held)  MRI of the abdomen at [facility] on 05/23/2016 showed unchanged pancreatic head mass as compared to 05/02/2016 CT with similar encasement of the SMA and abutment/encasement of the SMV. No evidence of metastatic disease in the upper abdomen.   He completed a course of radiation delivered over 5 fractions on 06/18/2016 at [facility].  MRI abdomen at [facility] 10/30/2016-mildincrease in size of the pancreas head mass with encasement of the SMA and SMV, no evidence of metastatic disease  CT chest, abdomen, pelvis 04/15/2017-mass at the medial pancreas head/uncinate 8 with encasement of the SMA, mild to moderate intrahepatic and extrahepatic ductal dilatation  CTs at  [facility] 04/30/2017-slight increased size of the pancreas head mass, encasement of the SMA, increase compression of the portal confluence and narrowing/occlusion of the superior mesenteric vein, no new site of disease   2. Obstructive  jaundice secondary to #1-status postplacement of a bile duct stent 10/05/2015  Recurrent obstructive jaundice May 2019  Aborted attempted ERCP 05/15/2017 secondary to stenosis/ulceration at the pyloric channel  Internal/external biliary drain placed 05/17/2017   3. Hypertension  4. Anorexia/weight loss secondary to #1.Trial of Marinol initiated 05/23/2017.  5. 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  8.Klebsiella urinary tract infection 05/17/2017    Disposition: Mr. Debruhl appears unchanged.  He continues to have failure to thrive and anorexia related to pancreas cancer.  The hyperbilirubinemia persists.  The bilirubin and liver enzymes are higher today.  I contacted interventional radiology.  They will schedule him for a procedure to study the biliary drain in place and internal stent this week.  Gemcitabine/Abraxane will be held until the bilirubin is lower.  I discussed options of supportive care/hospice versus chemotherapy again today with Dustin Lucas and Dustin Lucas.  He would like to proceed with chemotherapy if the bilirubin can be lowered.  We will check a chemistry panel later this week and arrange for gemcitabine/Abraxane during the week of 06/10/2017 if lower.  He requested home IV blood draws and fluids.  We will arrange for this via [facility].  25 minutes were spent with the patient today.  The majority of the time was used for counseling and coordination of care.  Betsy Coder, MD  06/03/2017  4:45 PM

## 2017-06-03 NOTE — Progress Notes (Signed)
Per Dr. Benay Spice, holding treatment today due to bili of 4.3. MD wants to admin IV fluids. Infusion RN made aware.

## 2017-06-04 ENCOUNTER — Other Ambulatory Visit: Payer: Self-pay | Admitting: Radiology

## 2017-06-05 ENCOUNTER — Ambulatory Visit (HOSPITAL_COMMUNITY)
Admission: RE | Admit: 2017-06-05 | Discharge: 2017-06-05 | Disposition: A | Discharge status: Home / Self Care | Payer: Medicare Other | Source: Ambulatory Visit | Attending: Interventional Radiology | Admitting: Interventional Radiology

## 2017-06-05 ENCOUNTER — Encounter (HOSPITAL_COMMUNITY): Payer: Self-pay

## 2017-06-05 ENCOUNTER — Ambulatory Visit (HOSPITAL_COMMUNITY)
Admission: RE | Admit: 2017-06-05 | Discharge: 2017-06-05 | Disposition: A | Discharge status: Home / Self Care | Payer: Medicare Other | Source: Ambulatory Visit | Attending: Oncology | Admitting: Oncology

## 2017-06-05 DIAGNOSIS — Z7952 Long term (current) use of systemic steroids: Secondary | ICD-10-CM | POA: Insufficient documentation

## 2017-06-05 DIAGNOSIS — K831 Obstruction of bile duct: Secondary | ICD-10-CM | POA: Insufficient documentation

## 2017-06-05 DIAGNOSIS — I10 Essential (primary) hypertension: Secondary | ICD-10-CM | POA: Diagnosis not present

## 2017-06-05 DIAGNOSIS — M1990 Unspecified osteoarthritis, unspecified site: Secondary | ICD-10-CM | POA: Insufficient documentation

## 2017-06-05 DIAGNOSIS — C259 Malignant neoplasm of pancreas, unspecified: Secondary | ICD-10-CM | POA: Insufficient documentation

## 2017-06-05 DIAGNOSIS — C61 Malignant neoplasm of prostate: Secondary | ICD-10-CM | POA: Diagnosis not present

## 2017-06-05 HISTORY — PX: IR BILIARY STENT(S) EXISTING ACCESS INC DILATION CATH EXCHANGE: IMG6048

## 2017-06-05 LAB — CBC WITH DIFFERENTIAL/PLATELET
Basophils Absolute: 0 10*3/uL (ref 0.0–0.1)
Basophils Relative: 0 %
EOS ABS: 0 10*3/uL (ref 0.0–0.7)
Eosinophils Relative: 0 %
HCT: 45.2 % (ref 39.0–52.0)
HEMOGLOBIN: 15.1 g/dL (ref 13.0–17.0)
LYMPHS ABS: 1.2 10*3/uL (ref 0.7–4.0)
LYMPHS PCT: 13 %
MCH: 31 pg (ref 26.0–34.0)
MCHC: 33.4 g/dL (ref 30.0–36.0)
MCV: 92.8 fL (ref 78.0–100.0)
Monocytes Absolute: 0.7 10*3/uL (ref 0.1–1.0)
Monocytes Relative: 8 %
NEUTROS PCT: 79 %
Neutro Abs: 7.7 10*3/uL (ref 1.7–7.7)
Platelets: 258 10*3/uL (ref 150–400)
RBC: 4.87 MIL/uL (ref 4.22–5.81)
RDW: 15.7 % — ABNORMAL HIGH (ref 11.5–15.5)
WBC: 9.7 10*3/uL (ref 4.0–10.5)

## 2017-06-05 LAB — COMPREHENSIVE METABOLIC PANEL
ALK PHOS: 124 U/L (ref 38–126)
ALT: 94 U/L — AB (ref 17–63)
ANION GAP: 10 (ref 5–15)
AST: 68 U/L — ABNORMAL HIGH (ref 15–41)
Albumin: 3.1 g/dL — ABNORMAL LOW (ref 3.5–5.0)
BILIRUBIN TOTAL: 3.8 mg/dL — AB (ref 0.3–1.2)
BUN: 35 mg/dL — ABNORMAL HIGH (ref 6–20)
CALCIUM: 9.9 mg/dL (ref 8.9–10.3)
CO2: 25 mmol/L (ref 22–32)
CREATININE: 0.94 mg/dL (ref 0.61–1.24)
Chloride: 107 mmol/L (ref 101–111)
Glucose, Bld: 129 mg/dL — ABNORMAL HIGH (ref 65–99)
Potassium: 4.1 mmol/L (ref 3.5–5.1)
SODIUM: 142 mmol/L (ref 135–145)
TOTAL PROTEIN: 8.1 g/dL (ref 6.5–8.1)

## 2017-06-05 LAB — PROTIME-INR
INR: 1.18
PROTHROMBIN TIME: 14.9 s (ref 11.4–15.2)

## 2017-06-05 MED ORDER — FENTANYL CITRATE (PF) 100 MCG/2ML IJ SOLN
INTRAMUSCULAR | Status: AC | PRN
  Administered 2017-06-05: 50 ug via INTRAVENOUS

## 2017-06-05 MED ORDER — MIDAZOLAM HCL 2 MG/2ML IJ SOLN
INTRAMUSCULAR | Status: AC
  Filled 2017-06-05: qty 4

## 2017-06-05 MED ORDER — IOPAMIDOL (ISOVUE-300) INJECTION 61%
INTRAVENOUS | Status: AC
  Administered 2017-06-05: 20 mL
  Filled 2017-06-05: qty 50

## 2017-06-05 MED ORDER — MIDAZOLAM HCL 2 MG/2ML IJ SOLN
INTRAMUSCULAR | Status: AC | PRN
  Administered 2017-06-05: 1 mg via INTRAVENOUS

## 2017-06-05 MED ORDER — HEPARIN SOD (PORK) LOCK FLUSH 100 UNIT/ML IV SOLN
500.0000 [IU] | INTRAVENOUS | Status: AC | PRN
  Administered 2017-06-05: 500 [IU]

## 2017-06-05 MED ORDER — NALOXONE HCL 0.4 MG/ML IJ SOLN
INTRAMUSCULAR | Status: AC
  Filled 2017-06-05: qty 1

## 2017-06-05 MED ORDER — PIPERACILLIN-TAZOBACTAM 3.375 G IVPB
3.3750 g | Freq: Once | INTRAVENOUS | Status: AC
  Administered 2017-06-05: 3.375 g via INTRAVENOUS

## 2017-06-05 MED ORDER — PIPERACILLIN-TAZOBACTAM 3.375 G IVPB
INTRAVENOUS | Status: AC
  Filled 2017-06-05: qty 50

## 2017-06-05 MED ORDER — SODIUM CHLORIDE 0.9 % IV SOLN
INTRAVENOUS | Status: DC
  Administered 2017-06-05: 12:00:00 via INTRAVENOUS

## 2017-06-05 MED ORDER — FLUMAZENIL 0.5 MG/5ML IV SOLN
INTRAVENOUS | Status: AC
  Filled 2017-06-05: qty 5

## 2017-06-05 MED ORDER — LIDOCAINE HCL 1 % IJ SOLN
INTRAMUSCULAR | Status: AC
  Filled 2017-06-05: qty 20

## 2017-06-05 MED ORDER — FENTANYL CITRATE (PF) 100 MCG/2ML IJ SOLN
INTRAMUSCULAR | Status: AC
  Filled 2017-06-05: qty 4

## 2017-06-05 MED ORDER — LIDOCAINE HCL (PF) 1 % IJ SOLN
INTRAMUSCULAR | Status: AC | PRN
  Administered 2017-06-05: 10 mL

## 2017-06-05 MED ORDER — IOPAMIDOL (ISOVUE-300) INJECTION 61%
20.0000 mL | Freq: Once | INTRAVENOUS | Status: AC | PRN
  Administered 2017-06-05: 20 mL

## 2017-06-05 NOTE — Procedures (Signed)
Biliary obstruction  S/p 10 x 40 wall flex CBD stent  No comp Stable EBL 0 External drain removed Full report in pacs

## 2017-06-05 NOTE — H&P (Signed)
Referring Physician(s): Sherrill,B  Supervising Physician: Daryll Brod  Patient Status: [facility] OP  Chief Complaint:  Pancreatic cancer, biliary obstruction  Subjective: Patient familiar to IR service from prior Port-A-Cath placement on 12/08/2015.  He has a known history of pancreatic cancer since 2017, status post chemoradiation.  He had a distal common bile duct stent placed on 10/05/2015 which was not visualized on recent imaging.  ERCP with metal stent placement was attempted 5/16 , however unsuccessful secondary to severe stenosis of the pyloric channel with underlying inflammation, circumferential ulceration and edema.  The duodenoscope could not be advanced into the duodenal bulb.  He is s/p  I/E biliary drain placement 5/17.  He presents again today for follow-up cholangiogram with possible biliary stent placement.  He continues to remain weak and cachectic appearing.  He denies fever, headache, worsening dyspnea, cough, back pain, nausea, vomiting or bleeding.  He does have some left lower chest/upper abdominal discomfort. Past Medical History:  Diagnosis Date  . Arthritis   . Asthma    as a child   . Cancer ([place]) 7-8 yrs ago   prostate cancer, monitoring psa levels q 6 months; pancreatic cancer   . Hx of radiation therapy   . Hypertension   . PONV (postoperative nausea and vomiting)    in past , none recent  . Urinary frequency    Past Surgical History:  Procedure Laterality Date  . ENDOSCOPIC RETROGRADE CHOLANGIOPANCREATOGRAPHY (ERCP) WITH PROPOFOL N/A 05/15/2017   Procedure: ABORTED ENDOSCOPIC RETROGRADE CHOLANGIOPANCREATOGRAPHY (ERCP) WITH PROPOFOL;  Surgeon: Ronnette Juniper, MD;  Location: [facility];  Service: Gastroenterology;  Laterality: N/A;  . ERCP N/A 08/21/2015   Procedure: ENDOSCOPIC RETROGRADE CHOLANGIOPANCREATOGRAPHY (ERCP);  Surgeon: Clarene Essex, MD;  Location: [facility];  Service: Endoscopy;  Laterality: N/A;  . ERCP N/A 08/20/2015   Procedure: ENDOSCOPIC  RETROGRADE CHOLANGIOPANCREATOGRAPHY (ERCP);  Surgeon: Carol Ada, MD;  Location: [facility];  Service: Endoscopy;  Laterality: N/A;  . ERCP N/A 10/05/2015   Procedure: ENDOSCOPIC RETROGRADE CHOLANGIOPANCREATOGRAPHY (ERCP);  Surgeon: Arta Silence, MD;  Location: [facility];  Service: Endoscopy;  Laterality: N/A;  Dr. Watt Climes to do ERCP  . ESOPHAGOGASTRODUODENOSCOPY N/A 05/15/2017   Procedure: ESOPHAGOGASTRODUODENOSCOPY (EGD);  Surgeon: Ronnette Juniper, MD;  Location: [facility];  Service: Gastroenterology;  Laterality: N/A;  . ESOPHAGOGASTRODUODENOSCOPY (EGD) WITH PROPOFOL N/A 08/29/2015   Procedure: ESOPHAGOGASTRODUODENOSCOPY (EGD) WITH PROPOFOL;  Surgeon: Arta Silence, MD;  Location: [facility];  Service: Endoscopy;  Laterality: N/A;  . EUS N/A 10/05/2015   Procedure: UPPER ENDOSCOPIC ULTRASOUND (EUS) RADIAL;  Surgeon: Arta Silence, MD;  Location: [facility];  Service: Endoscopy;  Laterality: N/A;  . FRACTURE SURGERY Left    plate in ankle  . IR GENERIC HISTORICAL  12/08/2015   IR US GUIDE VASC ACCESS RIGHT 12/08/2015 Corrie Mckusick, DO [facility]-INTERV RAD  . IR GENERIC HISTORICAL  12/08/2015   IR FLUORO GUIDE PORT INSERTION RIGHT 12/08/2015 Corrie Mckusick, DO [facility]  . IR INT EXT BILIARY DRAIN WITH CHOLANGIOGRAM  05/17/2017  . JOINT REPLACEMENT     hip replacement  right  . TONSILLECTOMY    . TOTAL HIP REVISION Right 08/09/2014   Procedure: TOTAL HIP REVISION;  Surgeon: Frederik Pear, MD;  Location: [facility];  Service: Orthopedics;  Laterality: Right;  REVISION (ASR) RIGHT TOTAL HIP ARHROPLASTY**INNOMET OSTEOTONES  . TRANSURETHRAL RESECTION OF PROSTATE N/A 12/18/2016   Procedure: TRANSURETHRAL RESECTION OF THE PROSTATE (TURP);  Surgeon: Irine Seal, MD;  Location: [facility];  Service: Urology;  Laterality: N/A;  .  UPPER ESOPHAGEAL ENDOSCOPIC ULTRASOUND (EUS) N/A 08/29/2015   Procedure: UPPER ESOPHAGEAL ENDOSCOPIC ULTRASOUND (EUS);  Surgeon: Arta Silence, MD;  Location: [facility];  Service: Endoscopy;  Laterality: N/A;       Allergies: Shellfish allergy  Medications: Prior to Admission medications   Medication Sig Start Date End Date Taking? Authorizing Provider  cholecalciferol (VITAMIN D) 1000 units tablet Take 2,000 Units by mouth daily.   Yes [provider]  dronabinol (MARINOL) 2.5 MG capsule Take 1 capsule (2.5 mg total) by mouth 2 (two) times daily before a meal. 05/23/17  Yes Owens Shark, NP  lactose free nutrition (BOOST) LIQD Take 237 mLs by mouth 2 (two) times daily between meals.   Yes [provider]  loperamide (IMODIUM A-D) 2 MG tablet Take 2 mg by mouth 4 (four) times daily as needed for diarrhea or loose stools.   Yes [provider]  oxyCODONE-acetaminophen (PERCOCET/ROXICET) 5-325 MG tablet Take 0.5-1 tablets by mouth every 4 (four) hours as needed for severe pain. Patient taking differently: Take 1 tablet by mouth every 4 (four) hours as needed for severe pain.  05/13/17  Yes Owens Shark, NP  pantoprazole (PROTONIX) 40 MG tablet Take 40 mg by mouth 2 (two) times daily.  05/15/17  Yes [provider]  predniSONE (DELTASONE) 20 MG tablet Take 1 tablet (20 mg total) by mouth daily with breakfast. 05/29/17  Yes Ladell Pier, MD  famotidine (PEPCID) 20 MG tablet Take 1 tablet (20 mg total) by mouth 2 (two) times daily for 14 days. 05/21/17 06/04/17  Kayleen Memos, DO     Vital Signs: BP (!) 144/95 (BP Location: Right Arm)   Pulse (!) 110   Temp (!) 97.5 F (36.4 C) (Oral)   Resp 18   Ht 6\' 3"  (1.905 m)   Wt 137 lb (62.1 kg)   BMI 17.12 kg/m   Physical Exam awake, alert.  Cachectic appearing;  chest clear to auscultation bilaterally anteriorly.  Heart with tachycardic but regular rhythm.  Abdomen soft, positive bowel sounds, intact bili drain with small amount of old bile on gauze pad; no LE edema  Imaging: No results found.  Labs:  CBC: Recent Labs    05/20/17 0500 05/23/17 1030  05/29/17 1335 06/03/17 1217  WBC 6.8 6.8 7.1 18.8*  HGB 10.1* 11.9* 13.5 15.6  HCT 28.7* 34.8* 40.3 47.4  PLT 246 307 378 325    COAGS: Recent Labs    05/17/17 0412 06/05/17 1204  INR 1.33 1.18    BMP: Recent Labs    05/23/17 1030 05/29/17 1335 06/03/17 1217 06/05/17 1204  NA 137 139 139 142  K 3.8 4.1 4.5 4.1  CL 102 102 102 107  CO2 25 23 23 25   GLUCOSE 101 97 122 129*  BUN 13 23 34* 35*  CALCIUM 9.2 9.9 10.6* 9.9  CREATININE 0.74 0.97 1.13 0.94  GFRNONAA >60 >60 >60 >60  GFRAA >60 >60 >60 >60    LIVER FUNCTION TESTS: Recent Labs    05/23/17 1030 05/29/17 1335 06/03/17 1217 06/05/17 1204  BILITOT 4.5* 3.3* 4.3* 3.8*  AST 74* 97* 115* 68*  ALT 61* 82* 127* 94*  ALKPHOS 141 146 167* 124  PROT 7.6 8.4* 9.1* 8.1  ALBUMIN 2.1* 2.6* 3.2* 3.1*    Assessment and Plan: Pt with history of pancreatic cancer since 2017, status post chemoradiation.  He had a distal common bile duct stent placed on 10/05/2015 which was not visualized on recent  imaging.  ERCP with metal stent placement was attempted 5/16 , however unsuccessful secondary to severe stenosis of the pyloric channel with underlying inflammation, circumferential ulceration and edema.  The duodenoscope could not be advanced into the duodenal bulb.  He is s/p  I/E biliary drain placement 5/17.  He presents again today for follow-up cholangiogram with possible biliary stent placement.  Details/risks of procedure, including but not limited to, internal bleeding, infection, inability to place stent and prolonged need for external drain discussed with patient and wife with their understanding and consent.   Electronically Signed: D. Rowe Robert, PA-C 06/05/2017, 1:23 PM   I spent a total of 25 minutes at the the patient's bedside AND on the patient's hospital floor or unit, greater than 50% of which was counseling/coordinating care for cholangiogram with possible biliary stent placement

## 2017-06-05 NOTE — Discharge Instructions (Signed)
Keep dressing on for 24 hours. When you bathe, please only use the shower. Do not fully soak your would in the tub.   Biliary Drainage Catheter Home Guide A biliary drainage catheter is a thin, flexible tube that is inserted through your skin into the bile ducts in your liver. Bile is a thick yellow or green fluid that helps digest fat in foods. The purpose of a biliary drainage catheter is to keep bile from backing up into your liver. Backup of bile can occur when there is a blockage that prevents bile from moving from the bile ducts into the small intestine as it should. The blockage can be caused by gallstones, a tumor, or scar tissue. There are three types of biliary drainage:  External biliary drainage. With this type, bile is only drained into a collection bag outside your body (external collection bag).  Internal-external biliary drainage. With this type, bile is drained to an external collection bag as well as into your small intestine.  Internal biliary drainage. With this type, bile is only drained into your small intestine.  General home care includes these daily actions:  Inspection of your drainage catheter.  Flushing your drainage catheter with saline.  Emptying drainage from the collection bag (if present).  Recording the amount of drainage.  Checking the catheter insertion site for signs of infection. Check for: ? Redness, swelling, or pain. ? Fluid or blood. ? Warmth. ? Pus or a bad smell. How do I inspect my drainage catheter?  Check the dressing to make sure that it is dry and clean.  Look at the skin around the drainage catheter when changing the dressing for any problems such as redness, rash, or skin breakdown.  Check the drainage bag to make sure that drainage fluid is flowing into the bag well. Note the color and amount compared to other days.  Check the drainage catheter and bag for any cracks or kinks in the tubing. How do I change my dressing? The  dressing over the drainage catheter should be changed every other day, or more often if needed to keep the dressing dry. Your health care provider will instruct you about how often to change your dressing. Supplies needed:  Mild soap and warm water.  Split gauze pads, 4 x 4 inches (10 x 10 cm) to use as a dressing sponge.  Gauze pads, 4 x 4 inches (10 x 10 cm) or adhesive dressing cover.  Paper tape. How to change the dressing: 1. Wash your hands with soap and water. 2. Gently remove the old dressing. Avoid using scissors to remove the dressing because they may damage the drainage catheter. 3. Wash the skin around the insertion site with mild soap and warm water, rinse well, then pat the area dry with a clean cloth. 4. Check the skin around the drainage catheter for redness or swelling, or for yellow or green discharge that has a bad smell. 5. If the drainage catheter was stitched (sutured) to the skin, inspect the suture to make sure it is still anchored in the skin. 6. Do not apply creams, ointments, or alcohol to the site. Allow the skin to air-dry completely before you apply a new dressing. 7. Place the drainage catheter through the slit in a dressing sponge. The dressing sponge should slide under the disk that holds the drainage catheter in place. 8. Cover the drainage catheter and the dressing sponge with a 4 x 4 inch (10 x 10 cm) gauze. The drainage catheter  should rest on the gauze and not on the skin. 9. Tape the dressing to the skin. 10. You may be instructed to use an adhesive dressing covering over the top of this in place of the gauze and tape. 11. Wash your hands with soap and water. How do I flush my drainage catheter? Biliary drainagecatheters should be flushed daily, or as often as told by your health care provider. The end of the drainage catheter is closed using an IV cap. A syringe can be directly connected to the IV cap. Supplies needed:  Alcohol swab.  10 mL  prefilled normal saline syringe. How to flush the drainage catheter: 1. Wash your hands with soap and water. 2. If your drainage catheter has a stopcock attached to it, turn the stopcock toward the drainage bag. This will allow the saline to flow in the direction of your body. 3. Clean the IV cap with an alcohol swab. 4. Screw the tip of a 10 mL normal saline syringe onto the IV cap. 5. Inject the saline over 5-10 seconds. If you feel resistance while injecting, stop immediately. Avoid  pulling back on the plunger. Doing that could increase your risk of infection. 6. Remove the syringe from the cap. Turn the stopcock so that fluid flows from your body into the drainage bag. You may notice more fluid flowing into the bag after you have completed the flush. How do I attach a bag to my drainage catheter? If you are having trouble with your internal biliary drain, you may be directed by your health care provider to use bag drainage until you can be seen to fix the problem. For this reason, you should always have a collection bag and connecting tubing at home. If you do not have these supplies, remember to ask for them at your next appointment. 1. Remove the bag and the connecting tubing from their packaging. 2. Connect the funnel end of the tubing to the bag's cone-shaped stem. 3. Remove the IV cap from the biliary drain. To do this, unscrew it and replace it with the screw-on end of the tubing. 4. Save the IV cap in a plastic storage bag that can be sealed.  How do I empty my collection bag? Empty the collection bag whenever it becomes 2/3 full. Also empty it before you go to sleep. Most collection bags have a drainage valve at the bottom so the bag can be that allows them to be emptied easily. 1. Wash your hands with soap and water. 2. Hold the collection bag over the toilet, basin, or collection container. Use a measuring container if your health care provider told you to measure the  drainage. 3. Unscrew the valve to open it, and allow the bag to drain. 4. Close the valve securely to avoid leakage. 5. Use a tissue or disposable napkin to wipe the valve clean. 6. Wash the measuring container with soap and water. 7. Record the amount of drainage as told by your health care provider.  Contact a health care provider if:  Your pain gets worse after it had improved, and it is not relieved with pain medicines.  You have any questions about caring for your drainage catheter or collection bag.  You have any of these around your catheter insertion site or coming from it: ? Skin breakdown. ? Redness, swelling, or pain. ? Fluid or blood. ? Warmth to the touch. ? Pus or a bad smell. Get help right away if:  You have a fever  or chills.  Your redness, swelling, or pain at the catheter insertion site gets worse, even though you are cleaning it well.  You have leakage of bile around the drainage catheter.  Your drainage catheter becomes blocked or clogged.  Your drainage catheter comes out. This information is not intended to replace advice given to you by your health care provider. Make sure you discuss any questions you have with your health care provider. Document Released: 10/08/2012 Document Revised: 11/07/2015 Document Reviewed: 11/07/2015 Elsevier Interactive Patient Education  2017 [employer].    Moderate Conscious Sedation, Adult, Care After These instructions provide you with information about caring for yourself after your procedure. Your health care provider may also give you more specific instructions. Your treatment has been planned according to current medical practices, but problems sometimes occur. Call your health care provider if you have any problems or questions after your procedure. What can I expect after the procedure? After your procedure, it is common:  To feel sleepy for several hours.  To feel clumsy and have poor balance for several  hours.  To have poor judgment for several hours.  To vomit if you eat too soon.  Follow these instructions at home: For at least 24 hours after the procedure:   Do not: ? Participate in activities where you could fall or become injured. ? Drive. ? Use heavy machinery. ? Drink alcohol. ? Take sleeping pills or medicines that cause drowsiness. ? Make important decisions or sign legal documents. ? Take care of children on your own.  Rest. Eating and drinking  Follow the diet recommended by your health care provider.  If you vomit: ? Drink water, juice, or soup when you can drink without vomiting. ? Make sure you have little or no nausea before eating solid foods. General instructions  Have a responsible adult stay with you until you are awake and alert.  Take over-the-counter and prescription medicines only as told by your health care provider.  If you smoke, do not smoke without supervision.  Keep all follow-up visits as told by your health care provider. This is important. Contact a health care provider if:  You keep feeling nauseous or you keep vomiting.  You feel light-headed.  You develop a rash.  You have a fever. Get help right away if:  You have trouble breathing. This information is not intended to replace advice given to you by your health care provider. Make sure you discuss any questions you have with your health care provider. Document Released: 10/08/2012 Document Revised: 05/23/2015 Document Reviewed: 04/09/2015 Elsevier Interactive Patient Education  [employer].

## 2017-06-10 ENCOUNTER — Telehealth: Payer: Self-pay | Admitting: Emergency Medicine

## 2017-06-10 DIAGNOSIS — C250 Malignant neoplasm of head of pancreas: Secondary | ICD-10-CM

## 2017-06-10 NOTE — Telephone Encounter (Signed)
PTs wife called c/o of pt not eating anything. Request TPN. Per. Dr.Sherrill, he would like to see the pt tomorrow to discuss and have labs drawn. Pt wife agreed to appt. Scheduling message sent.

## 2017-06-11 ENCOUNTER — Telehealth: Payer: Self-pay | Admitting: Emergency Medicine

## 2017-06-11 ENCOUNTER — Inpatient Hospital Stay: Payer: Medicare Other | Admitting: Oncology

## 2017-06-11 ENCOUNTER — Inpatient Hospital Stay: Payer: Medicare Other

## 2017-06-11 NOTE — Telephone Encounter (Signed)
Pt wife called to cancel appts for today d/t no help transporting patient. Rescheduled for Thursday 6/13 @ 11:30. Scheduling message sent. OK per Dr.Sherrill for pt to have order for TPN. [facility] to [facility].

## 2017-06-12 ENCOUNTER — Telehealth: Payer: Self-pay | Admitting: *Deleted

## 2017-06-12 ENCOUNTER — Encounter: Payer: Self-pay | Admitting: Oncology

## 2017-06-12 NOTE — Telephone Encounter (Signed)
Patient wife called and states Dustin Lucas is too weak to come to the office tomorrow. He will not be able to start TPN until tomorrow. Wife is asking for Dr. Benay Spice to call her during Dustin Lucas's appt time tomorrow instead. He is requiring total care at this time. Home Care drew labs this afternoon, results will be faxed to this office.   Per patient wife- he will not be able to come to an office appt.

## 2017-06-12 NOTE — Telephone Encounter (Signed)
"  [employer].  I have questions I need answers to.  If nurse can call me at [phone]."    No further information with this request.

## 2017-06-13 ENCOUNTER — Inpatient Hospital Stay: Payer: Medicare Other

## 2017-06-13 ENCOUNTER — Inpatient Hospital Stay: Payer: Medicare Other | Admitting: Oncology

## 2017-06-17 ENCOUNTER — Inpatient Hospital Stay: Payer: Medicare Other

## 2017-06-17 ENCOUNTER — Inpatient Hospital Stay: Payer: Medicare Other | Admitting: Nurse Practitioner

## 2017-06-17 ENCOUNTER — Telehealth: Payer: Self-pay | Admitting: Nurse Practitioner

## 2017-06-17 NOTE — Telephone Encounter (Signed)
Scheduled appt per 6/17 los - pt wife is aware of appt date and time.

## 2017-06-18 ENCOUNTER — Telehealth: Payer: Self-pay

## 2017-06-18 DIAGNOSIS — C250 Malignant neoplasm of head of pancreas: Secondary | ICD-10-CM

## 2017-06-18 MED ORDER — OXYCODONE-ACETAMINOPHEN 5-325 MG/5ML PO SOLN: ORAL | 0 refills | Status: DC

## 2017-06-18 NOTE — Telephone Encounter (Signed)
Call from pt wife stating that pt is unable to make appt this week. Wife states  "needs a liquid pain medication because he can't swallow. Upon further assessment wife reveals that patient "gets choked when he tries to swallow pills, and even applesauce and jello".  Wife also states that "he's responding well to the TPN, but hes still weak". Wife mentioned that pt has "open sore on tailbone" and requests additional home health services. Per Dr. Benay Spice ok to initiate wound care services and requested palliative services (requested by [facility]). This RN will send liquid pain medication in to pharmacy per Dr. Benay Spice. Pt wife voiced understanding and gratitude.

## 2017-06-19 ENCOUNTER — Telehealth: Payer: Self-pay | Admitting: Nurse Practitioner

## 2017-06-19 ENCOUNTER — Inpatient Hospital Stay: Payer: Medicare Other | Admitting: Nurse Practitioner

## 2017-06-19 NOTE — Telephone Encounter (Signed)
Spoke with pt wife to inform that wound care consult and palliative care referral have been initiated. Also informed wife that liquid pain medication prescription paper is available for pick-up in office. Wife voiced understanding and gratitude.

## 2017-06-19 NOTE — Telephone Encounter (Signed)
Scheduled appt per 6/18 sch message - pt wife is ware of appt date and time.

## 2017-06-20 ENCOUNTER — Inpatient Hospital Stay: Payer: Medicare Other

## 2017-06-20 ENCOUNTER — Inpatient Hospital Stay: Payer: Medicare Other | Admitting: Nurse Practitioner

## 2017-06-21 ENCOUNTER — Telehealth: Payer: Self-pay | Admitting: *Deleted

## 2017-06-21 NOTE — Telephone Encounter (Signed)
"  Message left yesterday and order form faxed.  Awaiting return call and orders for wound care.  Wound Care Nurse is en route to patient home.   Patient has developed wound to sacrum.  Wound care Nurse recommends normal saline, barrier cream covered with moist Dakins solution dressing changed twice day."  Collaborative nurse faxed signed orders to [facility] about 10:15 am.  Notified Harlon Ditty who will notify wound care nurse of receipt of orders.  No further needs or questions at this time.

## 2017-06-24 ENCOUNTER — Telehealth: Payer: Self-pay | Admitting: *Deleted

## 2017-06-24 ENCOUNTER — Other Ambulatory Visit: Payer: Self-pay | Admitting: Nurse Practitioner

## 2017-06-24 DIAGNOSIS — C259 Malignant neoplasm of pancreas, unspecified: Secondary | ICD-10-CM

## 2017-06-24 MED ORDER — OXYCODONE HCL 5 MG/5ML PO SOLN: 5.0000 mg | ORAL | 0 refills | Status: AC | PRN

## 2017-06-24 NOTE — Telephone Encounter (Signed)
Telephone call received from wife of patient. Wound care order has not been received by [place]- wife concerned nothing is being done. States communication has not been efficient.   Patient was prescribed a "liquid medication" and she was unable to pick it up at the pharmacy due to it no longer being manufactured. History difficult to obtain.   Called the pharmacy advised the Roxicet is on backorder unitl Friday at the earliest. Patient will need something else until this becomes available. Rx will be escribed per Dr. Benay Spice.  [place]- the wound care nurse was out to the home for the evaluation this weekend. Her treatment recommendation is awaiting approval. Primary nurse is aware of new orders pending and will return to the home when the orders are signed. This office has not received the fax as of note.  Returned call to patient wife and advised above information.

## 2017-06-25 ENCOUNTER — Other Ambulatory Visit (HOSPITAL_COMMUNITY): Payer: Medicare Other

## 2017-06-26 ENCOUNTER — Telehealth: Payer: Self-pay | Admitting: *Deleted

## 2017-06-26 ENCOUNTER — Other Ambulatory Visit: Payer: Self-pay | Admitting: *Deleted

## 2017-06-26 DIAGNOSIS — C259 Malignant neoplasm of pancreas, unspecified: Secondary | ICD-10-CM

## 2017-06-26 NOTE — Telephone Encounter (Signed)
FYI Declined Triage offer for assistance.   "Returnng call to nurse I've talked with everyday.  Calling about several things.  Some things are taken care of."  Earlier voicemail:  "There's a conflict in scheduling.  Calling to confirm appointment.  Is Andrei scheduled tomorrow at 10:30 am or July 2nd at 10:30 am?"  Caller transferrerd as requested.

## 2017-06-27 ENCOUNTER — Inpatient Hospital Stay: Payer: Medicare Other

## 2017-06-27 ENCOUNTER — Encounter: Payer: Self-pay | Admitting: Nurse Practitioner

## 2017-06-27 ENCOUNTER — Inpatient Hospital Stay (HOSPITAL_BASED_OUTPATIENT_CLINIC_OR_DEPARTMENT_OTHER): Payer: Medicare Other | Admitting: Nurse Practitioner

## 2017-06-27 ENCOUNTER — Other Ambulatory Visit: Payer: Medicare Other

## 2017-06-27 VITALS — BP 82/60 | HR 102 | Temp 98.4°F | Resp 18 | Ht 75.0 in

## 2017-06-27 DIAGNOSIS — C250 Malignant neoplasm of head of pancreas: Secondary | ICD-10-CM

## 2017-06-27 DIAGNOSIS — B961 Klebsiella pneumoniae [K. pneumoniae] as the cause of diseases classified elsewhere: Secondary | ICD-10-CM

## 2017-06-27 DIAGNOSIS — Z95828 Presence of other vascular implants and grafts: Secondary | ICD-10-CM

## 2017-06-27 DIAGNOSIS — N390 Urinary tract infection, site not specified: Secondary | ICD-10-CM | POA: Diagnosis not present

## 2017-06-27 DIAGNOSIS — C259 Malignant neoplasm of pancreas, unspecified: Secondary | ICD-10-CM

## 2017-06-27 LAB — CMP (CANCER CENTER ONLY)
ALBUMIN: 1.7 g/dL — AB (ref 3.5–5.0)
ALK PHOS: 257 U/L — AB (ref 38–126)
ALT: 934 U/L (ref 0–44)
AST: 853 U/L (ref 15–41)
Anion gap: 8 (ref 5–15)
BILIRUBIN TOTAL: 2.3 mg/dL — AB (ref 0.3–1.2)
BUN: 36 mg/dL — ABNORMAL HIGH (ref 8–23)
CALCIUM: 8.9 mg/dL (ref 8.9–10.3)
CO2: 25 mmol/L (ref 22–32)
Chloride: 101 mmol/L (ref 98–111)
Creatinine: 0.68 mg/dL (ref 0.61–1.24)
GFR, Est AFR Am: >60 mL/min (ref >60)
GFR, Estimated: >60 mL/min (ref >60)
GLUCOSE: 78 mg/dL (ref 70–99)
POTASSIUM: 4.7 mmol/L (ref 3.5–5.1)
Sodium: 134 mmol/L — ABNORMAL LOW (ref 135–145)
TOTAL PROTEIN: 6.9 g/dL (ref 6.5–8.1)

## 2017-06-27 LAB — URINALYSIS, COMPLETE (UACMP) WITH MICROSCOPIC
Bilirubin Urine: NEGATIVE
GLUCOSE, UA: NEGATIVE mg/dL
Ketones, ur: NEGATIVE mg/dL
Nitrite: POSITIVE — AB
Protein, ur: 100 mg/dL — AB
SPECIFIC GRAVITY, URINE: 1.02 (ref 1.005–1.030)
WBC, UA: >50 WBC/hpf — ABNORMAL HIGH (ref 0–5)
pH: 6 (ref 5.0–8.0)

## 2017-06-27 LAB — CBC WITH DIFFERENTIAL (CANCER CENTER ONLY)
BASOS PCT: 0 %
Basophils Absolute: 0 10*3/uL (ref 0.0–0.1)
EOS ABS: 0 10*3/uL (ref 0.0–0.5)
Eosinophils Relative: 1 %
HEMATOCRIT: 35.2 % — AB (ref 38.4–49.9)
Hemoglobin: 11.5 g/dL — ABNORMAL LOW (ref 13.0–17.1)
LYMPHS ABS: 1.3 10*3/uL (ref 0.9–3.3)
Lymphocytes Relative: 21 %
MCH: 30.7 pg (ref 27.2–33.4)
MCHC: 32.7 g/dL (ref 32.0–36.0)
MCV: 93.9 fL (ref 79.3–98.0)
MONO ABS: 0.5 10*3/uL (ref 0.1–0.9)
MONOS PCT: 8 %
NEUTROS ABS: 4.4 10*3/uL (ref 1.5–6.5)
Neutrophils Relative %: 70 %
Platelet Count: 241 10*3/uL (ref 140–400)
RBC: 3.75 MIL/uL — ABNORMAL LOW (ref 4.20–5.82)
RDW: 15.8 % — AB (ref 11.0–14.6)
WBC Count: 6.2 10*3/uL (ref 4.0–10.3)

## 2017-06-27 MED ORDER — CIPROFLOXACIN HCL 250 MG PO TABS: 250.0000 mg | ORAL_TABLET | Freq: Two times a day (BID) | ORAL | 0 refills | Status: DC

## 2017-06-27 MED ORDER — SODIUM CHLORIDE 0.9% FLUSH
10.0000 mL | INTRAVENOUS | Status: DC | PRN
  Administered 2017-06-27: 10 mL via INTRAVENOUS
  Filled 2017-06-27: qty 10

## 2017-06-27 NOTE — Progress Notes (Signed)
Per Ned Card, NP instructed to call patient and inform that TPN will be weaned and stopped quickly and safely due to critical AST/ALP values.  Spoke with wife, she was upset as to why communication was not being done with [place].  Advised communication will occur with them and her since she is the primary caregiver.    [place] spoke with Marijean Heath, RN [phone].  She is the nurse doing a home visit today with the patient for wound dressing change.  She provided [facility] number and instructed me to call them and they will instruct her on the dose reduction for the TPN.  She also advised that the patient has been getting weekly labs, I advised we have not been receiving any notfiication of the weekly labs (she stated she would let Melissa in the office know to fax all of the recent labs over to [place] and from now on).  She will recheck labs on 07/01/2017 Monday and notify us for improved Liver Functions.    Labs 06/12/2017 AST 45 ALT51 Alkaline Phosphatase 127 (completed at [facility]) Labs 06/24/2017 AST 100 ALT 135 Alkaline Phosphatase 148 (completed at [facility])  Spoke with Amy pharmacist at [facility] [phone].  They will do a dose reduction and hold for a couple of hours and then do another dose reduction and then stop the TPN.  They will supplement with D5NS at 50 ml/hr continuously until labs and TPN are reinstated and resolved.    Stressed the importance of constant communication in regards to the patient with our office.

## 2017-06-27 NOTE — Progress Notes (Addendum)
[facility] OFFICE PROGRESS NOTE   Diagnosis: Pancreas cancer  INTERVAL HISTORY:   Dustin Lucas returns for follow-up.  He has started TPN.  His wife thinks that overall he is better since beginning TPN.  She is concerned that he may be dehydrated.  He reports a poor energy level.  He spends the majority of the day in bed.  Appetite is poor.  He states he is not hungry.  His wife notes that his urine appears infected.  He is having pain at the low pelvis.  Objective:  Vital signs in last 24 hours:  Blood pressure (!) 82/60, pulse (!) 102, temperature 98.4 F (36.9 C), temperature source Oral, resp. rate 18, height 6\' 3"  (1.905 m), SpO2 99 %.    HEENT: No thrush or ulcers. Resp: Lungs clear bilaterally. Cardio: Regular rate and rhythm. GI: No hepatomegaly.  Abdomen does not appear distended. Vascular: No leg edema. Neuro: Alert and oriented. Port-A-Cath without erythema.  Lab Results:  Lab Results  Component Value Date   WBC 6.2 06/27/2017   HGB 11.5 (L) 06/27/2017   HCT 35.2 (L) 06/27/2017   MCV 93.9 06/27/2017   PLT 241 06/27/2017   NEUTROABS 4.4 06/27/2017    Imaging:  No results found.  Medications: I have reviewed the patient's current medications.  Assessment/Plan: 1. Pancreas cancer-presenting with obstructive jaundice  CT 08/18/2015 with intra-and extrahepatic Dillard dilatation and dilatation of the pancreas duct, vague fullness at the head of the pancreas  EUS 10/05/2015 confirmed a pancreas head mass with no evidence of vascular invasion and no peripancreatic adenopathy (uT2,uN0)  EUS biopsy of the pancreas head mass on 10/05/2015 confirmed adenocarcinoma  CT chest/abdomen at [facility] 11/16/2015-hypoenhancing mass in the head and uncinate process of the pancreas with increased markedpancreatic ductal dilatation and greater than 180 encasement of the superior mesenteric artery and superior mesenteric vein above the level of the first  tributary.  Cycle 1 FOLFOX 12/12/2015  Cycle 2 FOLFOX 12/27/2015  Cycle 3 FOLFOX 01/24/2016-Neulasta added  Cycle 4 FOLFOX 02/14/2016  Restaging CT scans 03/02/2016-decreasing size of pancreatic head/uncinate process mass with decreased encasement of the SMV (less than 180) and decreased pancreatic ductal dilatation. There remains greater than 180 of encasement of the SMA which is patent. Unchanged position of colon bile duct stent with decreasing right-sided pneumobilia. Stable 6 mm right upper lobe nodule.  Cycle 5 FOLFOX 03/13/2016 (oxaliplatin on hold due to poor tolerance)  Cycle 6 FOLFOX 03/27/2016 (oxaliplatin held)  Cycle 7 FOLFOX 04/10/2016 (oxaliplatin held)  Cycle 8 FOLFOX 04/24/2016 (oxaliplatin held)  MRI of the abdomen at [facility] on 05/23/2016 showed unchanged pancreatic head mass as compared to 05/02/2016 CT with similar encasement of the SMA and abutment/encasement of the SMV. No evidence of metastatic disease in the upper abdomen.   He completed a course of radiation delivered over 5 fractions on 06/18/2016 at [facility].  MRI abdomen at [facility] 10/30/2016-mildincrease in size of the pancreas head mass with encasement of the SMA and SMV, no evidence of metastatic disease  CT chest, abdomen, pelvis 04/15/2017-mass at the medial pancreas head/uncinate 8 with encasement of the SMA, mild to moderate intrahepatic and extrahepatic ductal dilatation  CTs at [facility] 04/30/2017-slight increased size of the pancreas head mass, encasement of the SMA, increase compression of the portal confluence and narrowing/occlusion of the superior mesenteric vein, no new site of disease   2. Obstructive jaundice secondary to #1-status postplacement of a bile duct stent 10/05/2015  Recurrent obstructive jaundice May 2019  Aborted attempted ERCP 05/15/2017 secondary to stenosis/ulceration at the pyloric channel  Internal/external biliary drain placed 05/17/2017  Distal common bile duct internal  stent placed and right internal/external biliary drain removed 06/05/2017   3. Hypertension  4. Anorexia/weight loss secondary to #1.Trial of Marinol initiated 05/23/2017.  On TPN.  5. 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  8.Klebsiella urinary tract infection 05/17/2017   Disposition: Mr. Erbe continues to have a poor performance status.  He and his wife understand he is not a candidate for chemotherapy in his current condition.  He will continue TPN and return for a follow-up visit in approximately 2 weeks for reevaluation.  Urinalysis is consistent with infection.  He will complete a 5-day course of ciprofloxacin 250 mg twice daily.  We will repeat a urinalysis at the time of his next visit.  He is being followed by a wound care nurse for a sacral ulcer.  He was unable to stand for [place] to examine this today.  He will return for lab, follow-up and possible chemotherapy on 07/15/2017.  He will contact the office in the interim with any problems.  Patient seen with Dr. Benay Spice.    Ned Card ANP/GNP-BC   06/27/2017  11:22 AM This was a shared visit with Ned Card.  Mr. Walck continues to have a poor performance status.  He is not a candidate for chemotherapy at present.  He will be treated for a urinary tract infection. We received a chemistry panel after he left the office today.  Liver enzymes are again markedly elevated.  This may be related to TPN.  We will discontinue the TPN and continue home IV fluids.  He will be referred for repeat study of the biliary tract of the liver enzymes remain elevated.  25 minutes were spent with the patient today.  The majority of the time was used for counseling and coordination of care.

## 2017-06-28 ENCOUNTER — Telehealth: Payer: Self-pay | Admitting: Oncology

## 2017-06-28 NOTE — Telephone Encounter (Signed)
Called patient and gave update on appointments per 6/27 los/staff message

## 2017-06-29 LAB — URINE CULTURE: Culture: >=100000 — AB

## 2017-07-02 ENCOUNTER — Telehealth: Payer: Self-pay | Admitting: Emergency Medicine

## 2017-07-02 NOTE — Telephone Encounter (Addendum)
Home health nurse will go out on 7/3 to obtain this. Verbal given   ----- Message from Owens Shark, NP sent at 07/01/2017  4:15 PM EDT ----- He needs to repeat a urinalysis and culture.  Please contact [facility] home care to see if they can do this.

## 2017-07-09 ENCOUNTER — Other Ambulatory Visit: Payer: Self-pay | Admitting: Nurse Practitioner

## 2017-07-09 DIAGNOSIS — C250 Malignant neoplasm of head of pancreas: Secondary | ICD-10-CM

## 2017-07-09 MED ORDER — AMOXICILLIN-POT CLAVULANATE 500-125 MG PO TABS: 1.0000 | ORAL_TABLET | Freq: Two times a day (BID) | ORAL | 0 refills | Status: AC

## 2017-07-10 ENCOUNTER — Telehealth: Payer: Self-pay | Admitting: *Deleted

## 2017-07-10 DIAGNOSIS — C250 Malignant neoplasm of head of pancreas: Secondary | ICD-10-CM

## 2017-07-10 DIAGNOSIS — Z95828 Presence of other vascular implants and grafts: Secondary | ICD-10-CM

## 2017-07-10 MED ORDER — LIDOCAINE-PRILOCAINE 2.5-2.5 % EX CREA: TOPICAL_CREAM | CUTANEOUS | 2 refills | Status: DC

## 2017-07-10 NOTE — Telephone Encounter (Signed)
"  Sonia RN, [place] calling to request a refill be called in for Emla Cream.  Patient's wife states lost tube of Lidocaine cream that came with the port-a-cath when inserted.  Port-a cath will be accessed weekly on every Monday."  Order sent.  Sonia notified [place] numbs area with ice in the event no Emla Cream placed for access.  May not be able to obtain today if Prior Authorization is needed.  Alleen Borne states she will try ice if not received by 07-15-2017.

## 2017-07-15 ENCOUNTER — Inpatient Hospital Stay: Payer: Medicare Other

## 2017-07-15 ENCOUNTER — Encounter: Payer: Self-pay | Admitting: Oncology

## 2017-07-15 ENCOUNTER — Inpatient Hospital Stay: Payer: Medicare Other | Attending: Oncology

## 2017-07-15 ENCOUNTER — Inpatient Hospital Stay: Payer: Medicare Other | Admitting: Nutrition

## 2017-07-22 ENCOUNTER — Telehealth: Payer: Self-pay | Admitting: *Deleted

## 2017-07-22 NOTE — Telephone Encounter (Signed)
Telephone call received from [place], Home care RN. Patient states he has been suffering from muscle spasms in his legs for the past 2 weeks. He has not been able to sleep at night as they wake him up. Wife confirms. Patient is still not mobile. He can not stand and will not travel. Wife states she does not have resources to bring him in to see the Dr. Eston Esters forwarded to Dr Benay Spice.

## 2017-07-26 ENCOUNTER — Encounter: Payer: Self-pay | Admitting: Oncology

## 2017-07-26 ENCOUNTER — Telehealth: Payer: Self-pay | Admitting: Oncology

## 2017-07-26 ENCOUNTER — Other Ambulatory Visit: Payer: Self-pay | Admitting: Emergency Medicine

## 2017-07-26 MED ORDER — MEGESTROL ACETATE 40 MG/ML PO SUSP: 200.0000 mg | Freq: Two times a day (BID) | ORAL | 0 refills | Status: DC

## 2017-07-26 NOTE — Telephone Encounter (Signed)
I called Ms. Richardson Landry to check on Samrat Hayward.  We have not seen him for the past month.  He has been unable to travel to the Cancer center.  His liver enzymes remain elevated, potentially due to TPN versus biliary obstruction.  Ms. Barrick indicates he is eating small amounts.  She is concerned that he is not receiving treatment for cancer.  She hopes he will be able to receive treatment for the cancer.  I explained his current performance status is not adequate to receive chemotherapy.  We discussed the lack of a proven benefit with TPN and placement of a feeding tube with regard to survival and cancer outcome.  Ms. Gladd is concerned the TPN is causing the liver enzymes to be high and preventing him from receiving chemotherapy.  She understands no therapy will be curative.  We decided to begin a trial of Megace as an appetite stimulant.  They understand the increased risk of thromboembolic disease with Megace.  The TPN will be weaned to off.  We are glad to see Mr. Clear at any time.  He will be scheduled for an office visit here on 08/06/2017.  I am available to see him in the interim as needed.  I plan to discuss Hospice care with Mr. Loftus if his performance status has not improved when he is here on 08/06/2017.  Julieanne Manson, MD

## 2017-07-29 ENCOUNTER — Telehealth: Payer: Self-pay | Admitting: Nurse Practitioner

## 2017-07-29 NOTE — Telephone Encounter (Signed)
Scheduled appt per 7/26 sch message - pt is aware of appt date and time.

## 2017-07-29 NOTE — Progress Notes (Unsigned)
PA for Megestrol Acetate 40 mg/ml has been submitted.

## 2017-08-06 ENCOUNTER — Telehealth: Payer: Self-pay | Admitting: Emergency Medicine

## 2017-08-06 ENCOUNTER — Inpatient Hospital Stay: Payer: Medicare Other | Attending: Oncology | Admitting: Nurse Practitioner

## 2017-08-06 NOTE — Telephone Encounter (Signed)
Spoke to Ms.Greeno regarding missed appointment today. She states she called [facility] and left a VM stating she could not come in today d/t Mr.Moody having pain in his neck at 2am this morning requiring pain medication. She states he was to drowsy to come into the appt today and would like to reschedule. Scheduling message sent to call Ms.Ringstad to do so. Pt also states that Mr.Hodgkin has been eating a little more and feels that the megace is helping. MD made aware .

## 2017-08-07 ENCOUNTER — Telehealth: Payer: Self-pay | Admitting: Oncology

## 2017-08-07 NOTE — Telephone Encounter (Signed)
Scheduled appt per 8/6 sch message - pt is aware of appt date and time.

## 2017-08-12 ENCOUNTER — Emergency Department (HOSPITAL_COMMUNITY): Payer: Medicare Other

## 2017-08-12 ENCOUNTER — Emergency Department (HOSPITAL_COMMUNITY)
Admission: EM | Admit: 2017-08-12 | Discharge: 2017-08-12 | Disposition: A | Discharge status: Home / Self Care | Payer: Medicare Other | Attending: Emergency Medicine | Admitting: Emergency Medicine

## 2017-08-12 ENCOUNTER — Inpatient Hospital Stay: Payer: Medicare Other | Admitting: Oncology

## 2017-08-12 ENCOUNTER — Other Ambulatory Visit: Payer: Self-pay

## 2017-08-12 ENCOUNTER — Encounter (HOSPITAL_COMMUNITY): Payer: Self-pay | Admitting: *Deleted

## 2017-08-12 DIAGNOSIS — Z96641 Presence of right artificial hip joint: Secondary | ICD-10-CM | POA: Insufficient documentation

## 2017-08-12 DIAGNOSIS — N3001 Acute cystitis with hematuria: Secondary | ICD-10-CM | POA: Insufficient documentation

## 2017-08-12 DIAGNOSIS — I10 Essential (primary) hypertension: Secondary | ICD-10-CM | POA: Insufficient documentation

## 2017-08-12 DIAGNOSIS — E860 Dehydration: Secondary | ICD-10-CM | POA: Insufficient documentation

## 2017-08-12 DIAGNOSIS — J45909 Unspecified asthma, uncomplicated: Secondary | ICD-10-CM | POA: Diagnosis not present

## 2017-08-12 DIAGNOSIS — Z79899 Other long term (current) drug therapy: Secondary | ICD-10-CM | POA: Insufficient documentation

## 2017-08-12 DIAGNOSIS — R252 Cramp and spasm: Secondary | ICD-10-CM | POA: Diagnosis present

## 2017-08-12 DIAGNOSIS — B370 Candidal stomatitis: Secondary | ICD-10-CM | POA: Diagnosis not present

## 2017-08-12 DIAGNOSIS — Z8546 Personal history of malignant neoplasm of prostate: Secondary | ICD-10-CM | POA: Insufficient documentation

## 2017-08-12 LAB — CBC WITH DIFFERENTIAL/PLATELET
Basophils Absolute: 0 10*3/uL (ref 0.0–0.1)
Basophils Relative: 0 %
EOS PCT: 0 %
Eosinophils Absolute: 0 10*3/uL (ref 0.0–0.7)
HCT: 35 % — ABNORMAL LOW (ref 39.0–52.0)
HEMOGLOBIN: 11.8 g/dL — AB (ref 13.0–17.0)
Lymphocytes Relative: 10 %
Lymphs Abs: 0.9 10*3/uL (ref 0.7–4.0)
MCH: 29.3 pg (ref 26.0–34.0)
MCHC: 33.7 g/dL (ref 30.0–36.0)
MCV: 86.8 fL (ref 78.0–100.0)
Monocytes Absolute: 0.5 10*3/uL (ref 0.1–1.0)
Monocytes Relative: 6 %
NEUTROS PCT: 84 %
Neutro Abs: 7.4 10*3/uL (ref 1.7–7.7)
PLATELETS: 280 10*3/uL (ref 150–400)
RBC: 4.03 MIL/uL — AB (ref 4.22–5.81)
RDW: 16.9 % — ABNORMAL HIGH (ref 11.5–15.5)
WBC: 8.8 10*3/uL (ref 4.0–10.5)

## 2017-08-12 LAB — COMPREHENSIVE METABOLIC PANEL
ALBUMIN: 2.2 g/dL — AB (ref 3.5–5.0)
ALK PHOS: 110 U/L (ref 38–126)
ALT: 17 U/L (ref 0–44)
AST: 26 U/L (ref 15–41)
Anion gap: 10 (ref 5–15)
BUN: 29 mg/dL — ABNORMAL HIGH (ref 8–23)
CALCIUM: 9.2 mg/dL (ref 8.9–10.3)
CO2: 23 mmol/L (ref 22–32)
CREATININE: 0.84 mg/dL (ref 0.61–1.24)
Chloride: 109 mmol/L (ref 98–111)
GFR calc Af Amer: >60 mL/min (ref >60)
GFR calc non Af Amer: >60 mL/min (ref >60)
GLUCOSE: 107 mg/dL — AB (ref 70–99)
Potassium: 4.2 mmol/L (ref 3.5–5.1)
SODIUM: 142 mmol/L (ref 135–145)
Total Bilirubin: 1.2 mg/dL (ref 0.3–1.2)
Total Protein: 7.7 g/dL (ref 6.5–8.1)

## 2017-08-12 LAB — URINALYSIS, ROUTINE W REFLEX MICROSCOPIC
GLUCOSE, UA: NEGATIVE mg/dL
KETONES UR: 15 mg/dL — AB
NITRITE: POSITIVE — AB
Specific Gravity, Urine: >1.03 — ABNORMAL HIGH (ref 1.005–1.030)
pH: 5 (ref 5.0–8.0)

## 2017-08-12 LAB — URINALYSIS, MICROSCOPIC (REFLEX)

## 2017-08-12 LAB — CK: Total CK: 13 U/L — ABNORMAL LOW (ref 49–397)

## 2017-08-12 MED ORDER — LIDOCAINE 5 % EX OINT: 1.0000 "application " | TOPICAL_OINTMENT | CUTANEOUS | 0 refills | Status: AC | PRN

## 2017-08-12 MED ORDER — LIDOCAINE 5 % EX PTCH: 1.0000 | MEDICATED_PATCH | CUTANEOUS | 0 refills | Status: DC

## 2017-08-12 MED ORDER — DOXYCYCLINE HYCLATE 100 MG PO CAPS: 100.0000 mg | ORAL_CAPSULE | Freq: Two times a day (BID) | ORAL | 0 refills | Status: AC

## 2017-08-12 MED ORDER — CEPHALEXIN 500 MG PO CAPS: 500.0000 mg | ORAL_CAPSULE | Freq: Two times a day (BID) | ORAL | 0 refills | Status: AC

## 2017-08-12 MED ORDER — NYSTATIN 100000 UNIT/ML MT SUSP: 5.0000 mL | Freq: Four times a day (QID) | OROMUCOSAL | 0 refills | Status: AC

## 2017-08-12 MED ORDER — SODIUM CHLORIDE 0.9 % IV BOLUS
500.0000 mL | Freq: Once | INTRAVENOUS | Status: AC
  Administered 2017-08-12: 500 mL via INTRAVENOUS

## 2017-08-12 MED ORDER — SODIUM CHLORIDE 0.9 % IV SOLN
1.0000 g | Freq: Once | INTRAVENOUS | Status: AC
  Administered 2017-08-12: 1 g via INTRAVENOUS
  Filled 2017-08-12: qty 10

## 2017-08-12 NOTE — ED Notes (Addendum)
Pt and pt's family are aware that a urine sample is needed.

## 2017-08-12 NOTE — ED Triage Notes (Signed)
Per EMS, pt sent from doctor's office. Pt has not had much to eat or drink in the past 2 weeks. Pt is being treated for pancreatic cancer. Pt's PCP wanted pt to be seen for dehydration and possible hospice placement.

## 2017-08-12 NOTE — ED Notes (Signed)
Patient aware we need urine sample. Urine sample at bedside.

## 2017-08-12 NOTE — ED Notes (Signed)
[facility] here for transport to home

## 2017-08-12 NOTE — ED Notes (Addendum)
Pt. Documented in error DG Chest 2 View. 

## 2017-08-12 NOTE — Discharge Instructions (Addendum)
Thank you for allowing me to care for you today in the Emergency Department.   You should receive a call from the home hospice team tomorrow so please make sure to keep your phone with you.   To treat his UTI, take 1 tablet of Keflex 2 times daily.  We will also cover him with doxycycline, which is taken 1 tab 2 times daily.  Both medications are taken for 1 week.  Please follow-up with primary care to have a repeat urinalysis performed after his infection improves.  For his sore throat, swallow 5 mL of nystatin 4 times daily.  You can also swish and spit with warm salt water to help with this infection.  Guaifenesin is available over-the-counter and can also help with chest congestion and cough.  Apply a thin layer of lidocaine ointment as needed to areas that are sore.  Pro-stat is a medical supplement that can help with nutrition.  I have attached information about this product along with your discharge paperwork, including the website.  Return to the emergency department if you develop a fever, chills, burning or worsening pain with urination, or other new, concerning symptoms.

## 2017-08-12 NOTE — ED Notes (Signed)
Pt. Made aware for the need of urine specimen. 

## 2017-08-12 NOTE — ED Provider Notes (Signed)
   Face-to-face evaluation   History: Patient sent here by his PCP office to initiate hospice treatment.  Family would like to do that in the home setting.  They are also interested in a "second opinion from a different oncologist."  Physical exam: Frail elderly man who is alert and responsive.  He is unable to give history.  Heart regular rate and rhythm without murmur lungs clear to auscultation anteriorly.  Abdomen has normal bowel sounds and is soft.  Medical screening examination/treatment/procedure(s) were conducted as a shared visit with non-physician practitioner(s) and myself.  I personally evaluated the patient during the encounter     Daleen Bo, MD 08/13/17 1053

## 2017-08-12 NOTE — ED Provider Notes (Signed)
[facility] DEPT Provider Note   CSN: [id number] Arrival date & time: 08/12/17  1400     History   Chief Complaint Chief Complaint  Patient presents with  . Dehydration    HPI Dustin Lucas is a 77 y.o. male with a h/o of pancreatic adenocarcinoma, HTN, recurrent UTIs, and HTN who presents to the emergency department by EMS from his PCPs office with a chief complaint of poor PO intake.  The patient's wife reports the patient has not been eating or drinking well for the last 2 weeks.  She reports that when he has had a similar that he has been dehydrated and has required treatment.  She also reports the patient has had pain that radiates across the bilateral shoulder blades with a productive cough for the last few days.  She reports he has been using an incentive spirometer at home.  She also reports that he has been complaining of diffuse muscle spasms.  She denies dyspnea, chest pain, nausea, vomiting, fever, or chills.  In the room, the patient is placed on 2 L nasal cannula, which his wife states that he does not use at home.  She states that he was placed on oxygen by ED staff after they were unable to accurately obtain a pulse ox.  The history is provided by the patient. No language interpreter was used.    Past Medical History:  Diagnosis Date  . Arthritis   . Asthma    as a child   . Cancer ([place]) 7-8 yrs ago   prostate cancer, monitoring psa levels q 6 months; pancreatic cancer   . Hx of radiation therapy   . Hypertension   . PONV (postoperative nausea and vomiting)    in past , none recent  . Urinary frequency     Patient Active Problem List   Diagnosis Date Noted  . Protein-calorie malnutrition, severe 05/17/2017  . Cholestasis 05/16/2017  . Obstructive cholestatic liver disease 05/16/2017  . Goals of care, counseling/discussion 05/01/2017  . BPH with obstruction/lower urinary tract symptoms 12/18/2016  . Port catheter in place  12/12/2015  . Primary cancer of head of pancreas ([place]) 10/12/2015  . Essential hypertension, benign 08/21/2015  . Hypokalemia 08/21/2015  . Cholelithiasis without cholecystitis   . Jaundice 08/18/2015  . Abnormal LFTs 08/18/2015    Past Surgical History:  Procedure Laterality Date  . ENDOSCOPIC RETROGRADE CHOLANGIOPANCREATOGRAPHY (ERCP) WITH PROPOFOL N/A 05/15/2017   Procedure: ABORTED ENDOSCOPIC RETROGRADE CHOLANGIOPANCREATOGRAPHY (ERCP) WITH PROPOFOL;  Surgeon: Ronnette Juniper, MD;  Location: [facility];  Service: Gastroenterology;  Laterality: N/A;  . ERCP N/A 08/21/2015   Procedure: ENDOSCOPIC RETROGRADE CHOLANGIOPANCREATOGRAPHY (ERCP);  Surgeon: Clarene Essex, MD;  Location: [facility];  Service: Endoscopy;  Laterality: N/A;  . ERCP N/A 08/20/2015   Procedure: ENDOSCOPIC RETROGRADE CHOLANGIOPANCREATOGRAPHY (ERCP);  Surgeon: Carol Ada, MD;  Location: [facility];  Service: Endoscopy;  Laterality: N/A;  . ERCP N/A 10/05/2015   Procedure: ENDOSCOPIC RETROGRADE CHOLANGIOPANCREATOGRAPHY (ERCP);  Surgeon: Arta Silence, MD;  Location: [facility];  Service: Endoscopy;  Laterality: N/A;  Dr. Watt Climes to do ERCP  . ESOPHAGOGASTRODUODENOSCOPY N/A 05/15/2017   Procedure: ESOPHAGOGASTRODUODENOSCOPY (EGD);  Surgeon: Ronnette Juniper, MD;  Location: [facility];  Service: Gastroenterology;  Laterality: N/A;  . ESOPHAGOGASTRODUODENOSCOPY (EGD) WITH PROPOFOL N/A 08/29/2015   Procedure: ESOPHAGOGASTRODUODENOSCOPY (EGD) WITH PROPOFOL;  Surgeon: Arta Silence, MD;  Location: [facility];  Service: Endoscopy;  Laterality: N/A;  . EUS N/A 10/05/2015   Procedure: UPPER ENDOSCOPIC ULTRASOUND (EUS) RADIAL;  Surgeon:  Arta Silence, MD;  Location: [facility];  Service: Endoscopy;  Laterality: N/A;  . FRACTURE SURGERY Left    plate in ankle  . IR BILIARY STENT(S) EXISTING ACCESS INC DILATION CATH EXCHANGE  06/05/2017  . IR GENERIC HISTORICAL  12/08/2015   IR US GUIDE VASC ACCESS RIGHT 12/08/2015 Corrie Mckusick, DO  [facility]  . IR GENERIC HISTORICAL  12/08/2015   IR FLUORO GUIDE PORT INSERTION RIGHT 12/08/2015 Corrie Mckusick, DO [facility]  . IR INT EXT BILIARY DRAIN WITH CHOLANGIOGRAM  05/17/2017  . JOINT REPLACEMENT     hip replacement  right  . TONSILLECTOMY    . TOTAL HIP REVISION Right 08/09/2014   Procedure: TOTAL HIP REVISION;  Surgeon: Frederik Pear, MD;  Location: [facility];  Service: Orthopedics;  Laterality: Right;  REVISION (ASR) RIGHT TOTAL HIP ARHROPLASTY**INNOMET OSTEOTONES  . TRANSURETHRAL RESECTION OF PROSTATE N/A 12/18/2016   Procedure: TRANSURETHRAL RESECTION OF THE PROSTATE (TURP);  Surgeon: Irine Seal, MD;  Location: [facility];  Service: Urology;  Laterality: N/A;  . UPPER ESOPHAGEAL ENDOSCOPIC ULTRASOUND (EUS) N/A 08/29/2015   Procedure: UPPER ESOPHAGEAL ENDOSCOPIC ULTRASOUND (EUS);  Surgeon: Arta Silence, MD;  Location: [facility];  Service: Endoscopy;  Laterality: N/A;        Home Medications    Prior to Admission medications   Medication Sig Start Date End Date Taking? Authorizing Provider  acetaminophen (TYLENOL) 100 MG/ML solution Take 500 mg by mouth daily as needed for pain.    Yes [provider]  loperamide (IMODIUM A-D) 2 MG tablet Take 2 mg by mouth 4 (four) times daily as needed for diarrhea or loose stools.   Yes [provider]  megestrol (MEGACE ORAL) 40 MG/ML suspension Take 5 mLs (200 mg total) by mouth 2 (two) times daily. 07/26/17  Yes Ladell Pier, MD  oxyCODONE (ROXICODONE) 5 MG/5ML solution Take 5 mLs (5 mg total) by mouth every 4 (four) hours as needed for severe pain. 06/24/17  Yes Alla Feeling, NP  cephALEXin (KEFLEX) 500 MG capsule Take 1 capsule (500 mg total) by mouth 2 (two) times daily for 7 days. 08/12/17 08/15/2017  McDonald, Mia A, PA-C  doxycycline (VIBRAMYCIN) 100 MG capsule Take 1 capsule (100 mg total) by mouth 2 (two) times daily. 08/12/17   McDonald, Mia A, PA-C  lidocaine (XYLOCAINE) 5 % ointment Apply 1  application topically as needed. 08/12/17   McDonald, Mia A, PA-C  nystatin (MYCOSTATIN) 100000 UNIT/ML suspension Take 5 mLs (500,000 Units total) by mouth 4 (four) times daily. 08/12/17   McDonald, Mia A, PA-C  oxyCODONE-acetaminophen (PERCOCET/ROXICET) 5-325 MG tablet Take 0.5-1 tablets by mouth every 4 (four) hours as needed for severe pain. Patient not taking: Reported on 06/27/2017 05/13/17   Owens Shark, NP    Family History No family history on file.  Social History Social History   Tobacco Use  . Smoking status: Never Smoker  . Smokeless tobacco: Never Used  Substance Use Topics  . Alcohol use: No  . Drug use: No     Allergies   Shellfish allergy   Review of Systems Review of Systems  Constitutional: Positive for appetite change. Negative for chills and fever.  Respiratory: Negative for shortness of breath.   Cardiovascular: Negative for chest pain.  Gastrointestinal: Negative for abdominal pain, diarrhea, nausea and vomiting.  Genitourinary: Negative for dysuria.  Musculoskeletal: Positive for myalgias. Negative for back pain.  Skin: Negative for rash.  Allergic/Immunologic: Negative for immunocompromised state.  Neurological:  Negative for headaches.  Psychiatric/Behavioral: Negative for confusion.     Physical Exam Updated Vital Signs BP 108/76 (BP Location: Left Arm)   Pulse (!) 103   Temp 97.9 F (36.6 C) (Oral)   Resp 16   SpO2 100%   Physical Exam  Constitutional: He appears well-developed.  Cachetic, chronically ill-appearing male. NAD.   HENT:  Head: Normocephalic.  Temporal wasting bilaterally.  White patches noted to the dorsum of the tongue.  Eyes: Conjunctivae are normal. No scleral icterus.  Neck: Normal range of motion. Neck supple.  Cardiovascular: Regular rhythm, normal heart sounds and intact distal pulses. Tachycardia present. Exam reveals no gallop and no friction rub.  No murmur heard. Pulmonary/Chest: Effort normal. No stridor.  No respiratory distress. He exhibits no tenderness.  Abdominal: Soft. He exhibits no distension and no mass. There is no tenderness. There is no rebound and no guarding. No hernia.  Genitourinary:  Genitourinary Comments: Purulent discharge noted at the urethral meatus.  Neurological: He is alert.  Skin: Skin is warm and dry.  Psychiatric: His behavior is normal.  Nursing note and vitals reviewed.  ED Treatments / Results  Labs (all labs ordered are listed, but only abnormal results are displayed) Labs Reviewed  COMPREHENSIVE METABOLIC PANEL - Abnormal; Notable for the following components:      Result Value   Glucose, Bld 107 (*)    BUN 29 (*)    Albumin 2.2 (*)    All other components within normal limits  CBC WITH DIFFERENTIAL/PLATELET - Abnormal; Notable for the following components:   RBC 4.03 (*)    Hemoglobin 11.8 (*)    HCT 35.0 (*)    RDW 16.9 (*)    All other components within normal limits  CK - Abnormal; Notable for the following components:   Total CK 13 (*)    All other components within normal limits  URINALYSIS, ROUTINE W REFLEX MICROSCOPIC - Abnormal; Notable for the following components:   Color, Urine BROWN (*)    APPearance TURBID (*)    Specific Gravity, Urine >1.030 (*)    Hgb urine dipstick LARGE (*)    Bilirubin Urine MODERATE (*)    Ketones, ur 15 (*)    Protein, ur >300 (*)    Nitrite POSITIVE (*)    Leukocytes, UA MODERATE (*)    All other components within normal limits  URINALYSIS, MICROSCOPIC (REFLEX) - Abnormal; Notable for the following components:   Bacteria, UA MANY (*)    All other components within normal limits  URINE CULTURE    EKG None  Radiology Dg Chest 2 View  Result Date: 08/12/2017 CLINICAL DATA:  Productive cough EXAM: CHEST - 2 VIEW COMPARISON:  05/17/2017 FINDINGS: Right-sided central venous port tip over the SVC. Hyperinflation. No pleural effusion. Possible small focus of airspace disease in the right mid lung.  Cardiomediastinal silhouette within normal limits. Aortic atherosclerosis. No pneumothorax. IMPRESSION: Possible small focus of airspace disease/inflammatory infiltrate in the right mid lung. Electronically Signed   By: Donavan Foil M.D.   On: 08/12/2017 18:01    Procedures Procedures (including critical care time)  Medications Ordered in ED Medications  sodium chloride 0.9 % bolus 500 mL (0 mLs Intravenous Stopped 08/12/17 1932)  cefTRIAXone (ROCEPHIN) 1 g in sodium chloride 0.9 % 100 mL IVPB (0 g Intravenous Stopped 08/12/17 1931)     Initial Impression / Assessment and Plan / ED Course  I have reviewed the triage vital signs and the nursing notes.  Pertinent labs & imaging results that were available during my care of the patient were reviewed by me and considered in my medical decision making (see chart for details).  77 year old male with a h/o of pancreatic adenocarcinoma, HTN, recurrent UTIs, and HTN presenting from his PCP's office for evaluation for home hospice and dehydration.  The patient was seen and evaluated along with Dr. Eulis Foster, attending physician.  Patient is mildly tachycardic in the low 100s.  After reviewing the patient's record, it appears that he has been tachycardic during his last few appointments.  Labs are otherwise reassuring with creatinine of 0.84.  Transaminases are within normal limits, significantly improved from previous.  UA is pending at this time.  IV fluid bolus given.  Patient declines pain control at this time.  Clinical Course as of Aug 12 2340  Mon Aug 12, 2017  1752 Patient reevaluation.  Discussed lab results with the patient's wife and daughter.  The patient's wife reports that she is interested in obtaining home hospice for the patient.  She also reports that she is interested in seeking a second opinion with oncology for his cancer.  While in the room, the patient is attempting to use the urinal.  As I attempted to help position the urinal,  purulent drainage was noted on the patient's abdomen and draining from the urethral meatus.  Will order IV Rocephin based on previous cultures and sensitivities from patient's previous UTIs.  No CVA tenderness bilaterally.   [MM]    Clinical Course User Index [MM] McDonald, Mia A, PA-C   UA with turbid appearance, large hemoglobinuria, moderate leukocyte esterase, nitrite positive, bilirubinuria and 3+ proteinuria.  Previous culture and sensitivities were reviewed.  The patient and his wife prefer home management as opposed to hospitalization.  We will discharge the patient with a course of Keflex.  Chest x-ray with a possible small focus of airspace, inflammatory infiltrate in the right midlung.  Will cover the patient with doxycycline.  On exam, the patient also has thrush on the dorsum of the tongue.  Will provide the patient with oral nystatin.  Family is requesting lidocaine gel for pain control.  Consulted social work regarding home hospice.  Spoke with Mariann Laster who has spoke with the patient's wife at length.  She reports that home hospice will be in contact with the patient and his family tomorrow for an assessment.  Discussed with the patient's wife that he should have a repeat urinalysis after he completes his course of antibiotics for reevaluation.  Final Clinical Impressions(s) / ED Diagnoses   Final diagnoses:  Acute cystitis with hematuria  Thrush, oral  Dehydration    ED Discharge Orders         Ordered    nystatin (MYCOSTATIN) 100000 UNIT/ML suspension  4 times daily     08/12/17 2132    doxycycline (VIBRAMYCIN) 100 MG capsule  2 times daily     08/12/17 2132    lidocaine (LIDODERM) 5 %  Every 24 hours,   Status:  Discontinued     08/12/17 2132    cephALEXin (KEFLEX) 500 MG capsule  2 times daily     08/12/17 2132    lidocaine (XYLOCAINE) 5 % ointment  As needed     08/12/17 2138           Joanne Gavel, PA-C 08/12/17 2342    Daleen Bo, MD 08/13/17 1053

## 2017-08-12 NOTE — ED Notes (Signed)
Bed: WA14 Expected date:  Expected time:  Means of arrival:  Comments: RES B 

## 2017-08-12 NOTE — ED Notes (Signed)
Gave pt water to encourage retrieving UA sample.

## 2017-08-12 NOTE — ED Notes (Signed)
Patient transported to X-ray 

## 2017-08-14 ENCOUNTER — Telehealth: Payer: Self-pay | Admitting: *Deleted

## 2017-08-14 LAB — URINE CULTURE

## 2017-08-14 NOTE — Telephone Encounter (Signed)
11:00 am  Received phone call from [place], [place] with [facility]. She called to report that patient went to see his PCP Monday and was then sent to [facility]. He was treated there for UTI, oral thrush and dehydration and returned home. Davy Pique, RN also states that hospital physician sent in referral for Hospice services.  [place] will be going out there today @ 2:30 pm for admission visit  This was a courtesy call to let us know the pt's status.

## 2017-08-16 ENCOUNTER — Other Ambulatory Visit: Payer: Self-pay

## 2017-08-16 ENCOUNTER — Telehealth: Payer: Self-pay

## 2017-08-16 MED ORDER — MEGESTROL ACETATE 40 MG/ML PO SUSP: 200.0000 mg | Freq: Two times a day (BID) | ORAL | 0 refills | Status: AC

## 2017-08-16 NOTE — Telephone Encounter (Signed)
Spoke with pt wife. Informed wife that MD is aware of recent ED visit and Hospice admission. Questioned if pt would like to reschedule an appt with Dr. Benay Spice. Wife states "tell me what Dr. Benay Spice could do for [place]?". This RN informed wife that we can refill medications, give advice, recommendations as needed. Pt wife states that pt "gets dehydrated and he needs fluids". This RN stated that pt would need to set up an appt with Dr. Benay Spice to discuss that further. Pt wife states "well we can get him there, he just can't sit and wait for long periods of time". This RN informed that our exact wait times are to be determined based upon the flow of the day. Pt wife voiced understanding and states "well unless Dr. Benay Spice has recommendations other than what I'm hearing, then we're fine". This RN voiced understanding, pt/wife will call back if needed. MD made aware.

## 2017-09-01 DEATH — deceased

## 2018-08-02 IMAGING — XA IR INT-EXT BILIARY DRAIN W/ CHOLANGIOGRAM
4 series · 12 of 16 positions shown · non-contrast
Comparison: none

INDICATION: Pancreatic carcinoma and biliary obstruction with inability to
replace a biliary stent endoscopically in the common bile duct.
Percutaneous biliary drainage now needed to relieve biliary
obstruction and jaundice.

[Series 1: fl - angio · 3 of 242 frames shown (1 of 3)]
[frame 19/242]
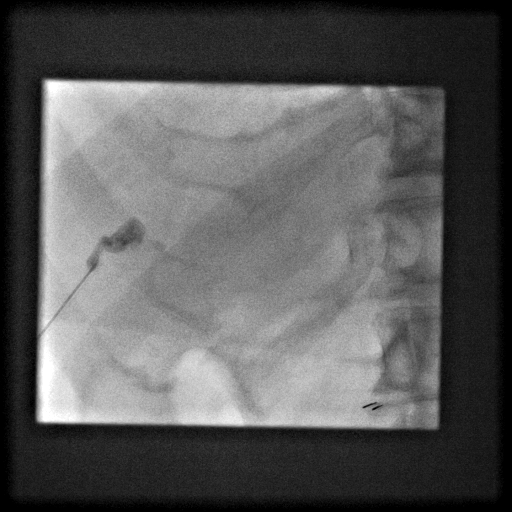
[frame 122/242]
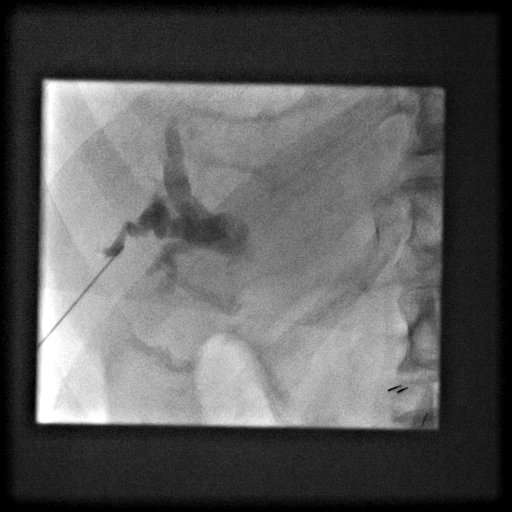
[frame 206/242]
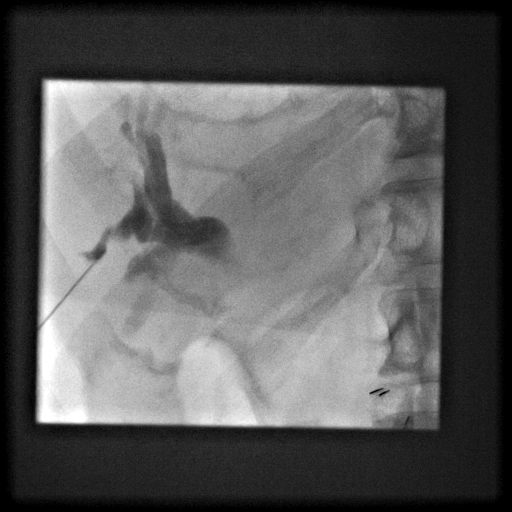

[Series 2: fl - angio · 3 of 56 frames shown (2 of 3)]
[frame 1/56]
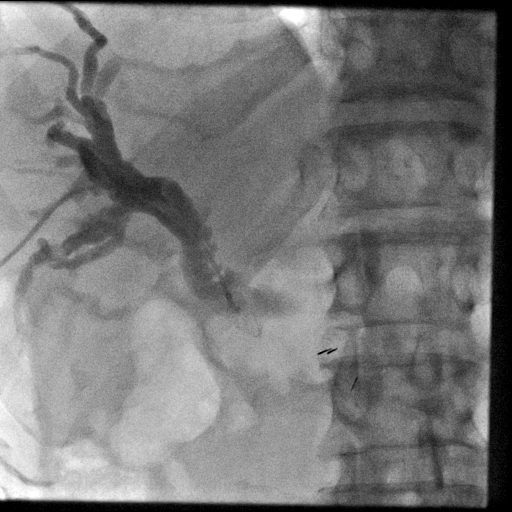
[frame 29/56]
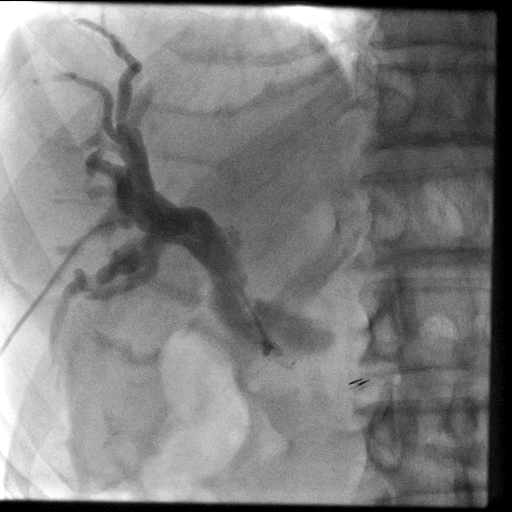
[frame 48/56]
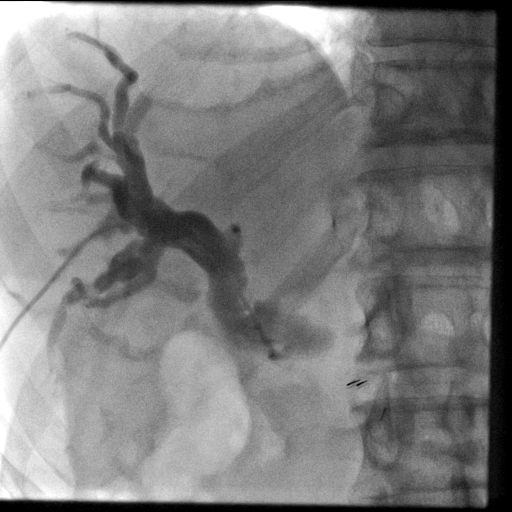

[Series 3: fl - angio · 3 of 31 frames shown (3 of 3)]
[frame 1/31]
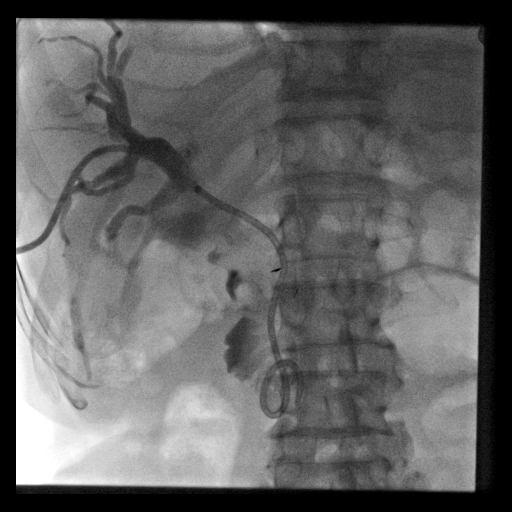
[frame 16/31]
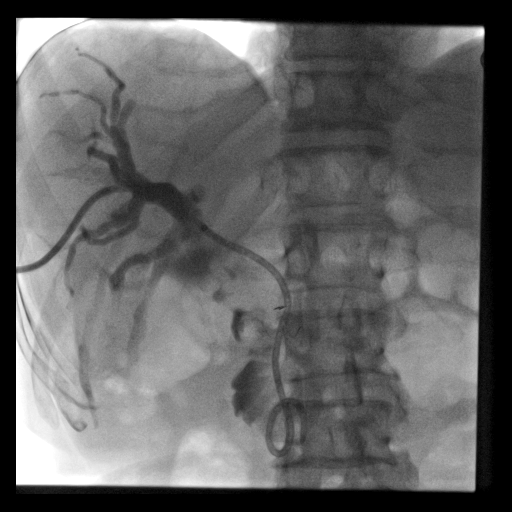
[frame 27/31]
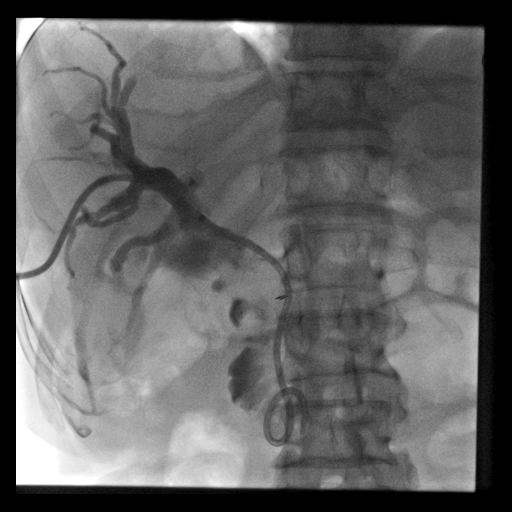

[Series 300: tube placements · 3 of 4 slices shown]
[im 1/4]
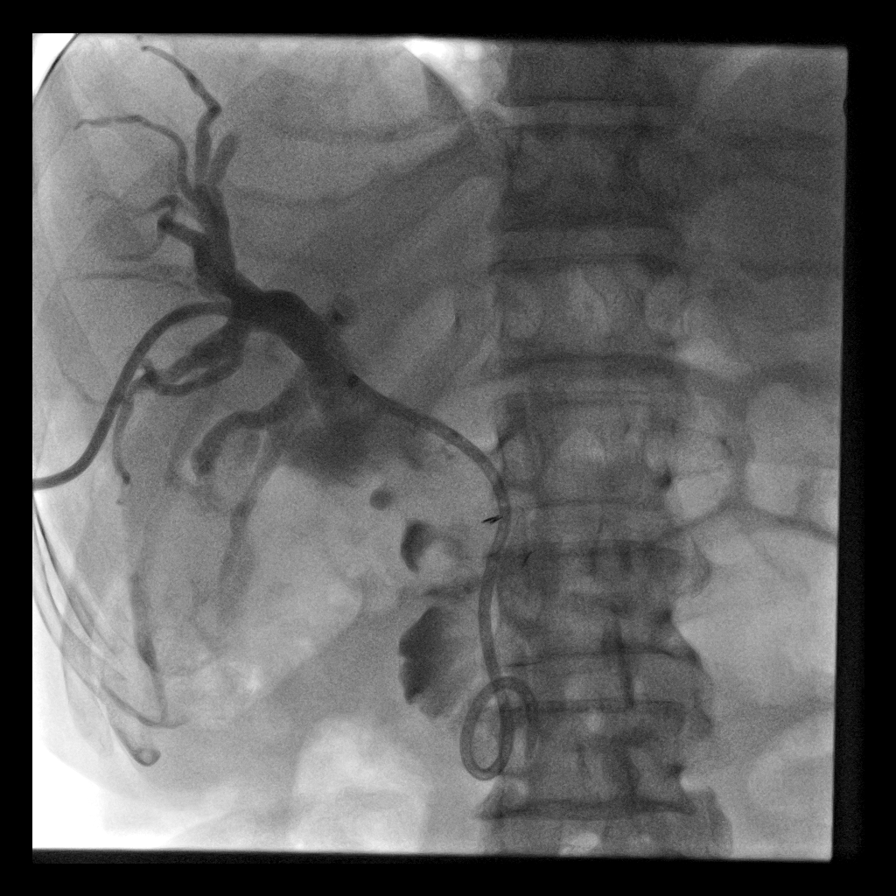
[im 3/4]
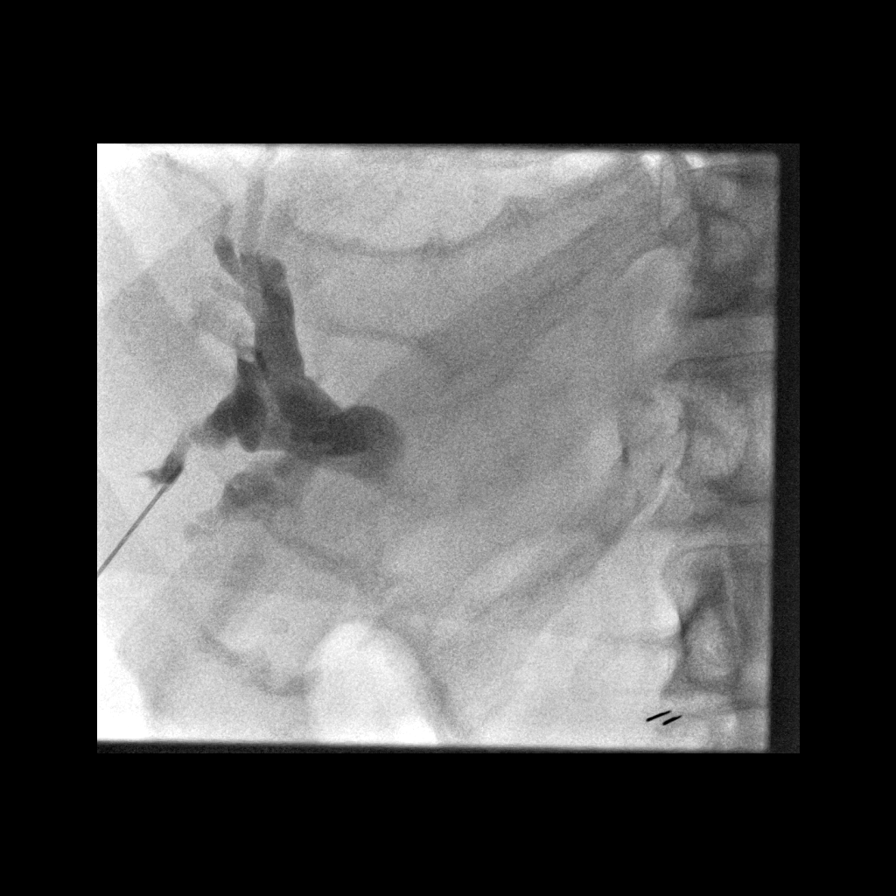
[im 4/4]
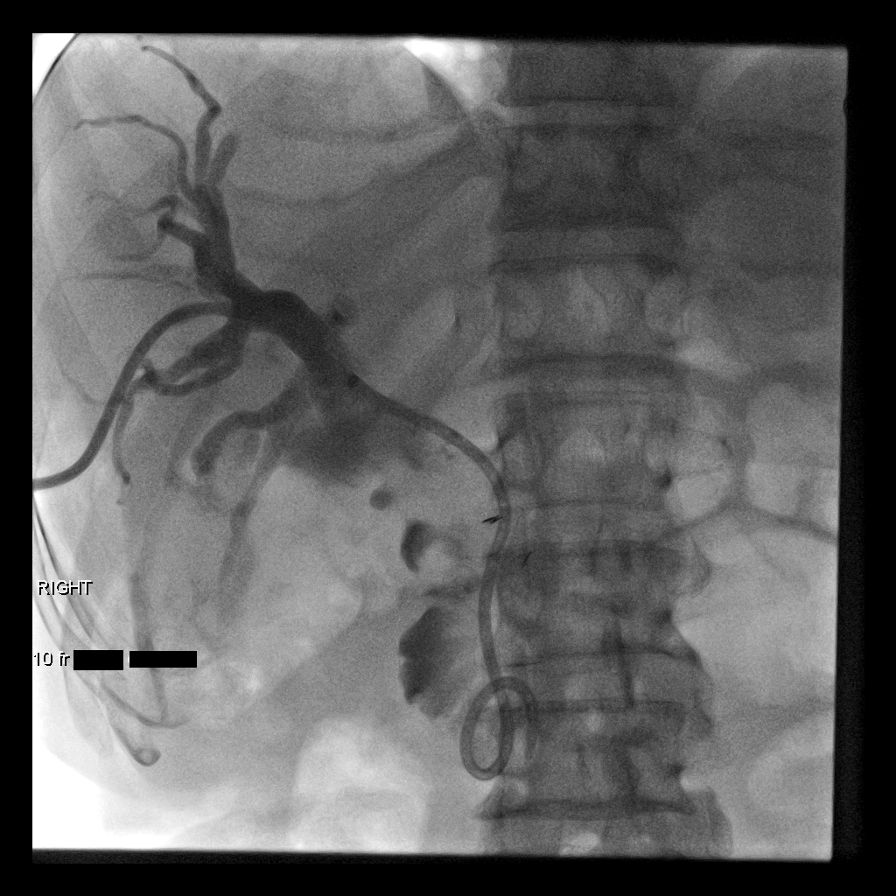

[12 of 16 positions shown; findings below may reference images not displayed]

EXAM:
PERCUTANEOUS BILIARY DRAINAGE WITH PLACEMENT OF INTERNAL/EXTERNAL
BILIARY DRAINAGE CATHETER INCLUDING CHOLANGIOGRAM

MEDICATIONS:
3.375 g IV Zosyn; The antibiotic was administered within an
appropriate time frame prior to the initiation of the procedure.

ANESTHESIA/SEDATION:
Moderate (conscious) sedation was employed during this procedure. A
total of Versed 2.0 mg and Fentanyl 100 mcg was administered
intravenously.

Moderate Sedation Time: 24 minutes. The patient's level of
consciousness and vital signs were monitored continuously by
radiology nursing throughout the procedure under my direct
supervision.

FLUOROSCOPY TIME:  Fluoroscopy Time: 3.0 minutes.  32.7 mGy.

CONTRAST:  20 mL Lsovue-MOO

COMPLICATIONS:
None immediate.

PROCEDURE:
Informed written consent was obtained from the patient after a
thorough discussion of the procedural risks, benefits and
alternatives. All questions were addressed. Maximal Sterile Barrier
Technique was utilized including caps, mask, sterile gowns, sterile
gloves, sterile drape, hand hygiene and skin antiseptic. A timeout
was performed prior to the initiation of the procedure.

Ultrasound was performed of the liver. Bile ducts in the right lobe
were localized by ultrasound. Under direct ultrasound guidance, a 21
gauge needle was advanced into a peripheral right lobe bile duct.
After return of bile, contrast was injected and a cholangiographic
fluoroscopic image obtained. A guidewire was advanced through the
needle. A transitional dilator was placed. Additional contrast was
injected at the level of the common bile duct through the dilator.
The dilator was removed over a wire. A 5 French catheter was
advanced over the wire.

Over a hydrophilic guidewire, the 5 French catheter was advanced
through a common bile duct stricture to the level of the duodenum.
The tract was then dilated over a guidewire and a 10 French
internal/external biliary drainage catheter advanced over the wire.
The distal portion of the catheter was formed at the level of the
duodenum. The catheter was injected with contrast material to
confirm position and flushed. The catheter was then connected to a
gravity drainage bag. It was secured at the skin with a Prolene
retention suture and adhesive StatLock device.
FINDINGS: After access of a right lobe bile duct, contrast injection and
cholangiogram demonstrate high-grade obstruction of the biliary tree
at the level of the common bile duct. A high-grade stricture is
present of the distal common bile duct. The stricture was able to be
crossed with a guidewire allowing eventual placement of a 10 French
internal/external biliary drainage catheter. After placement, the
catheter is draining bile with some internal debris.
IMPRESSION: Percutaneous biliary drainage procedure with placement of 10 French
internal/external biliary drainage catheter after right lobe biliary
access. Cholangiogram demonstrates a high-grade stricture of the
distal common bile duct which was able to be crossed with a
catheter. The biliary drainage catheter was attached to gravity bag
drainage.
